# Patient Record
Sex: Female | Born: 1935 | Race: White | Hispanic: No | Smoking: Former smoker
Health system: Southern US, Community
[De-identification: ages and names within clinical notes are randomized; demographics above are authoritative.]

## PROBLEM LIST (undated history)

## (undated) DIAGNOSIS — K635 Polyp of colon: Secondary | ICD-10-CM

## (undated) DIAGNOSIS — M419 Scoliosis, unspecified: Secondary | ICD-10-CM

## (undated) DIAGNOSIS — I509 Heart failure, unspecified: Secondary | ICD-10-CM

## (undated) DIAGNOSIS — E785 Hyperlipidemia, unspecified: Secondary | ICD-10-CM

## (undated) DIAGNOSIS — K2970 Gastritis, unspecified, without bleeding: Secondary | ICD-10-CM

## (undated) DIAGNOSIS — K5790 Diverticulosis of intestine, part unspecified, without perforation or abscess without bleeding: Secondary | ICD-10-CM

## (undated) DIAGNOSIS — I10 Essential (primary) hypertension: Secondary | ICD-10-CM

## (undated) DIAGNOSIS — K449 Diaphragmatic hernia without obstruction or gangrene: Secondary | ICD-10-CM

## (undated) DIAGNOSIS — N952 Postmenopausal atrophic vaginitis: Secondary | ICD-10-CM

## (undated) DIAGNOSIS — F5101 Primary insomnia: Secondary | ICD-10-CM

## (undated) HISTORY — DX: Diaphragmatic hernia without obstruction or gangrene: K44.9

## (undated) HISTORY — DX: Essential (primary) hypertension: I10

## (undated) HISTORY — DX: Diverticulosis of intestine, part unspecified, without perforation or abscess without bleeding: K57.90

## (undated) HISTORY — PX: TOTAL HIP ARTHROPLASTY: SHX124

## (undated) HISTORY — DX: Primary insomnia: F51.01

## (undated) HISTORY — PX: JOINT REPLACEMENT: SHX530

## (undated) HISTORY — DX: Hyperlipidemia, unspecified: E78.5

## (undated) HISTORY — DX: Polyp of colon: K63.5

## (undated) HISTORY — DX: Gastritis, unspecified, without bleeding: K29.70

## (undated) HISTORY — PX: OTHER SURGICAL HISTORY: SHX169

## (undated) HISTORY — DX: Postmenopausal atrophic vaginitis: N95.2

## (undated) HISTORY — DX: Scoliosis, unspecified: M41.9

---

## 1998-07-26 HISTORY — PX: OTHER SURGICAL HISTORY: SHX169

## 1998-08-05 ENCOUNTER — Ambulatory Visit (HOSPITAL_BASED_OUTPATIENT_CLINIC_OR_DEPARTMENT_OTHER): Admission: RE | Admit: 1998-08-05 | Discharge: 1998-08-05 | Payer: Self-pay | Admitting: Orthopaedic Surgery

## 1998-09-18 ENCOUNTER — Encounter: Admission: RE | Admit: 1998-09-18 | Discharge: 1998-11-24 | Payer: Self-pay | Admitting: Family Medicine

## 1999-07-15 ENCOUNTER — Other Ambulatory Visit: Admission: RE | Admit: 1999-07-15 | Discharge: 1999-07-15 | Payer: Self-pay | Admitting: Family Medicine

## 2000-07-15 ENCOUNTER — Inpatient Hospital Stay (HOSPITAL_COMMUNITY): Admission: RE | Admit: 2000-07-15 | Discharge: 2000-07-16 | Payer: Self-pay | Admitting: Orthopaedic Surgery

## 2001-03-02 ENCOUNTER — Other Ambulatory Visit: Admission: RE | Admit: 2001-03-02 | Discharge: 2001-03-02 | Payer: Self-pay | Admitting: Family Medicine

## 2002-02-19 ENCOUNTER — Encounter (INDEPENDENT_AMBULATORY_CARE_PROVIDER_SITE_OTHER): Payer: Self-pay | Admitting: *Deleted

## 2002-02-19 ENCOUNTER — Ambulatory Visit (HOSPITAL_COMMUNITY): Admission: RE | Admit: 2002-02-19 | Discharge: 2002-02-19 | Payer: Self-pay | Admitting: Gastroenterology

## 2003-08-22 ENCOUNTER — Other Ambulatory Visit: Admission: RE | Admit: 2003-08-22 | Discharge: 2003-08-22 | Payer: Self-pay | Admitting: Family Medicine

## 2005-08-30 ENCOUNTER — Other Ambulatory Visit: Admission: RE | Admit: 2005-08-30 | Discharge: 2005-08-30 | Payer: Self-pay | Admitting: Family Medicine

## 2006-03-26 LAB — HM COLONOSCOPY

## 2006-12-01 ENCOUNTER — Encounter: Payer: Self-pay | Admitting: Cardiology

## 2007-01-09 ENCOUNTER — Ambulatory Visit: Admission: RE | Admit: 2007-01-09 | Discharge: 2007-01-09 | Payer: Self-pay | Admitting: Specialist

## 2007-01-25 HISTORY — PX: OTHER SURGICAL HISTORY: SHX169

## 2007-02-02 ENCOUNTER — Inpatient Hospital Stay (HOSPITAL_COMMUNITY): Admission: RE | Admit: 2007-02-02 | Discharge: 2007-02-08 | Payer: Self-pay | Admitting: Specialist

## 2008-05-01 ENCOUNTER — Encounter
Admission: RE | Admit: 2008-05-01 | Discharge: 2008-06-26 | Payer: Self-pay | Admitting: Physical Medicine and Rehabilitation

## 2008-08-13 ENCOUNTER — Encounter: Payer: Self-pay | Admitting: Cardiology

## 2008-12-31 ENCOUNTER — Encounter: Payer: Self-pay | Admitting: Cardiology

## 2009-01-27 ENCOUNTER — Encounter: Admission: RE | Admit: 2009-01-27 | Discharge: 2009-01-27 | Payer: Self-pay | Admitting: Neurological Surgery

## 2009-02-10 DIAGNOSIS — I10 Essential (primary) hypertension: Secondary | ICD-10-CM | POA: Insufficient documentation

## 2009-02-10 DIAGNOSIS — K219 Gastro-esophageal reflux disease without esophagitis: Secondary | ICD-10-CM | POA: Insufficient documentation

## 2009-02-11 ENCOUNTER — Ambulatory Visit: Payer: Self-pay | Admitting: Cardiology

## 2009-02-11 DIAGNOSIS — E785 Hyperlipidemia, unspecified: Secondary | ICD-10-CM

## 2009-02-20 ENCOUNTER — Telehealth: Payer: Self-pay | Admitting: Cardiology

## 2009-02-24 ENCOUNTER — Ambulatory Visit: Payer: Self-pay | Admitting: Cardiology

## 2009-03-02 LAB — CONVERTED CEMR LAB
BUN: 19 mg/dL (ref 6–23)
GFR calc non Af Amer: 101.75 mL/min (ref >60)
Potassium: 4.4 meq/L (ref 3.5–5.1)

## 2009-03-03 ENCOUNTER — Telehealth: Payer: Self-pay | Admitting: Cardiology

## 2009-03-11 ENCOUNTER — Telehealth (INDEPENDENT_AMBULATORY_CARE_PROVIDER_SITE_OTHER): Payer: Self-pay | Admitting: *Deleted

## 2009-04-02 ENCOUNTER — Ambulatory Visit (HOSPITAL_COMMUNITY): Admission: RE | Admit: 2009-04-02 | Discharge: 2009-04-03 | Payer: Self-pay | Admitting: Neurological Surgery

## 2009-05-05 ENCOUNTER — Encounter: Admission: RE | Admit: 2009-05-05 | Discharge: 2009-05-05 | Payer: Self-pay | Admitting: Neurological Surgery

## 2009-07-14 ENCOUNTER — Encounter: Admission: RE | Admit: 2009-07-14 | Discharge: 2009-07-14 | Payer: Self-pay | Admitting: Neurological Surgery

## 2009-11-20 ENCOUNTER — Ambulatory Visit (HOSPITAL_COMMUNITY): Admission: RE | Admit: 2009-11-20 | Discharge: 2009-11-20 | Payer: Self-pay | Admitting: Ophthalmology

## 2009-12-23 ENCOUNTER — Encounter: Admission: RE | Admit: 2009-12-23 | Discharge: 2010-03-23 | Payer: Self-pay | Admitting: Family Medicine

## 2009-12-25 ENCOUNTER — Ambulatory Visit (HOSPITAL_COMMUNITY): Admission: RE | Admit: 2009-12-25 | Discharge: 2009-12-25 | Payer: Self-pay | Admitting: Ophthalmology

## 2010-07-11 LAB — HEMOGLOBIN AND HEMATOCRIT, BLOOD
HCT: 35.8 % — ABNORMAL LOW (ref 36.0–46.0)
Hemoglobin: 12 g/dL (ref 12.0–15.0)

## 2010-07-11 LAB — BASIC METABOLIC PANEL
CO2: 30 mEq/L (ref 19–32)
Calcium: 9.8 mg/dL (ref 8.4–10.5)
GFR calc non Af Amer: >60 mL/min (ref >60)
Sodium: 129 mEq/L — ABNORMAL LOW (ref 135–145)

## 2010-07-12 ENCOUNTER — Encounter: Payer: Self-pay | Admitting: Family Medicine

## 2010-07-12 DIAGNOSIS — I10 Essential (primary) hypertension: Secondary | ICD-10-CM | POA: Insufficient documentation

## 2010-07-12 DIAGNOSIS — M419 Scoliosis, unspecified: Secondary | ICD-10-CM | POA: Insufficient documentation

## 2010-07-12 DIAGNOSIS — K635 Polyp of colon: Secondary | ICD-10-CM | POA: Insufficient documentation

## 2010-07-12 DIAGNOSIS — F5101 Primary insomnia: Secondary | ICD-10-CM | POA: Insufficient documentation

## 2010-07-12 DIAGNOSIS — K449 Diaphragmatic hernia without obstruction or gangrene: Secondary | ICD-10-CM | POA: Insufficient documentation

## 2010-07-12 DIAGNOSIS — K219 Gastro-esophageal reflux disease without esophagitis: Secondary | ICD-10-CM | POA: Insufficient documentation

## 2010-07-12 DIAGNOSIS — R109 Unspecified abdominal pain: Secondary | ICD-10-CM | POA: Insufficient documentation

## 2010-07-12 DIAGNOSIS — K2970 Gastritis, unspecified, without bleeding: Secondary | ICD-10-CM | POA: Insufficient documentation

## 2010-07-12 DIAGNOSIS — R5383 Other fatigue: Secondary | ICD-10-CM | POA: Insufficient documentation

## 2010-07-12 DIAGNOSIS — K5790 Diverticulosis of intestine, part unspecified, without perforation or abscess without bleeding: Secondary | ICD-10-CM

## 2010-07-12 DIAGNOSIS — N952 Postmenopausal atrophic vaginitis: Secondary | ICD-10-CM | POA: Insufficient documentation

## 2010-07-28 LAB — CBC
HCT: 38 % (ref 36.0–46.0)
MCHC: 34.1 g/dL (ref 30.0–36.0)
MCV: 92.6 fL (ref 78.0–100.0)
RDW: 12.8 % (ref 11.5–15.5)

## 2010-07-28 LAB — DIFFERENTIAL
Basophils Relative: 0 % (ref 0–1)
Lymphocytes Relative: 29 % (ref 12–46)
Lymphs Abs: 1.5 10*3/uL (ref 0.7–4.0)
Monocytes Absolute: 0.5 10*3/uL (ref 0.1–1.0)
Monocytes Relative: 10 % (ref 3–12)
Neutro Abs: 3.1 10*3/uL (ref 1.7–7.7)
Neutrophils Relative %: 60 % (ref 43–77)

## 2010-07-28 LAB — PROTIME-INR: INR: 0.93 (ref 0.00–1.49)

## 2010-07-28 LAB — BASIC METABOLIC PANEL
BUN: 19 mg/dL (ref 6–23)
CO2: 28 mEq/L (ref 19–32)
Chloride: 95 mEq/L — ABNORMAL LOW (ref 96–112)
Creatinine, Ser: 0.58 mg/dL (ref 0.4–1.2)

## 2010-07-28 LAB — APTT: aPTT: 30 seconds (ref 24–37)

## 2010-08-31 ENCOUNTER — Encounter: Payer: Self-pay | Admitting: Internal Medicine

## 2010-08-31 ENCOUNTER — Ambulatory Visit (INDEPENDENT_AMBULATORY_CARE_PROVIDER_SITE_OTHER): Payer: Medicare Other | Admitting: Internal Medicine

## 2010-08-31 DIAGNOSIS — J45909 Unspecified asthma, uncomplicated: Secondary | ICD-10-CM | POA: Insufficient documentation

## 2010-08-31 MED ORDER — PREDNISONE (PAK) 10 MG PO TABS: ORAL_TABLET | ORAL | Status: AC

## 2010-08-31 MED ORDER — MOMETASONE FURO-FORMOTEROL FUM 200-5 MCG/ACT IN AERO: INHALATION_SPRAY | RESPIRATORY_TRACT | Status: DC

## 2010-08-31 NOTE — Progress Notes (Signed)
  Subjective:    Patient ID: [name], female    DOB: July 16, 1924, 77 y.o.   MRN: [id number]  HPI 17 yowf quit smoking as teenager and no adverse effects to her knowledge with dx of allergic rhinits/asthma by [facility] then Nathalie on shots since the 1990's  ? Benefit?   08/31/2010 Initial pulmonary office eval in [place] office with persistent adult onset (in her 30s) recurrent cough/ wheeze intermittent at least  Twice  A year , esp spring and fall or with colds,  Some  better with saba  Only Use intermittently with no need for rx between flares  but this episode started 4 weeks prior ot 1st pulmonary ov eval by Gene Renfrow  rx prednisone transiently improved but then worse again wit doe x adls and noct cough/ wheeze. No purulent sputum  Pt denies any significant sore throat, dysphagia, itching, sneezing,  nasal congestion or excess/ purulent secretions,  fever, chills, sweats, unintended wt loss, pleuritic or exertional cp, hempoptysis, orthopnea pnd or leg swelling.    Also denies any obvious fluctuation of symptoms with weather or environmental changes or other aggravating or alleviating factors.     Review of Systems     Objective:   Physical Exam    elderly wf < stated age Wt 132 08/31/2010  HEENT mild turbinate edema.  Oropharynx no thrush or excess pnd or cobblestoning.  No JVD or cervical adenopathy. Mild accessory muscle hypertrophy. Trachea midline, nl thryroid. Chest was hyperinflated by percussion with diminished breath sounds and moderate increased exp time without wheeze. Hoover sign positive at mid inspiration. Regular rate and rhythm without murmur gallop or rub or increase P2 or edema.  Abd: no hsm, nl excursion. Ext warm without cyanosis or clubbing.     Cbc 08/31/10 no eos Assessment & Plan:

## 2010-08-31 NOTE — Patient Instructions (Signed)
Start dulera 200 2 puffs every 12 hours whenever having any cough and wheeze or short  Work on inhaler technique:  relax and gently blow all the way out then take a nice smooth deep breath back in, triggering the inhaler at same time you start breathing in.  Hold for up to 5 seconds if you can.  Rinse and gargle with water when done   If your mouth or throat starts to bother you,   I suggest you time the inhaler to your dental care and after using the inhaler(s) brush teeth and tongue with a baking soda containing toothpaste and when you rinse this out, gargle with it first to see if this helps your mouth and throat.     Take omeprazole Take 30- 60 min before your first and last meals of the day and follow the diet  GERD (REFLUX)  is an extremely common cause of respiratory symptoms, many times with no significant heartburn at all.    It can be treated with medication, but also with lifestyle changes including avoidance of late meals, excessive alcohol, smoking cessation, and avoid fatty foods, chocolate, peppermint, colas, red wine, and acidic juices such as orange juice.  NO MINT OR MENTHOL PRODUCTS SO NO COUGH DROPS  USE SUGARLESS CANDY INSTEAD (jolley ranchers or Stover's)  NO OIL BASED VITAMINS   Prednisone 10 mg take  4 each am x 2 days,   2 each am x 2 days,  1 each am x2days and stop

## 2010-08-31 NOTE — Assessment & Plan Note (Addendum)
DDX of  difficult airways managment all start with A and  include Adherence, Ace Inhibitors(already established she can't use them), Acid Reflux, Active Sinus Disease, Alpha 1 Antitripsin deficiency, Anxiety masquerading as Airways dz,  ABPA,  allergy(esp in young), Aspiration (esp in elderly), Adverse effects of DPI,  Active smokers, plus two Bs  = Bronchiectasis and Beta blocker use..and one C= CHF   In this case Adherence is the biggest issue and starts with  inability to use HFA effectively and also  understand that SABA treats the symptoms but doesn't get to the underlying problem (inflammation).  I used  the analogy of putting steroid cream on a rash to help explain the meaning of topical therapy and the need to get the drug to the target tissue.   The proper method of use, as well as anticipated side effects, of this metered-dose inhaler are discussed and demonstrated to the patient. This improved from < 25% to 50% at best and needs work, lots of it  ? Acid reflux >  Reviewed ppi timing/ diet  ? Allergies> really seriously doubt shots are helping after 20 years and may be able to stop them if can just teach effective topical rx.  Will need close f/u and short course of prednisone to get over this exacerbation.  Strongly doubt copd here

## 2010-09-08 NOTE — Op Note (Signed)
[name], [name]              ACCOUNT NO.:  [id number]   MEDICAL RECORD NO.:  [id number]          PATIENT TYPE:  INP   LOCATION:  1608                         FACILITY:  [facility]   PHYSICIAN:  [name], M.D.DATE OF BIRTH:  07/21/1924   DATE OF PROCEDURE:  02/02/2007  DATE OF DISCHARGE:                               OPERATIVE REPORT   PREOPERATIVE DIAGNOSIS:  Right knee end-stage osteoarthritis.   POSTOPERATIVE DIAGNOSIS:  Right knee end-stage osteoarthritis.   PROCEDURE:  Right total knee arthroplasty.   SURGEON:  [name], M.D.   ASSISTANT:  Leilani Able, PA-C.   ANESTHESIA:  Spinal with monitored anesthesia care.   ESTIMATED BLOOD LOSS:  Less 100 mL.   DRAINS:  One medium Hemovac.   COMPLICATIONS:  None.   TOURNIQUET TIME:  75 minutes at 300 mmHg.   DISPOSITION:  PACU stable.   OPERATIVE IMPLANTS:  [employer], [employer], [place].  Size 3 femur,  size 3 tibia, 12.5-mm posterior stabilized rotating platform tibial  insert.  35-mm all polyethylene patella.  All cemented.   OPERATIVE DETAILS:  The patient was counseled in the holding area.  The  correct site was identified.  IV was started.  She was taken to the  operating room and placed under monitored anesthesia care where a spinal  was administered.  Utilizing sterile technique by the OR circulating  nurse, the Foley catheter was placed.  All extremities were well-padded.  The right knee was examined.  She had 5-degree flexion contracture.  She  could flex to 120 degrees.  She was elevated, prepped with DuraPrep and  draped in a sterile fashion.  It was exsanguinated with an Esmarch, and  the tourniquet was inflated to 300 mmHg.  She had some previous  abrasions on the anterior aspect of her knee. The anterior midline  incision was made, avoiding these.  Dissection of the skin and the  subcutaneous tissue.  Medial and lateral soft tissue flaps were  developed.  Medial parapatellar  arthrotomy was performed.  Proximal  medial soft tissue release was slightly done.  End-stage arthritis  changes, bone against bone, more so in the lateral compartment.  The  cruciate ligaments were resected.  Starter hole made in the distal  femur.  The canal was irrigated.  The effluent was clear.  Intramedullary rod  was gently placed.  I chose a 5-degree valgus cut  and took a 10-mm cut off the distal femur.  The distal femur was found  be a size #3.  Rotation marks were made and cut to fit a size #3.  Proximal tibia eminence was resected.  Medial and lateral menisci  removed.  The geniculate vessels were coagulated.  The posterior  neurovascular structures were thought of and protected throughout the  entire case.  The tibia was found to be a size 3.  The central aspect  was identified.  Reaming and step reaming was performed.  Rod was  placed.  I took an 8-mm cut off the lateral side which appeared to be  about 2-mm of defect.  This was done at a  0-degree slope.  Posteromedial  and posterolateral femoral osteophytes were removed.  At this time, the  flexion-extension blocks with 12.5 were well-balanced.  The knee was  then flexed.  Tibial base plate was applied.  Rotation __________ were  set, reamer and punch were performed.  The femoral box cut was then  performed.  At this time, a size 3 tibia, size 3 femur, 12.5-mm tibial  insert with excellent motion, soft tissue balance.  Patella tracked it  was anatomic.  Patella was found be a size 35.  __________ bone was  removed and locking holes were made.  At this time, with pulsatile  lavage, the knee was copiously irrigated.  Utilizing Moder cement  technique, all components were cemented in place, size 3 tibia, size 3  femur, 35 patella.  After the cement had cured, we did a 12.5 trial  insert, and we had excellent range of motion and soft tissue balance in  varus and valgus stress.  The trials were removed.  Excess cement was   removed.  Again, the knee was irrigated.  The final 12.5-mm posterior  stabilized rotating platform tibial insert was implanted.  A medium  Hemovac drain was placed.  Sequential closure of layers were done.  The  arthrotomy was closed with Vicryl, subcu Vicryl, skin with subcuticular  Monocryl suture.  Steri-Strips were applied.  Drain hooked to suction.  Sterile dressing applied.  The tourniquet was deflated.  Another gram of  Ancef was given intravenously.  No complications or problems.  Sponge  and needle count were correct.  She had excellent circulation in the  foot and ankle at the end of the case.  She was taken out of the  operating room in stable condition.   To help with surgical decision making and technique, Mr. Leilani Able,  PA-C assisted me throughout the entire case.           ______________________________  [name], M.D.     RAC/MEDQ  D:  02/02/2007  T:  02/03/2007  Job:  [id number]

## 2010-09-08 NOTE — Discharge Summary (Signed)
NAMESHARISSA, [name]              ACCOUNT NO.:  [id number]   MEDICAL RECORD NO.:  [id number]          PATIENT TYPE:  INP   LOCATION:  1608                         FACILITY:  [facility]   PHYSICIAN:  [name], M.D.DATE OF BIRTH:  March 24, 1925   DATE OF ADMISSION:  02/02/2007  DATE OF DISCHARGE:  02/07/2007                               DISCHARGE SUMMARY   ADMISSION DIAGNOSIS:  End-stage osteoarthritis, right knee.   DISCHARGE DIAGNOSES:  1. End-stage osteoarthritis, right knee.  2. Hyponatremia.  3. Acute postoperative blood loss anemia.   PROCEDURE:  Total knee arthroplasty, right knee.   HISTORY OF PRESENT ILLNESS:  This is a 77 year old lady with a history  of end-stage osteoarthritis of her right knee that has failed  conservative management.  Due to her continued pain and difficulty, she  is now scheduled for total knee arthroplasty.  The surgery, risks,  benefits and aftercare were discussed in detail with the patient,  questions provided and answered and surgery to go ahead as scheduled.   LABORATORY DATA AND X-RAY FINDINGS:  Admission CBC showed hematocrit  slightly low at 35.9, monocytes high at 12 and otherwise normal.  Admission CMET showed sodium low at 133, otherwise within normal limits.  PT/PTT within normal limits.  Urinalysis within normal limits.  Hemoglobin and hematocrit reached a low of 7.5 and 21.4 on October 11,  and she was transfused 2 units of packed cells and on October 13, was  back up to 9.9 and 28.7.  On October 12, she was hyponatremic at 129 and  hypochloremic at 81.  She was put on fluid restriction and on October  13, was back up to 132 for a sodium with chloride of 93.   HOSPITAL COURSE:  The patient tolerated the operative procedure well.  On postop day #1, vital signs were stable and afebrile with O2  saturations 97% on 2 L.  I's & O's good, but diuresing significantly.  Hemoglobin was 9.5, hematocrit 27.8.  Glucose was 189 with  potassium  5.5.  This was repeated and found to be normal.  This was probably a  hemolyzed specimen.  Her drain was removed and her CPM and physical  therapy were started postop day #2.  Vital signs were stable.  She was  afebrile.  Calves were negative and wound was benign.  Her hemoglobin  was down to 7.9 and she was transfused 2 units of packed cells.  On  postop day #3, vital signs were stable.  She was afebrile.  Her dressing  was changed and her wound was benign.  She was put on fluid restrictions  for her hyponatremia and plans were made for nursing home placement.  On  postop day #4, vital signs were stable and she was afebrile.  Lungs were  clear.  Heart sounds normal.  Bowel sounds active.  Calves were negative  and wound was benign.  Repeat labs showed her sodium to be back up to  132.  Plans were made for discharge the following morning pending stable  vital signs.   CONDITION ON DISCHARGE:  Improved.  DISCHARGE MEDICATIONS:  1. Metoprolol 50 mg b.i.d.  2. Nexium 40 mg q.a.m.  3. Mucinex 600 mg one at night.  4. Calcium 600 mg plus vitamin D one daily.  5. Albuterol inhaler p.r.n.  6. Allergy shots every other Monday.  7. Robaxin 500 mg one p.o. q.8h. p.r.n. spasm.  8. Senokot one p.o. nightly.  9. Norco 5/325 one to two q.4-6 h. p.r.n. pain.  10.Ferrous sulfate 325 mg p.o. t.i.d.  11.Lovenox 30 mg one shot q.12 h. at 7 a.m. and 7 p.m. until February 12, 2007, at which time the Lovenox should stop.   CONDITION ON DISCHARGE:  Improved.   ACTIVITY:  She should be doing physical therapy for assisted ambulation  and range of motion of her knees.  She should do CPM machine 0-60  degrees 8 hours a day increasing by 5 degrees per day.  She can  weightbear as tolerated.   WOUND CARE:  She should keep her wound clean and dry x3 weeks.   FOLLOW UP:  Follow up in the office in 2 weeks.  Please call on the day  of discharge to set up appointment to see Dr. Thomasena Edis in 2  weeks.      [name], [name]    ______________________________  [name], M.D.    SJC/MEDQ  D:  02/06/2007  T:  02/06/2007  Job:  [id number]

## 2010-09-08 NOTE — H&P (Signed)
[name], [name]              ACCOUNT NO.:  [id number]   MEDICAL RECORD NO.:  [id number]          PATIENT TYPE:  INP   LOCATION:  NA                           FACILITY:  [facility]   PHYSICIAN:  [name], M.D.DATE OF BIRTH:  07-20-1924   DATE OF ADMISSION:  02/02/2007  DATE OF DISCHARGE:                              HISTORY & PHYSICAL   ADMISSION DIAGNOSIS:  End-stage osteoarthritis, right knee.   HISTORY OF PRESENT ILLNESS:  This is a 77 year old lady with a history  of end-stage osteoarthritis of her right knee which has failed  conservative treatment to alleviate her pain.  Due to continued pain and  difficulty, she now requests total knee arthroplasty.  She has had  medical clearance from her medical doctor and the surgery, risk,  benefits and aftercare were discussed in detail with the patient and her  daughter.  Questions invited and answered and surgery to go ahead as  scheduled.   ALLERGIES:  No known drug allergies.   MEDICATIONS:  1. Nexium 40 mg one daily.  2. Metoprolol 50 mg one b.i.d.   PAST MEDICAL HISTORY:  1. Gastroesophageal reflux disease.  2. Hypertension.   PAST SURGICAL HISTORY:  Shoulder surgery.   SOCIAL HISTORY:  The patient lives at home.  She is single.  She does  not smoke and drinks rarely.   FAMILY HISTORY:  Positive for heart disease, CVA and cancer.   REVIEW OF SYSTEMS:  CENTRAL NERVOUS SYSTEM:  Negative for headache,  blurred vision or dizziness.  PULMONARY:  No shortness breath, PND,  orthopnea.  CARDIOVASCULAR:  No chest pain or palpitations.  GI:  Positive for GERD.  Negative for ulcers or hepatitis.  GU: Negative for  urinary tract difficulty.  MUSCULOSKELETAL:  Positive as in HPI.   HISTORY OF PRESENT ILLNESS:  VITAL SIGNS:  BP 150/88, respirations 16,  pulse 64 and regular.  GENERAL:  This is a well-developed, well-nourished lady in no acute  distress.  HEENT:  Head normocephalic.  Nose patent.  Ears patent.  Pupils  equal,  round, reactive to light.  Throat without injection.  NECK:  Supple without adenopathy.  Carotids 2+ without bruit.  CHEST:  Clear to auscultation.  No rales or rhonchi.  Respirations 16.  HEART:  Regular rate and rhythm at 64 beats per minute without murmur.  ABDOMEN:  Soft, active bowel sounds.  No masses, organomegaly.  NEUROLOGIC:  The patient is alert and oriented to time, place and  person.  Cranial nerves 2-12 grossly intact.  EXTREMITIES:  The right knee with 0- to 135-degree range of motion,  positive crepitation throughout the range of motion.  NEUROLOGIC:  Dorsalis pedis and posterior tibialis pulses are 2+.  She  has a healing abrasion on the anterior knee without evidence of  infection.   LABORATORY DATA AND X-RAY FINDINGS:  X-rays show end-stage  osteoarthritis of right knee.   IMPRESSION:  End-stage osteoarthritis, right knee.   PLAN:  Total knee arthroplasty, right knee.      [name], [name]    ______________________________  [name], M.D.  SJC/MEDQ  D:  01/23/2007  T:  01/24/2007  Job:  [id number]

## 2010-09-11 NOTE — Discharge Summary (Signed)
[name], [name]              ACCOUNT NO.:  [id number]   MEDICAL RECORD NO.:  [id number]          PATIENT TYPE:  INP   LOCATION:  1608                         FACILITY:  [facility]   PHYSICIAN:  [name], M.D.DATE OF BIRTH:  1924-10-11   DATE OF ADMISSION:  02/02/2007  DATE OF DISCHARGE:  02/08/2007                               DISCHARGE SUMMARY   ADMITTING DIAGNOSIS:  End-stage osteoarthritis, right knee.   DISCHARGE DIAGNOSIS:  End-stage osteoarthritis, right knee.   OPERATION:  Total knee arthroplasty, right knee.   BRIEF HISTORY:  This is a 81-year lady with a history end-stage  osteoarthritis right knee that failed conservative treatment to  alleviate her pain and discomfort due to continued difficulties with  daily activities.  She is scheduled for total knee arthroplasty.  Surgery risks, benefits and aftercare were discussed in detail with the  patient.  Questions invited and answered.  She had medical clearance  from Dr. Rudi Heap, her family doctor, prior to surgery.  Surgery  will go ahead as scheduled.   LABORATORY VALUES:  Admission CBC within normal limits with the  exception of slightly low hematocrit at 35.9.  Hemoglobin/hematocrit  reached a low of 7.5 and 21.4.  On October 11, she was transfused and  was back up to 9.9 and 28.7 by discharge.  PT/PTT within normal limits.  She ran mildly low sodium through admission at 129 and up to 132 at  discharge.  Glucose was elevated throughout admission from the 138-189  range and was 108 at discharge.  Urinalysis was normal.   COURSE IN THE HOSPITAL:  The patient tolerated the operative procedure  well.  Postoperative day #1 , vital signs were stable.  She was  afebrile.  Hemoglobin/hematocrit were at acceptable levels.  Lungs were  clear.  Heart sounds normal.  Bowel sounds active.  Calves negative.  Dressing was dry.  Drain was removed without difficulty, and CPM and  therapy were started.   Postoperative day #2 vital signs were stable.  She was afebrile.  Dressing was changed.  Wound was benign.  Hemoglobin/hematocrit had dropped below 8 and 22.  She was transfused 2  units.  Her calves were negative, and she continued with physical  therapy.  Postoperative day #3, vital signs were stable.  She was  afebrile.  She was slightly hyponatremic.  She was put on free water  restriction for that.  Her dressing was changed.  Her wound was benign.  Calves were soft and negative.  Plans were made for discharge to skilled  nursing facility the following day or two.  Postoperative day #4, she  was feeling better.  Vital signs stable, afebrile.  Lungs were clear.  Heart sounds normal.  Bowel sounds active.  Calves were negative.  Wound  was benign, and continued awaiting nursing home placement.  On  postoperative day #5, she continued do well.  Vital signs stable.  She  was afebrile.  Lungs clear.  Heart sounds normal.  Bowel sounds active.  Calves negative.  Wound was benign.  Hyponatremia had resolved and  nursing facility was found  and the patient was subsequently discharged  to skilled nursing facility.   CONDITION ON DISCHARGE:  Improved.   DISCHARGE MEDICATIONS:  1. Norco 5/325 one to two q. 4-6 h p.r.n. pain.  2. Robaxin 500 one p.o. q.8 h p.r.n. spasm.  3. Trinsicon one b.i.d. for anemia.  4. Lovenox 30 mg one shot q.12 h for total of 10 days postop.   DISCHARGE INSTRUCTIONS:  1. She is to be weight bear as tolerated.  Do a range of motion and      CPM daily.  Ambulate with a walker.  2. Follow-up in the office 2 weeks with Dr. Thomasena Edis.  3. Discharge diet regular.      [name], [name]    ______________________________  [name], M.D.    SJC/MEDQ  D:  03/01/2007  T:  03/02/2007  Job:  [id number]

## 2010-09-11 NOTE — Op Note (Signed)
[place]. [facility]  Patient:    [name], [name]                       MRN: [id number] Proc. Date: 07/15/00 Adm. Date:  [id number] Attending:  Jacki Cones                           Operative Report  PREOPERATIVE DIAGNOSIS:  Right recurrent rotator cuff tear.  POSTOPERATIVE DIAGNOSIS:  Right recurrent rotator cuff tear.  PROCEDURE:  Right shoulder exploration and subacromial bursectomy, repeat acromioplasty, and partial rotator cuff resection.  SURGEON:  Mark C. Ophelia Charter, M.D.  ASSISTANT:  Humberto Leep. Wyonia Hough, M.D.  ANESTHESIA:  Scalene block plus GOT.  ESTIMATED BLOOD LOSS:  150 ml.  DESCRIPTION OF PROCEDURE:  After induction of general anesthesia with preoperative scalene block preoperative Ancef prophylaxis, standard Duraprep to the shoulder as well as the iliac crest with a Sure-Foot to help block the heel with flexion and internal rotation, both the right thigh and shoulder region and arm to the wrist were prepped with Duraprep.  The thigh was squared off with towels, Betadine Vi-Drape applied for planned iliotibial graft if necessary.  Shoulder had stockinette applied with Coban and a Betadine Vi-Drape.  Usual split sheet and drapes were applied.  Old incision was opened.  Vicryl sutures placed 1.5 cm off the tip of the acromion in the deltoid split to prevent further migration.  Anterior portion of the deltoid was released with the Bovie from the acromion. Immediately the humeral head was directly visualized.  There was granulation tissue present, which was debrided, and hypertrophic bursa fluid, which was resected.  There was some anterior cuff present with the low portion of the subscapularis still attached.  Supraspinatus was completely absent.  Posterior portion of the cuff was also gone.  Some bursa resected, and the tip of the cuff was identified back posterior to the glenoid rim.  This was grasped, a suture placed in it, and for  20-30 minutes finger dissection performed in all portions of the cuff to attempt to mobilize the cuff.  The cuff was stuck down and despite dissection, tissue was poor.  There was no _____.  The posterior aspect of the anterior cuff was released from some scar tissue bands between it and the overlying deltoid and pectoralis muscle.  With the arm abducted, there was no area of any remaining attachment of the rotator cuff to the greater tuberosity at the posterior aspect.  Repeat acromioplasty was performed using a three-quarter inch osteotome and a rasp, and there was some tissue stuck to the undersurface of the acromion, which was thick and hypertrophic and was wispy and with a small amount of pulling would tear. Initially this was thought to possibly be part of the rotator cuff, but as it was mobilized it was obvious that it was some granulation tissue that was present mixed with bursal tissue and not cuff.  Biceps tendon was gone.  The old area where the rotator cuff had been advanced and repaired on the articular cartilage with the area of bone that had been roughened up was visualized.  Labrum was inspected with slight distraction of the arm, and there was no obvious labral tear.  Continued mobilization was performed in an attempt to help to advance the rotator cuff, but it became obvious that over the anterior portion of the rotator cuff had some attachment and that it was  significantly limited in its excursion and that the supraspinatus and all aspects of the posterior cuff were gone, extremely retracted, and would not mobilize more than a few millimeters past the point of the rim of the glenoid. All hypertrophic tissue had been cut loose.  Complete bursectomy was performed.  Undersurface of the acromion was thinned a little further, polished down with a rasp, the distal aspect of the clavicle trimmed with a rongeur, filed down smooth, and after irrigation the deltoid was  re-repaired to the acromion.  Vicryl 2-0 was put in the subcutaneous tissue, skin staple closure, postoperative dressing and shoulder immobilizer.  The patient tolerated the procedure well. DD:  07/15/00 TD:  07/16/00 Job: [id number]

## 2010-09-11 NOTE — Op Note (Signed)
[name], [name]                          ACCOUNT NO.:  [id number]   MEDICAL RECORD NO.:  [id number]                   PATIENT TYPE:  AMB   LOCATION:  ENDO                                 FACILITY:  [facility]   PHYSICIAN:  Petra Kuba, M.D.                 DATE OF BIRTH:  07-06-1924   DATE OF PROCEDURE:  02/19/2002  DATE OF DISCHARGE:                                 OPERATIVE REPORT   PROCEDURE:  Esophagogastroduodenoscopy with biopsy.   INDICATIONS:  Patient with long-standing reflux.  Want to proceed with a one-  time endoscopy to rule out Barrett's or other abnormalities.  Consent was  signed after risks, benefits, methods, and options thoroughly discussed in  the office.   MEDICATIONS:  Additional medicines for this procedure:  Demerol 20 mg,  Versed 2 mg.   DESCRIPTION OF PROCEDURE:  The video endoscope was inserted by direct  vision.  Esophagus was normal.  There was no sign of esophagitis or  Barrett's; however, in the distal esophagus a small to medium-sized hiatal  hernia was seen with a widely patent ring.  The scope passed easily into the  stomach, advanced through a normal pylorus to a normal duodenal bulb and  around the C-loop to a normal second portion of the duodenum.  The scope was  then slowly withdrawn, and again a good look at the bulb was normal.  The  scope was withdrawn back to the stomach and retroflexed.  Angularis, cardia,  fundus, lesser and greater curve were normal except for some moderate distal  gastritis and antritis.  A few scattered biopsies were obtained.  Air was  suctioned, the scope was slowly withdrawn.  Again a good look at the  esophagus on slow withdrawal was normal.  Not mentioned above, straight  visualization of the stomach was done, which ruled out any other additional  findings.  The scope was removed again, and a good look at the esophagus was  normal.  The scope was removed.  The patient tolerated the procedure well.  There was  no obvious immediate complication.   ENDOSCOPIC DIAGNOSES:  1. Small to medium-sized hiatal hernia with a widely patent ring.  2. Moderate antritis, distal gastritis, status post biopsy.  3. Otherwise normal EGD.   PLAN:  Continue her Nexium, as that seems to be helping.  Await pathology.  Return care to Dr. Christell Constant for the customary health care maintenance to  include yearly rectals and guaiacs.  I will be on standby to help p.r.n.                                               Petra Kuba, M.D.    MEM/MEDQ  D:  02/19/2002  T:  02/19/2002  Job:  [id number]  cc:   Ernestina Penna, M.D.  [address] [place]  [place] [address]  Fax: [phone]

## 2010-09-11 NOTE — Op Note (Signed)
[name], [name]                          ACCOUNT NO.:  [id number]   MEDICAL RECORD NO.:  [id number]                   PATIENT TYPE:  AMB   LOCATION:  ENDO                                 FACILITY:  [facility]   PHYSICIAN:  Petra Kuba, M.D.                 DATE OF BIRTH:  1924-07-24   DATE OF PROCEDURE:  02/19/2002  DATE OF DISCHARGE:                                 OPERATIVE REPORT   PROCEDURE PERFORMED:  Colonoscopy with polypectomy.   INDICATIONS FOR PROCEDURE:  Screening.  Consent was signed after risks,  benefits, methods, and options were thoroughly discussed in the office.   MEDICINES USED:  Demerol 50, Versed 5.   DESCRIPTION OF PROCEDURE:  Rectal inspection was pertinent for external  hemorrhoids, small.  Digital exam was negative.  Video pediatric adjustable  colonoscope was inserted and advanced around the colon to the cecum.  This  did require rolling her on her back with some abdominal pressure on  insertion.  No significant abnormalities were seen.  The cecum was  identified by the appendiceal orifice and the ileocecal valve.  Unfortunately the prep on the right side was only fair.  There was still  adherent stool on to the wall of both the cecum, ascending and hepatic  flexure which could not be washed off but no obvious lesions were seen, as  we slowly withdrew.  On slow withdraw through the colon, there was adequate  preparation of the rest of the colon.  The transverse and the majority of  the descending were normal.  There was some scattered left sided  diverticula.  In the rectum two tiny polyps were seen and were hot biopsied.  The scope was retroflexed revealing some internal hemorrhoids.  Scope was  straightened and readvanced a short ways up the left side of the colon.  Air  was suctioned and scope removed.  The patient tolerated the procedure well.  There was no obvious immediate complication.   ENDOSCOPIC DIAGNOSES:  1. Internal and external  hemorrhoids.  2. Two tiny rectal polyps, hot biopsied.  3. Left sided occasional diverticula, mostly in the sigmoid.  4. Otherwise within normal limits to the cecum with some adherent stool in     the cecum, ascending and hepatic flexure which was unable to be washed     off.    PLAN:  Will await pathology to determine further colonic screening.  May  want to proceed a little sooner than normal because of the prep on the right  side.  Yearly rectals and guaiacs per Dr. Christell Constant.  Continue workup with a one  time EGD.  Petra Kuba, M.D.    MEM/MEDQ  D:  02/19/2002  T:  02/19/2002  Job:  [id number]   cc:   Ernestina Penna, M.D.  [address] [place]  [place] [address]  Fax: [phone]

## 2010-09-29 ENCOUNTER — Telehealth: Payer: Self-pay | Admitting: *Deleted

## 2010-09-29 NOTE — Telephone Encounter (Signed)
LMTCBx1 to set appt in [place]. Carron Curie, CMA

## 2010-09-30 NOTE — Telephone Encounter (Signed)
LMTCBx1.Kaizer Dissinger, CMA  

## 2010-09-30 NOTE — Telephone Encounter (Signed)
Pt returned call. Says she is better now and doesn't want to make an appt to see dr wert at this time but will call when she feels she needs to be seen. [name]

## 2011-02-03 LAB — PROTIME-INR
INR: 1
Prothrombin Time: 13.2

## 2011-02-04 LAB — BASIC METABOLIC PANEL
BUN: 12
BUN: 12
BUN: 7
CO2: 29
CO2: 31
Calcium: 8.7
Calcium: 8.7
Calcium: 8.7
Calcium: 8.8
Creatinine, Ser: 0.44
Creatinine, Ser: 0.49
Creatinine, Ser: 0.53
Creatinine, Ser: 0.79
Creatinine, Ser: 0.82
GFR calc Af Amer: >60
GFR calc Af Amer: >60
GFR calc Af Amer: >60
GFR calc non Af Amer: >60
GFR calc non Af Amer: >60
GFR calc non Af Amer: >60
GFR calc non Af Amer: >60
Glucose, Bld: 108 — ABNORMAL HIGH
Glucose, Bld: 169 — ABNORMAL HIGH
Glucose, Bld: 189 — ABNORMAL HIGH
Potassium: 5.5 — ABNORMAL HIGH

## 2011-02-04 LAB — URINALYSIS, ROUTINE W REFLEX MICROSCOPIC
Bilirubin Urine: NEGATIVE
Hgb urine dipstick: NEGATIVE
Ketones, ur: NEGATIVE
Nitrite: NEGATIVE
Nitrite: NEGATIVE
Specific Gravity, Urine: 1.011
Urobilinogen, UA: 0.2
Urobilinogen, UA: 0.2

## 2011-02-04 LAB — PROTIME-INR
INR: 1
INR: 1
INR: 1.1
INR: 1
Prothrombin Time: 13.5
Prothrombin Time: 13.6
Prothrombin Time: 14
Prothrombin Time: 13

## 2011-02-04 LAB — DIFFERENTIAL
Basophils Absolute: 0
Eosinophils Absolute: 0.1
Eosinophils Absolute: 0.1
Eosinophils Relative: 1
Lymphocytes Relative: 25
Lymphs Abs: 1.7
Monocytes Absolute: 0.8 — ABNORMAL HIGH
Monocytes Relative: 12 — ABNORMAL HIGH
Neutrophils Relative %: 61

## 2011-02-04 LAB — CBC
HCT: 27.8 — ABNORMAL LOW
Hemoglobin: 9.5 — ABNORMAL LOW
MCHC: 34.3
MCHC: 34.6
MCHC: 35
MCHC: 34.2
MCV: 90.7
MCV: 90.5
Platelets: 167
Platelets: 218
RBC: 3.19 — ABNORMAL LOW
RBC: 3.96
RDW: 13.2
RDW: 13.9
RDW: 13.2
WBC: 8.4
WBC: 7.4

## 2011-02-04 LAB — COMPREHENSIVE METABOLIC PANEL
ALT: 17
ALT: 17
AST: 26
AST: 26
Albumin: 4.1
CO2: 27
Calcium: 9.5
Chloride: 97
GFR calc Af Amer: >60
GFR calc Af Amer: >60
GFR calc non Af Amer: >60
Glucose, Bld: 103 — ABNORMAL HIGH
Potassium: 4.7
Sodium: 133 — ABNORMAL LOW
Sodium: 134 — ABNORMAL LOW
Total Bilirubin: 0.8
Total Protein: 6.7

## 2011-02-04 LAB — TYPE AND SCREEN
ABO/RH(D): O POS
Antibody Screen: NEGATIVE

## 2011-02-04 LAB — PREPARE RBC (CROSSMATCH)

## 2012-07-06 ENCOUNTER — Other Ambulatory Visit: Payer: Self-pay | Admitting: *Deleted

## 2012-07-06 ENCOUNTER — Encounter: Payer: Self-pay | Admitting: Cardiovascular Disease

## 2012-07-06 ENCOUNTER — Encounter: Payer: Self-pay | Admitting: *Deleted

## 2012-07-06 ENCOUNTER — Ambulatory Visit (INDEPENDENT_AMBULATORY_CARE_PROVIDER_SITE_OTHER): Payer: Medicare Other | Admitting: Cardiovascular Disease

## 2012-07-06 VITALS — BP 164/77 | HR 58 | Ht 65.5 in | Wt 136.1 lb

## 2012-07-06 DIAGNOSIS — R0600 Dyspnea, unspecified: Secondary | ICD-10-CM

## 2012-07-06 DIAGNOSIS — R0609 Other forms of dyspnea: Secondary | ICD-10-CM

## 2012-07-06 DIAGNOSIS — R0989 Other specified symptoms and signs involving the circulatory and respiratory systems: Secondary | ICD-10-CM

## 2012-07-06 DIAGNOSIS — I10 Essential (primary) hypertension: Secondary | ICD-10-CM

## 2012-07-06 MED ORDER — AMLODIPINE BESYLATE 10 MG PO TABS: 10.0000 mg | ORAL_TABLET | Freq: Every day | ORAL | Status: DC

## 2012-07-06 NOTE — Progress Notes (Signed)
HPI  This is a pleasant 79 year old female who was referred by Dr. Christell Constant for evaluation of dyspnea and hypertension. She has no previous cardiac history. She reports prolonged history of hypertension which has been well-controlled until recently over the last 6-12 months which has been more difficult to control. She is now on 3 different blood pressure medications. She also reports significant decline in functional capacity over the last year. She was seen in the past by Dr.Hochrein in 2010 preoperative cardiovascular evaluation before knee replacement. She was in very good functional capacity at that time with no symptoms. No stress testing was indicated. She did well after the surgery. Over the last year, she has noticed gradual decline in her ability to perform his activities she used to be able to do. She reports exertional fatigue and dyspnea without chest pain. Up until last year, she was able to [facility] her own lawn for about an hour. There is no orthopnea or PND. She does complain of dizziness but there has been no syncope or presyncope.  Allergies  Allergen Reactions  . Ace Inhibitors Cough  . Zocor (Simvastatin) Other (See Comments)    Leg pain     Current Outpatient Prescriptions on File Prior to Visit  Medication Sig Dispense Refill  . albuterol (PROAIR HFA) 108 (90 BASE) MCG/ACT inhaler Inhale 2 puffs into the lungs every 6 (six) hours as needed.        . fluticasone (FLONASE) 50 MCG/ACT nasal spray 2 sprays by Nasal route daily.        . metoprolol (LOPRESSOR) 50 MG tablet Take 50 mg by mouth 2 (two) times daily.        . Mometasone Furo-Formoterol Fum (DULERA) 200-5 MCG/ACT AERO Take 2 puffs first thing in am and then another 2 puffs about 12 hours later.         Marland Kitchen omeprazole (PRILOSEC) 40 MG capsule Take 20 mg by mouth 2 (two) times daily.        No current facility-administered medications on file prior to visit.     Past Medical History  Diagnosis Date  . Atrophic  vaginitis   . Malaise and fatigue   . Thoracic scoliosis   . Insomnia, idiopathic   . Essential hypertension, benign   . Gastritis   . Hiatal hernia   . Colon polyps     Dr Ewing Schlein  . Diverticulosis     Dr Ewing Schlein  . Other and unspecified hyperlipidemia   . Osteoarthrosis and allied disorders   . Abdominal pain      Past Surgical History  Procedure Laterality Date  . Joint replacement    . Rt knee   10 1 08    Dr Thomasena Edis  . Rt rotator  cuff repair  4 1 00     Family History  Problem Relation Age of Onset  . Heart disease Father 33  . Cancer Sister      History   Social History  . Marital Status: Divorced    Spouse Name: N/A    Number of Children: N/A  . Years of Education: N/A   Occupational History  . retired    Social History Main Topics  . Smoking status: Former Games developer  . Smokeless tobacco: Not on file     Comment: pt states only smoke socially in teens  . Alcohol Use: No  . Drug Use: No  . Sexually Active: Not on file   Other Topics Concern  . Not  on file   Social History Narrative  . No narrative on file     ROS Constitutional: Negative for fever, chills, diaphoresis, activity change, appetite change and fatigue.  HENT: Negative for hearing loss, nosebleeds, congestion, sore throat, facial swelling, drooling, trouble swallowing, neck pain, voice change, sinus pressure and tinnitus.  Eyes: Negative for photophobia, pain, discharge and visual disturbance.  Respiratory: Negative for apnea, cough, chest tightness and wheezing.  Cardiovascular: Negative for chest pain, palpitations and leg swelling.  Gastrointestinal: Negative for nausea, vomiting, abdominal pain, diarrhea, constipation, blood in stool and abdominal distention.  Genitourinary: Negative for dysuria, urgency, frequency, hematuria and decreased urine volume.   Skin: Negative for color change, pallor, rash and wound.  Neurological: Negative for dizziness, tremors, seizures, syncope, speech  difficulty, weakness, light-headedness, numbness and headaches.  Psychiatric/Behavioral: Negative for suicidal ideas, hallucinations, behavioral problems and agitation. The patient is not nervous/anxious.     PHYSICAL EXAM   BP 164/77  Pulse 58  Ht 5' 5.5" (1.664 m)  Wt 136 lb 1.9 oz (61.744 kg)  BMI 22.3 kg/m2  SpO2 96% Constitutional: She is oriented to person, place, and time. She appears well-developed and well-nourished. No distress.  HENT: No nasal discharge.  Head: Normocephalic and atraumatic.  Eyes: Pupils are equal and round. Right eye exhibits no discharge. Left eye exhibits no discharge.  Neck: Normal range of motion. Neck supple. No JVD present. No thyromegaly present.  Cardiovascular: Normal rate, regular rhythm, normal heart sounds. Exam reveals no gallop and no friction rub. No murmur heard.  Pulmonary/Chest: Effort normal and breath sounds normal. No stridor. No respiratory distress. She has no wheezes. She has no rales. She exhibits no tenderness.  Abdominal: Soft. Bowel sounds are normal. She exhibits no distension. There is no tenderness. There is no rebound and no guarding.  Musculoskeletal: Normal range of motion. She exhibits no edema and no tenderness.  Neurological: She is alert and oriented to person, place, and time. Coordination normal.  Skin: Skin is warm and dry. No rash noted. She is not diaphoretic. No erythema. No pallor.  Psychiatric: She has a normal mood and affect. Her behavior is normal. Judgment and thought content normal.  Femoral pulses are normal. There is no abdominal bruit.   EKG: Sinus  Bradycardia  -RSR(V1) -nondiagnostic.   PROBABLY NORMAL   ASSESSMENT AND PLAN

## 2012-07-06 NOTE — Patient Instructions (Addendum)
Increase Amlodipine to 10 mg once daily.   Your physician has requested that you have a renal artery duplex. During this test, an ultrasound is used to evaluate blood flow to the kidneys. Allow one hour for this exam. Do not eat after midnight the day before and avoid carbonated beverages. Take your medications as you usually do.  Your physician has requested that you have a lexiscan myoview. For further information please visit [email or web address] Please follow instruction sheet, as given.  Follow up after tests

## 2012-07-06 NOTE — Assessment & Plan Note (Signed)
The patient has progressive exertional dyspnea without chest pain which could be angina equivalent given her age and risk factors. The other possibility is physical deconditioning. Her physical exam is unremarkable and baseline ECG does not show significant ischemic changes. I recommend further evaluation with a pharmacologic nuclear stress test. She is not able to exercise on a treadmill due to arthritis and previous knee replacement.

## 2012-07-06 NOTE — Assessment & Plan Note (Signed)
The patient has uncontrolled hypertension in spite of being on 3 different blood pressure medications. In this age group, I think we have to entertain the possibility of renal artery stenosis. I will obtain renal artery duplex ultrasound. For now, I am increasing amlodipine to 10 mg once daily. We can consider changing metoprolol to carvedilol. However, I am concerned about her dizziness which might worsen with carvedilol. A small dose thiazide diuretic can be considered as well but will have to be cautious given that she is mildly bradycardic on metoprolol.

## 2012-07-14 ENCOUNTER — Telehealth: Payer: Self-pay | Admitting: Cardiovascular Disease

## 2012-07-14 NOTE — Telephone Encounter (Signed)
Lexiscan cardiolite scheduled for 07-20-12 [facility] checking percert

## 2012-07-14 NOTE — Telephone Encounter (Signed)
Auth # [id number] exp 08/28/12

## 2012-07-17 ENCOUNTER — Other Ambulatory Visit: Payer: Self-pay

## 2012-07-17 ENCOUNTER — Ambulatory Visit (INDEPENDENT_AMBULATORY_CARE_PROVIDER_SITE_OTHER): Payer: Medicare Other | Admitting: Family Medicine

## 2012-07-17 DIAGNOSIS — Z516 Encounter for desensitization to allergens: Secondary | ICD-10-CM

## 2012-07-17 MED ORDER — METOPROLOL TARTRATE 50 MG PO TABS: 50.0000 mg | ORAL_TABLET | Freq: Two times a day (BID) | ORAL | Status: DC

## 2012-07-18 ENCOUNTER — Other Ambulatory Visit: Payer: Self-pay | Admitting: *Deleted

## 2012-07-18 MED ORDER — OMEPRAZOLE 20 MG PO CPDR: 20.0000 mg | DELAYED_RELEASE_CAPSULE | Freq: Two times a day (BID) | ORAL | Status: DC

## 2012-07-20 DIAGNOSIS — R0602 Shortness of breath: Secondary | ICD-10-CM

## 2012-07-20 DIAGNOSIS — I10 Essential (primary) hypertension: Secondary | ICD-10-CM

## 2012-07-24 ENCOUNTER — Ambulatory Visit (INDEPENDENT_AMBULATORY_CARE_PROVIDER_SITE_OTHER): Payer: Medicare Other | Admitting: Family Medicine

## 2012-07-24 DIAGNOSIS — Z516 Encounter for desensitization to allergens: Secondary | ICD-10-CM

## 2012-07-26 ENCOUNTER — Telehealth: Payer: Self-pay | Admitting: *Deleted

## 2012-07-26 NOTE — Telephone Encounter (Signed)
Notes Recorded by Lesle Chris, LPN on 05/01/1094 at 10:16 AM Patient notified and verbalized understanding.

## 2012-07-26 NOTE — Telephone Encounter (Signed)
Message copied by Lesle Chris on Wed Jul 26, 2012 10:16 AM ------      Message from: Lorine Bears A      Created: Tue Jul 25, 2012  5:02 PM       Inform patient that  stress test was normal. ------

## 2012-07-31 ENCOUNTER — Ambulatory Visit: Payer: Medicare Other

## 2012-08-01 ENCOUNTER — Ambulatory Visit: Payer: Medicare Other

## 2012-08-01 ENCOUNTER — Ambulatory Visit (INDEPENDENT_AMBULATORY_CARE_PROVIDER_SITE_OTHER): Payer: Medicare Other | Admitting: *Deleted

## 2012-08-01 DIAGNOSIS — Z516 Encounter for desensitization to allergens: Secondary | ICD-10-CM

## 2012-08-02 ENCOUNTER — Encounter (INDEPENDENT_AMBULATORY_CARE_PROVIDER_SITE_OTHER): Payer: Medicare Other

## 2012-08-02 DIAGNOSIS — I10 Essential (primary) hypertension: Secondary | ICD-10-CM

## 2012-08-08 ENCOUNTER — Ambulatory Visit (INDEPENDENT_AMBULATORY_CARE_PROVIDER_SITE_OTHER): Payer: Medicare Other | Admitting: Family Medicine

## 2012-08-08 DIAGNOSIS — Z516 Encounter for desensitization to allergens: Secondary | ICD-10-CM

## 2012-08-09 ENCOUNTER — Encounter: Payer: Self-pay | Admitting: *Deleted

## 2012-08-14 ENCOUNTER — Other Ambulatory Visit: Payer: Self-pay | Admitting: *Deleted

## 2012-08-14 MED ORDER — METOPROLOL TARTRATE 50 MG PO TABS: 50.0000 mg | ORAL_TABLET | Freq: Two times a day (BID) | ORAL | Status: DC

## 2012-08-14 MED ORDER — OMEPRAZOLE 20 MG PO CPDR: 20.0000 mg | DELAYED_RELEASE_CAPSULE | Freq: Two times a day (BID) | ORAL | Status: DC

## 2012-08-15 ENCOUNTER — Ambulatory Visit (INDEPENDENT_AMBULATORY_CARE_PROVIDER_SITE_OTHER): Payer: Medicare Other | Admitting: *Deleted

## 2012-08-15 DIAGNOSIS — Z516 Encounter for desensitization to allergens: Secondary | ICD-10-CM

## 2012-08-15 NOTE — Patient Instructions (Signed)
Allergy Shots Frequently Asked Questions Allergy shots are a treatment used to help lessen allergy symptoms such as:  Sneezing.  Itchy, watery eyes.  Runny, stuffy nose.  Asthma. WHAT MAY BE IN MY ALLERGY SHOT?  Grass, tree, and weed pollens.  Insects.  Animal dander (old skin scales which your animal is always shedding).  Dust mites.  Molds. HOW DO ALLERGY SHOTS HELP MY ALLERGY SYMPTOMS?  Allergy shots "turn down" your body's reaction to the things you are allergic to. HOW OFTEN DO I GET MY ALLERGY SHOT? Every week until you safely "build up" to your "maintenance dose." WHAT IS THE MAINTENANCE DOSE? It is the dose that gives you the most relief from your allergy symptoms. HOW LONG DOES IT TAKE TO GET TO A MAINTENANCE DOSE? If you keep all your appointments and you have no reactions, it may take 5 to 7 months. WHAT IF I MISS AN ALLERGY SHOT? Missing appointments makes it take longer for you to get to your maintenance dose. It is very important to keep all your appointments. If you miss one, make another appointment and tell the nurse at your next appointment. WHAT ARE SOME OF THE COMMON REACTIONS TO ALLERGY SHOTS? Most common reactions include redness and puffiness (swelling) where the shot was given. This reaction is mild and goes away on its own. Less common reactions are:  Itchy eyes, nose, or throat.  Sneezing, runny nose.  Red bumps (hives).  Trouble breathing.  Coughing.  Wheezing.  Scratchy throat.  Tightness in the chest. Caution: If you notice any reaction within 24 hours, report this to the allergy nurse before getting your next shot. WHY MUST I STAY AFTER I GET MY ALLERGY SHOT? For your safety, you must stay in the clinic for up to 30 minutes after getting the allergy shot. Before you leave, the nurse will check for any reaction at the place where the shot was given. WHEN WILL MY ALLERGY SYMPTOMS GET BETTER? Allergy shots begin to work shortly after  you begin treatment, but your allergy symptoms may not get better for 4 to 6 months. WHAT DO I DO IF I FEEL SICK ON THE DAY OF MY ALLERGY SHOT? Tell the nurse before you get your shot. WHEN MIGHT I STOP GETTING MY ALLERGY SHOTS?  Usually after you have been getting allergy shots for about 3 to 5 years.  If the shots do not work for you.  If you start taking a type of medicine called a beta blocker. (Beta blockers are commonly used for lowering blood pressure.)  If you miss many of the appointments for your shots.  If you do not follow the instructions given to you by your allergy clinic staff. Tell your doctor if you start any new medicines while you are getting allergy shots. GET HELP RIGHT AWAY IF:  You have trouble breathing.  You have red bumps on your skin within 24 hours after your shot. Document Released: 01/20/2008 Document Revised: 07/05/2011 Document Reviewed: 01/20/2008 Exit[employer] Patient Information 2013 [employer].  

## 2012-08-22 ENCOUNTER — Ambulatory Visit (INDEPENDENT_AMBULATORY_CARE_PROVIDER_SITE_OTHER): Payer: Medicare Other | Admitting: *Deleted

## 2012-08-22 DIAGNOSIS — Z516 Encounter for desensitization to allergens: Secondary | ICD-10-CM

## 2012-08-29 ENCOUNTER — Ambulatory Visit (INDEPENDENT_AMBULATORY_CARE_PROVIDER_SITE_OTHER): Payer: Medicare Other

## 2012-08-29 DIAGNOSIS — Z516 Encounter for desensitization to allergens: Secondary | ICD-10-CM

## 2012-08-31 ENCOUNTER — Other Ambulatory Visit: Payer: Self-pay

## 2012-08-31 MED ORDER — DONEPEZIL HCL 10 MG PO TABS: 10.0000 mg | ORAL_TABLET | Freq: Every day | ORAL | Status: DC

## 2012-09-05 ENCOUNTER — Ambulatory Visit (INDEPENDENT_AMBULATORY_CARE_PROVIDER_SITE_OTHER): Payer: Medicare Other | Admitting: Family Medicine

## 2012-09-05 DIAGNOSIS — Z516 Encounter for desensitization to allergens: Secondary | ICD-10-CM

## 2012-09-11 ENCOUNTER — Other Ambulatory Visit: Payer: Self-pay

## 2012-09-11 MED ORDER — OMEPRAZOLE 20 MG PO CPDR: 20.0000 mg | DELAYED_RELEASE_CAPSULE | Freq: Two times a day (BID) | ORAL | Status: DC

## 2012-09-11 MED ORDER — METOPROLOL TARTRATE 50 MG PO TABS: 50.0000 mg | ORAL_TABLET | Freq: Two times a day (BID) | ORAL | Status: DC

## 2012-09-12 ENCOUNTER — Ambulatory Visit (INDEPENDENT_AMBULATORY_CARE_PROVIDER_SITE_OTHER): Payer: Medicare Other | Admitting: *Deleted

## 2012-09-12 DIAGNOSIS — Z516 Encounter for desensitization to allergens: Secondary | ICD-10-CM

## 2012-09-12 NOTE — Progress Notes (Signed)
ALLERGY INJECTION GIVEN AND TOLERATED WELL

## 2012-09-19 ENCOUNTER — Encounter: Payer: Self-pay | Admitting: *Deleted

## 2012-09-19 ENCOUNTER — Ambulatory Visit (INDEPENDENT_AMBULATORY_CARE_PROVIDER_SITE_OTHER): Payer: Medicare Other

## 2012-09-19 DIAGNOSIS — Z516 Encounter for desensitization to allergens: Secondary | ICD-10-CM

## 2012-09-26 ENCOUNTER — Ambulatory Visit (INDEPENDENT_AMBULATORY_CARE_PROVIDER_SITE_OTHER): Payer: Medicare Other | Admitting: Family Medicine

## 2012-09-26 DIAGNOSIS — Z516 Encounter for desensitization to allergens: Secondary | ICD-10-CM

## 2012-10-03 ENCOUNTER — Ambulatory Visit (INDEPENDENT_AMBULATORY_CARE_PROVIDER_SITE_OTHER): Payer: Medicare Other | Admitting: *Deleted

## 2012-10-03 DIAGNOSIS — Z516 Encounter for desensitization to allergens: Secondary | ICD-10-CM

## 2012-10-09 ENCOUNTER — Ambulatory Visit: Payer: Self-pay | Admitting: Family Medicine

## 2012-10-10 ENCOUNTER — Ambulatory Visit (INDEPENDENT_AMBULATORY_CARE_PROVIDER_SITE_OTHER): Payer: Medicare Other

## 2012-10-10 DIAGNOSIS — Z516 Encounter for desensitization to allergens: Secondary | ICD-10-CM

## 2012-10-12 ENCOUNTER — Other Ambulatory Visit: Payer: Self-pay

## 2012-10-12 MED ORDER — OMEPRAZOLE 20 MG PO CPDR: 20.0000 mg | DELAYED_RELEASE_CAPSULE | Freq: Two times a day (BID) | ORAL | Status: DC

## 2012-10-12 MED ORDER — METOPROLOL TARTRATE 50 MG PO TABS: 50.0000 mg | ORAL_TABLET | Freq: Two times a day (BID) | ORAL | Status: DC

## 2012-10-16 ENCOUNTER — Ambulatory Visit (INDEPENDENT_AMBULATORY_CARE_PROVIDER_SITE_OTHER): Payer: Medicare Other | Admitting: Family Medicine

## 2012-10-16 VITALS — BP 158/81 | HR 74 | Temp 97.0°F | Ht 62.0 in | Wt 136.8 lb

## 2012-10-16 DIAGNOSIS — R413 Other amnesia: Secondary | ICD-10-CM

## 2012-10-16 DIAGNOSIS — E785 Hyperlipidemia, unspecified: Secondary | ICD-10-CM

## 2012-10-16 DIAGNOSIS — I10 Essential (primary) hypertension: Secondary | ICD-10-CM

## 2012-10-16 DIAGNOSIS — E559 Vitamin D deficiency, unspecified: Secondary | ICD-10-CM

## 2012-10-16 LAB — LIPID PANEL
LDL Cholesterol: 99 mg/dL (ref 0–99)
Triglycerides: 238 mg/dL — ABNORMAL HIGH (ref <150)
VLDL: 48 mg/dL — ABNORMAL HIGH (ref 0–40)

## 2012-10-16 LAB — HEPATIC FUNCTION PANEL
ALT: 16 U/L (ref 0–35)
AST: 25 U/L (ref 0–37)
Bilirubin, Direct: 0.1 mg/dL (ref 0.0–0.3)
Indirect Bilirubin: 0.3 mg/dL (ref 0.0–0.9)
Total Protein: 7.1 g/dL (ref 6.0–8.3)

## 2012-10-16 LAB — POCT CBC
Lymph, poc: 1.9 (ref 0.6–3.4)
MCH, POC: 32.1 pg — AB (ref 27–31.2)
MCHC: 35.2 g/dL (ref 31.8–35.4)
MPV: 6.9 fL (ref 0–99.8)
POC Granulocyte: 3.5 (ref 2–6.9)
POC LYMPH PERCENT: 31.7 %L (ref 10–50)
Platelet Count, POC: 265 10*3/uL (ref 142–424)
RBC: 4.3 M/uL (ref 4.04–5.48)

## 2012-10-16 MED ORDER — MEMANTINE HCL 10 MG PO TABS: 10.0000 mg | ORAL_TABLET | Freq: Two times a day (BID) | ORAL | Status: DC

## 2012-10-16 NOTE — Patient Instructions (Signed)
Drink plenty of fluids. Decrease risk of falling. Take meds as directed

## 2012-10-16 NOTE — Progress Notes (Signed)
  Subjective:    Patient ID: [name], female    DOB: 1925/04/23, 79 y.o.   MRN: [id number]  HPI Patient in today for followup of chronic medical problems. She comes in with her great niece today. Her health maintenance is up to date except for the zostavax. She continues to have problems with her memory. She stays at home by her self fixes her own foods and drives herself to places that she wanted to go to locally.   Review of Systems  Constitutional: Positive for fatigue.  HENT: Negative.   Eyes: Positive for itching (due to allergies).  Respiratory: Negative.   Cardiovascular: Negative.   Gastrointestinal: Positive for diarrhea (intermititent). Negative for abdominal pain. Constipation: intermitent.  Genitourinary: Positive for frequency (some intcontinence). Negative for dysuria.  Musculoskeletal: Negative.   Allergic/Immunologic: Positive for environmental allergies (seasonal).  Psychiatric/Behavioral: Positive for sleep disturbance (due to urinary frequency).       Objective:   Physical Exam BP 158/81  Pulse 74  Temp(Src) 97 F (36.1 C) (Oral)  Ht 5\' 2"  (1.575 m)  Wt 136 lb 12.8 oz (62.052 kg)  BMI 25.01 kg/m2  The patient appeared well nourished and normally developed, alert and oriented to time and place. Speech, behavior and judgement appear normal. Vital signs as documented.  Head exam is unremarkable. No scleral icterus or pallor noted. TMs mouth and throat were normal.  Neck is without jugular venous distension, thyromegally, or carotid bruits. Carotid upstrokes are brisk bilaterally. No cervical adenopathy. Lungs are clear anteriorly and posteriorly to auscultation. Normal respiratory effort. Cardiac exam reveals regular rate and rhythm at 72 per minute.. First and second heart sounds normal.  No murmurs, rubs or gallops.  Abdominal exam reveals normal bowl sounds, no masses, no organomegaly and no aortic enlargement. No inguinal adenopathy. There was slight  right upper quadrant and epigastric tenderness. Extremities are nonedematous and both femoral  pulses are normal. Skin without pallor or jaundice.  Warm and dry, without rash. Neurologic exam reveals normal deep tendon reflexes and normal sensation.          Assessment & Plan:  1. Hyperlipidemia - Hepatic function panel; Standing - Lipid panel; Standing - Hepatic function panel - Lipid panel  2. Vitamin D deficiency - Vitamin D 25 hydroxy; Standing - Vitamin D 25 hydroxy  3. Hypertension - POCT CBC; Standing - BASIC METABOLIC PANEL WITH GFR; Standing - POCT CBC - BASIC METABOLIC PANEL WITH GFR  4. Memory disturbance -Add Namenda 10 mg twice daily to Aricept 10 that she is already taking   Patient Instructions  Drink plenty of fluids. Decrease risk of falling. Take meds as directed

## 2012-10-17 ENCOUNTER — Ambulatory Visit (INDEPENDENT_AMBULATORY_CARE_PROVIDER_SITE_OTHER): Payer: Medicare Other | Admitting: *Deleted

## 2012-10-17 ENCOUNTER — Encounter: Payer: Self-pay | Admitting: Family Medicine

## 2012-10-17 DIAGNOSIS — Z516 Encounter for desensitization to allergens: Secondary | ICD-10-CM

## 2012-10-17 LAB — BASIC METABOLIC PANEL WITH GFR
BUN: 16 mg/dL (ref 6–23)
Calcium: 10.6 mg/dL — ABNORMAL HIGH (ref 8.4–10.5)
Creat: 0.85 mg/dL (ref 0.50–1.10)
GFR, Est African American: 72 mL/min
GFR, Est Non African American: 63 mL/min
Glucose, Bld: 92 mg/dL (ref 70–99)
Potassium: 4.5 mEq/L (ref 3.5–5.3)

## 2012-10-24 ENCOUNTER — Ambulatory Visit (INDEPENDENT_AMBULATORY_CARE_PROVIDER_SITE_OTHER): Payer: Medicare Other | Admitting: *Deleted

## 2012-10-24 DIAGNOSIS — Z298 Encounter for other specified prophylactic measures: Secondary | ICD-10-CM

## 2012-10-24 DIAGNOSIS — Z2989 Encounter for other specified prophylactic measures: Secondary | ICD-10-CM | POA: Insufficient documentation

## 2012-10-24 DIAGNOSIS — Z516 Encounter for desensitization to allergens: Secondary | ICD-10-CM

## 2012-10-31 ENCOUNTER — Ambulatory Visit (INDEPENDENT_AMBULATORY_CARE_PROVIDER_SITE_OTHER): Payer: Medicare Other | Admitting: Family Medicine

## 2012-10-31 DIAGNOSIS — Z516 Encounter for desensitization to allergens: Secondary | ICD-10-CM

## 2012-11-07 ENCOUNTER — Ambulatory Visit (INDEPENDENT_AMBULATORY_CARE_PROVIDER_SITE_OTHER): Payer: Medicare Other | Admitting: *Deleted

## 2012-11-07 DIAGNOSIS — Z516 Encounter for desensitization to allergens: Secondary | ICD-10-CM

## 2012-11-08 ENCOUNTER — Telehealth: Payer: Self-pay | Admitting: *Deleted

## 2012-11-08 NOTE — Telephone Encounter (Signed)
Pt notified of results

## 2012-11-08 NOTE — Telephone Encounter (Signed)
Message copied by Bearl Mulberry on Wed Nov 08, 2012  7:34 PM ------      Message from: Ernestina Penna      Created: Tue Oct 17, 2012  8:06 AM       All liver function tests within normal limit      Electrolytes blood sugar and kidney function tests within normal limits. Calcium was elevated at slightly at 10.6      Vitamin D was good at 58      [facility] traditional lipid panel the triglycerides were very elevated at 238. However she was not fasting and this could be related to a nonfasting state., The total cholesterol was elevated at 209, the LDL cholesterol was good at 99. The HDL was good at 62            Call her knees or her great niece with the results of this was blood work and continue to emphasize aggressive therapeutic lifestyle changes which include diet and exercise ------

## 2012-11-14 ENCOUNTER — Ambulatory Visit (INDEPENDENT_AMBULATORY_CARE_PROVIDER_SITE_OTHER): Payer: Medicare Other | Admitting: *Deleted

## 2012-11-14 DIAGNOSIS — Z298 Encounter for other specified prophylactic measures: Secondary | ICD-10-CM

## 2012-11-14 DIAGNOSIS — Z516 Encounter for desensitization to allergens: Secondary | ICD-10-CM

## 2012-11-21 ENCOUNTER — Ambulatory Visit (INDEPENDENT_AMBULATORY_CARE_PROVIDER_SITE_OTHER): Payer: Medicare Other | Admitting: *Deleted

## 2012-11-21 DIAGNOSIS — Z298 Encounter for other specified prophylactic measures: Secondary | ICD-10-CM

## 2012-11-21 DIAGNOSIS — Z516 Encounter for desensitization to allergens: Secondary | ICD-10-CM

## 2012-11-22 ENCOUNTER — Other Ambulatory Visit: Payer: Self-pay | Admitting: Family Medicine

## 2012-11-28 ENCOUNTER — Ambulatory Visit (INDEPENDENT_AMBULATORY_CARE_PROVIDER_SITE_OTHER): Payer: Medicare Other | Admitting: Family Medicine

## 2012-11-28 DIAGNOSIS — Z516 Encounter for desensitization to allergens: Secondary | ICD-10-CM

## 2012-12-02 ENCOUNTER — Other Ambulatory Visit: Payer: Self-pay | Admitting: Family Medicine

## 2012-12-05 ENCOUNTER — Ambulatory Visit (INDEPENDENT_AMBULATORY_CARE_PROVIDER_SITE_OTHER): Payer: Medicare Other | Admitting: Family Medicine

## 2012-12-05 DIAGNOSIS — Z516 Encounter for desensitization to allergens: Secondary | ICD-10-CM

## 2012-12-11 ENCOUNTER — Other Ambulatory Visit: Payer: Self-pay | Admitting: Family Medicine

## 2012-12-12 ENCOUNTER — Ambulatory Visit (INDEPENDENT_AMBULATORY_CARE_PROVIDER_SITE_OTHER): Payer: Medicare Other | Admitting: *Deleted

## 2012-12-12 DIAGNOSIS — Z298 Encounter for other specified prophylactic measures: Secondary | ICD-10-CM

## 2012-12-12 DIAGNOSIS — Z516 Encounter for desensitization to allergens: Secondary | ICD-10-CM

## 2012-12-19 ENCOUNTER — Ambulatory Visit (INDEPENDENT_AMBULATORY_CARE_PROVIDER_SITE_OTHER): Payer: Medicare Other | Admitting: *Deleted

## 2012-12-19 DIAGNOSIS — Z298 Encounter for other specified prophylactic measures: Secondary | ICD-10-CM

## 2012-12-19 DIAGNOSIS — Z516 Encounter for desensitization to allergens: Secondary | ICD-10-CM

## 2012-12-26 ENCOUNTER — Ambulatory Visit (INDEPENDENT_AMBULATORY_CARE_PROVIDER_SITE_OTHER): Payer: Medicare Other | Admitting: *Deleted

## 2012-12-26 DIAGNOSIS — Z298 Encounter for other specified prophylactic measures: Secondary | ICD-10-CM

## 2012-12-26 DIAGNOSIS — Z516 Encounter for desensitization to allergens: Secondary | ICD-10-CM

## 2012-12-26 NOTE — Patient Instructions (Addendum)
Allergy Shots Frequently Asked Questions Allergy shots are a treatment used to help lessen allergy symptoms such as:  Sneezing.  Itchy, watery eyes.  Runny, stuffy nose.  Asthma. WHAT MAY BE IN MY ALLERGY SHOT?  Grass, tree, and weed pollens.  Insects.  Animal dander (old skin scales which your animal is always shedding).  Dust mites.  Molds. HOW DO ALLERGY SHOTS HELP MY ALLERGY SYMPTOMS?  Allergy shots "turn down" your body's reaction to the things you are allergic to. HOW OFTEN DO I GET MY ALLERGY SHOT? Every week until you safely "build up" to your "maintenance dose." WHAT IS THE MAINTENANCE DOSE? It is the dose that gives you the most relief from your allergy symptoms. HOW LONG DOES IT TAKE TO GET TO A MAINTENANCE DOSE? If you keep all your appointments and you have no reactions, it may take 5 to 7 months. WHAT IF I MISS AN ALLERGY SHOT? Missing appointments makes it take longer for you to get to your maintenance dose. It is very important to keep all your appointments. If you miss one, make another appointment and tell the nurse at your next appointment. WHAT ARE SOME OF THE COMMON REACTIONS TO ALLERGY SHOTS? Most common reactions include redness and puffiness (swelling) where the shot was given. This reaction is mild and goes away on its own. Less common reactions are:  Itchy eyes, nose, or throat.  Sneezing, runny nose.  Red bumps (hives).  Trouble breathing.  Coughing.  Wheezing.  Scratchy throat.  Tightness in the chest. Caution: If you notice any reaction within 24 hours, report this to the allergy nurse before getting your next shot. WHY MUST I STAY AFTER I GET MY ALLERGY SHOT? For your safety, you must stay in the clinic for up to 30 minutes after getting the allergy shot. Before you leave, the nurse will check for any reaction at the place where the shot was given. WHEN WILL MY ALLERGY SYMPTOMS GET BETTER? Allergy shots begin to work shortly after  you begin treatment, but your allergy symptoms may not get better for 4 to 6 months. WHAT DO I DO IF I FEEL SICK ON THE DAY OF MY ALLERGY SHOT? Tell the nurse before you get your shot. WHEN MIGHT I STOP GETTING MY ALLERGY SHOTS?  Usually after you have been getting allergy shots for about 3 to 5 years.  If the shots do not work for you.  If you start taking a type of medicine called a beta blocker. (Beta blockers are commonly used for lowering blood pressure.)  If you miss many of the appointments for your shots.  If you do not follow the instructions given to you by your allergy clinic staff. Tell your doctor if you start any new medicines while you are getting allergy shots. GET HELP RIGHT AWAY IF:  You have trouble breathing.  You have red bumps on your skin within 24 hours after your shot. Document Released: 01/20/2008 Document Revised: 07/05/2011 Document Reviewed: 01/20/2008 [employer] Patient Information 2014 [employer].  

## 2013-01-02 ENCOUNTER — Ambulatory Visit (INDEPENDENT_AMBULATORY_CARE_PROVIDER_SITE_OTHER): Payer: Medicare Other | Admitting: *Deleted

## 2013-01-02 DIAGNOSIS — Z516 Encounter for desensitization to allergens: Secondary | ICD-10-CM

## 2013-01-09 ENCOUNTER — Ambulatory Visit (INDEPENDENT_AMBULATORY_CARE_PROVIDER_SITE_OTHER): Payer: Medicare Other | Admitting: *Deleted

## 2013-01-09 DIAGNOSIS — Z516 Encounter for desensitization to allergens: Secondary | ICD-10-CM

## 2013-01-09 NOTE — Patient Instructions (Signed)
Allergy Shots Frequently Asked Questions Allergy shots are a treatment used to help lessen allergy symptoms such as:  Sneezing.  Itchy, watery eyes.  Runny, stuffy nose.  Asthma. WHAT MAY BE IN MY ALLERGY SHOT?  Grass, tree, and weed pollens.  Insects.  Animal dander (old skin scales which your animal is always shedding).  Dust mites.  Molds. HOW DO ALLERGY SHOTS HELP MY ALLERGY SYMPTOMS?  Allergy shots "turn down" your body's reaction to the things you are allergic to. HOW OFTEN DO I GET MY ALLERGY SHOT? Every week until you safely "build up" to your "maintenance dose." WHAT IS THE MAINTENANCE DOSE? It is the dose that gives you the most relief from your allergy symptoms. HOW LONG DOES IT TAKE TO GET TO A MAINTENANCE DOSE? If you keep all your appointments and you have no reactions, it may take 5 to 7 months. WHAT IF I MISS AN ALLERGY SHOT? Missing appointments makes it take longer for you to get to your maintenance dose. It is very important to keep all your appointments. If you miss one, make another appointment and tell the nurse at your next appointment. WHAT ARE SOME OF THE COMMON REACTIONS TO ALLERGY SHOTS? Most common reactions include redness and puffiness (swelling) where the shot was given. This reaction is mild and goes away on its own. Less common reactions are:  Itchy eyes, nose, or throat.  Sneezing, runny nose.  Red bumps (hives).  Trouble breathing.  Coughing.  Wheezing.  Scratchy throat.  Tightness in the chest. Caution: If you notice any reaction within 24 hours, report this to the allergy nurse before getting your next shot. WHY MUST I STAY AFTER I GET MY ALLERGY SHOT? For your safety, you must stay in the clinic for up to 30 minutes after getting the allergy shot. Before you leave, the nurse will check for any reaction at the place where the shot was given. WHEN WILL MY ALLERGY SYMPTOMS GET BETTER? Allergy shots begin to work shortly after  you begin treatment, but your allergy symptoms may not get better for 4 to 6 months. WHAT DO I DO IF I FEEL SICK ON THE DAY OF MY ALLERGY SHOT? Tell the nurse before you get your shot. WHEN MIGHT I STOP GETTING MY ALLERGY SHOTS?  Usually after you have been getting allergy shots for about 3 to 5 years.  If the shots do not work for you.  If you start taking a type of medicine called a beta blocker. (Beta blockers are commonly used for lowering blood pressure.)  If you miss many of the appointments for your shots.  If you do not follow the instructions given to you by your allergy clinic staff. Tell your doctor if you start any new medicines while you are getting allergy shots. GET HELP RIGHT AWAY IF:  You have trouble breathing.  You have red bumps on your skin within 24 hours after your shot. Document Released: 01/20/2008 Document Revised: 07/05/2011 Document Reviewed: 01/20/2008 [employer] Patient Information 2014 [employer].  

## 2013-01-16 ENCOUNTER — Ambulatory Visit (INDEPENDENT_AMBULATORY_CARE_PROVIDER_SITE_OTHER): Payer: Medicare Other | Admitting: *Deleted

## 2013-01-16 DIAGNOSIS — Z298 Encounter for other specified prophylactic measures: Secondary | ICD-10-CM

## 2013-01-16 DIAGNOSIS — Z516 Encounter for desensitization to allergens: Secondary | ICD-10-CM

## 2013-01-23 ENCOUNTER — Ambulatory Visit: Payer: Medicare Other

## 2013-01-30 ENCOUNTER — Ambulatory Visit (INDEPENDENT_AMBULATORY_CARE_PROVIDER_SITE_OTHER): Payer: Medicare Other

## 2013-01-30 DIAGNOSIS — Z516 Encounter for desensitization to allergens: Secondary | ICD-10-CM

## 2013-02-06 ENCOUNTER — Ambulatory Visit: Payer: Medicare Other

## 2013-02-08 ENCOUNTER — Telehealth: Payer: Self-pay | Admitting: Family Medicine

## 2013-02-08 NOTE — Telephone Encounter (Signed)
Spoke with [name] who states that patient had a fall at the [place] station a few days ago and fell face first on to floor. She has a broken nose and is very bruised up and very sore. Did go to hospital after fall and they did a CT of her head which at that time was negative. Wants appt to be seen for follow up. Appt given

## 2013-02-13 ENCOUNTER — Ambulatory Visit: Payer: Medicare Other

## 2013-02-14 ENCOUNTER — Other Ambulatory Visit: Payer: Self-pay | Admitting: Family Medicine

## 2013-02-15 ENCOUNTER — Ambulatory Visit (INDEPENDENT_AMBULATORY_CARE_PROVIDER_SITE_OTHER): Payer: Medicare Other | Admitting: Family Medicine

## 2013-02-15 ENCOUNTER — Encounter: Payer: Self-pay | Admitting: Family Medicine

## 2013-02-15 VITALS — BP 161/76 | HR 63 | Temp 98.3°F | Ht 62.0 in

## 2013-02-15 DIAGNOSIS — M542 Cervicalgia: Secondary | ICD-10-CM

## 2013-02-15 DIAGNOSIS — R42 Dizziness and giddiness: Secondary | ICD-10-CM

## 2013-02-15 DIAGNOSIS — S0990XD Unspecified injury of head, subsequent encounter: Secondary | ICD-10-CM

## 2013-02-15 DIAGNOSIS — Z516 Encounter for desensitization to allergens: Secondary | ICD-10-CM

## 2013-02-15 DIAGNOSIS — Z5189 Encounter for other specified aftercare: Secondary | ICD-10-CM

## 2013-02-15 DIAGNOSIS — R911 Solitary pulmonary nodule: Secondary | ICD-10-CM

## 2013-02-15 DIAGNOSIS — R51 Headache: Secondary | ICD-10-CM

## 2013-02-15 NOTE — Progress Notes (Signed)
Subjective:    Patient ID: [name], female    DOB: October 13, 1924, 79 y.o.   MRN: [id number]  HPI Patient comes in today with her great niece following a fall at a local business on October 7. She apparently stumbled on an irregular area in the floor and fell forward hitting her head. She developed according to the great niece and the patient, a large hematoma on the forehead above the eye was and a bloody nose.    Review of Systems  Constitutional: Positive for activity change. Negative for fever, chills, appetite change and fatigue.  HENT: Positive for congestion (allergy related) and postnasal drip. Negative for ear discharge, hearing loss and trouble swallowing.   Eyes: Negative for pain, discharge and visual disturbance.  Respiratory: Positive for shortness of breath (some).   Cardiovascular: Negative.   Gastrointestinal: Negative.   Endocrine: Negative.   Genitourinary: Negative.   Musculoskeletal: Positive for neck pain (posterior neck) and neck stiffness.  Skin:       Bruising around her eyes and hematoma resolving  Allergic/Immunologic: Positive for environmental allergies.  Neurological: Positive for dizziness ( early morning with a rising), light-headedness and headaches (occipital in nature). Negative for seizures, speech difficulty and weakness (number than usual).  Hematological: Negative.   Psychiatric/Behavioral: Negative.        Objective:   Physical Exam  Nursing note and vitals reviewed. Constitutional: She is oriented to person, place, and time. She appears well-developed and well-nourished. No distress.  HENT:  Head: Normocephalic.  Right Ear: External ear normal.  Left Ear: External ear normal.  Nose: Nose normal.  Mouth/Throat: Oropharynx is clear and moist.  Resolving hematoma around right orbit Nasal congestion bilaterally with no bleeding  Eyes: Conjunctivae and EOM are normal. Pupils are equal, round, and reactive to light. Right eye exhibits no  discharge. Left eye exhibits no discharge. No scleral icterus.  Neck: Normal range of motion. Neck supple. No JVD present. No thyromegaly present.  Cardiovascular: Normal rate, regular rhythm and normal heart sounds.  Exam reveals no gallop and no friction rub.   No murmur heard. Pulmonary/Chest: Effort normal and breath sounds normal. No respiratory distress. She has no wheezes. She has no rales. She exhibits no tenderness.  Abdominal: Soft. Bowel sounds are normal. She exhibits no mass. There is no tenderness. There is no rebound and no guarding.  Musculoskeletal: Normal range of motion. She exhibits no edema and no tenderness.  Good strength both upper and lower extremities There was posterior cervical pain with movement of head to the left right forward or backward. There was slight tenderness in the occiput region of the neck. There were no masses felt in the neck   Lymphadenopathy:    She has no cervical adenopathy.  Neurological: She is alert and oriented to person, place, and time. She has normal reflexes. She displays normal reflexes. No cranial nerve deficit. She exhibits normal muscle tone. Coordination normal.  Skin: Skin is warm and dry. No rash noted.  Psychiatric: She has a normal mood and affect. Her behavior is normal. Judgment and thought content normal.  The patient was pleasant and had a positive attitude          Assessment & Plan:   1. Head injury, subsequent encounter   2. Need for desensitization to allergens   3. Solitary pulmonary nodule   4. Occipital headache   5. Neck pain, bilateral   6. Dizziness    Orders Placed This Encounter  Procedures  .  CT Chest Wo Contrast    [facility].    Standing Status: Future     Number of Occurrences:      Standing Expiration Date: 04/17/2014    Order Specific Question:  Reason for Exam (SYMPTOM  OR DIAGNOSIS REQUIRED)    Answer:  pulmonary nodule    Order Specific Question:  Preferred imaging location?     Answer:  External   No orders of the defined types were placed in this encounter.   Patient Instructions  Continue current medications. Continue good therapeutic lifestyle changes.  Fall precautions discussed with patient. Follow up as planned and earlier as needed.  We will arrange followup chest CT at [facility] because of the question pulmonary nodule Use warm wet compresses to the neck 20 minutes 3 or 4 times a day Limit driving until the next visit, only short distances and only during the day during non-busy hours   Nyra Capes MD

## 2013-02-15 NOTE — Patient Instructions (Addendum)
Continue current medications. Continue good therapeutic lifestyle changes.  Fall precautions discussed with patient. Follow up as planned and earlier as needed.  We will arrange followup chest CT at [facility] because of the question pulmonary nodule Use warm wet compresses to the neck 20 minutes 3 or 4 times a day Limit driving until the next visit, only short distances and only during the day during non-busy hours

## 2013-02-19 ENCOUNTER — Ambulatory Visit: Payer: Medicare Other

## 2013-02-19 ENCOUNTER — Other Ambulatory Visit (INDEPENDENT_AMBULATORY_CARE_PROVIDER_SITE_OTHER): Payer: Medicare Other | Admitting: *Deleted

## 2013-02-19 DIAGNOSIS — Z23 Encounter for immunization: Secondary | ICD-10-CM

## 2013-02-20 ENCOUNTER — Ambulatory Visit: Payer: Medicare Other

## 2013-02-22 ENCOUNTER — Ambulatory Visit (INDEPENDENT_AMBULATORY_CARE_PROVIDER_SITE_OTHER): Payer: Medicare Other

## 2013-02-22 ENCOUNTER — Other Ambulatory Visit: Payer: Self-pay | Admitting: Cardiovascular Disease

## 2013-02-22 ENCOUNTER — Other Ambulatory Visit: Payer: Self-pay | Admitting: *Deleted

## 2013-02-22 DIAGNOSIS — Z298 Encounter for other specified prophylactic measures: Secondary | ICD-10-CM

## 2013-02-22 DIAGNOSIS — I10 Essential (primary) hypertension: Secondary | ICD-10-CM

## 2013-02-22 DIAGNOSIS — Z516 Encounter for desensitization to allergens: Secondary | ICD-10-CM

## 2013-02-22 MED ORDER — AMLODIPINE BESYLATE 10 MG PO TABS: 10.0000 mg | ORAL_TABLET | Freq: Every day | ORAL | Status: DC

## 2013-02-22 NOTE — Telephone Encounter (Signed)
Requested Prescriptions   Signed Prescriptions Disp Refills  . amLODipine (NORVASC) 10 MG tablet 30 tablet 3    Sig: Take 1 tablet (10 mg total) by mouth daily.    Authorizing Provider: ARIDA, MUHAMMAD A    Ordering User: Tryce Surratt C    

## 2013-02-27 ENCOUNTER — Ambulatory Visit (INDEPENDENT_AMBULATORY_CARE_PROVIDER_SITE_OTHER): Payer: Medicare Other

## 2013-02-27 DIAGNOSIS — Z516 Encounter for desensitization to allergens: Secondary | ICD-10-CM

## 2013-03-06 ENCOUNTER — Ambulatory Visit (INDEPENDENT_AMBULATORY_CARE_PROVIDER_SITE_OTHER): Payer: Medicare Other | Admitting: *Deleted

## 2013-03-06 DIAGNOSIS — Z516 Encounter for desensitization to allergens: Secondary | ICD-10-CM

## 2013-03-06 NOTE — Progress Notes (Signed)
Patient ID: [name], female   DOB: 1924-10-30, 80 y.o.   MRN: [id number] Pt tolerated inj well

## 2013-03-12 ENCOUNTER — Ambulatory Visit (INDEPENDENT_AMBULATORY_CARE_PROVIDER_SITE_OTHER): Payer: Medicare Other | Admitting: Family Medicine

## 2013-03-12 ENCOUNTER — Encounter: Payer: Self-pay | Admitting: Family Medicine

## 2013-03-12 VITALS — BP 142/64 | HR 61 | Temp 97.5°F | Ht 62.0 in | Wt 144.4 lb

## 2013-03-12 DIAGNOSIS — E559 Vitamin D deficiency, unspecified: Secondary | ICD-10-CM

## 2013-03-12 DIAGNOSIS — E785 Hyperlipidemia, unspecified: Secondary | ICD-10-CM

## 2013-03-12 DIAGNOSIS — I10 Essential (primary) hypertension: Secondary | ICD-10-CM

## 2013-03-12 DIAGNOSIS — K219 Gastro-esophageal reflux disease without esophagitis: Secondary | ICD-10-CM

## 2013-03-12 DIAGNOSIS — Z516 Encounter for desensitization to allergens: Secondary | ICD-10-CM

## 2013-03-12 DIAGNOSIS — R9389 Abnormal findings on diagnostic imaging of other specified body structures: Secondary | ICD-10-CM

## 2013-03-12 DIAGNOSIS — R413 Other amnesia: Secondary | ICD-10-CM

## 2013-03-12 LAB — POCT CBC
Hemoglobin: 12.2 g/dL (ref 12.2–16.2)
Lymph, poc: 2.2 (ref 0.6–3.4)
MCH, POC: 30.9 pg (ref 27–31.2)
MCHC: 34.7 g/dL (ref 31.8–35.4)
MCV: 88.8 fL (ref 80–97)
MPV: 7 fL (ref 0–99.8)
POC LYMPH PERCENT: 30.3 %L (ref 10–50)

## 2013-03-12 NOTE — Patient Instructions (Addendum)
Continue current medications. Continue good therapeutic lifestyle changes which include good diet and exercise. Fall precautions discussed with patient. Schedule your flu vaccine if you haven't had it yet If you are over 77 years old - you may need Prevnar 13 or the adult Pneumonia vaccine. Your CT scan will need to be repeated in February of 2015 Call back with the names of the prescription nose sprays that need to be refilled, use these regularly Try to take less Advil and take Tylenol instead as this will be safer for your body Use a cool mist humidifier in her bedroom at night The next pelvic exam will not need to be done until June of 15 It is important for you to get a life line----please get your family to call and order this Allergy Shots Frequently Asked Questions Allergy shots are a treatment used to help lessen allergy symptoms such as:  Sneezing.  Itchy, watery eyes.  Runny, stuffy nose.  Asthma. WHAT MAY BE IN MY ALLERGY SHOT?  Grass, tree, and weed pollens.  Insects.  Animal dander (old skin scales which your animal is always shedding).  Dust mites.  Molds. HOW DO ALLERGY SHOTS HELP MY ALLERGY SYMPTOMS?  Allergy shots "turn down" your body's reaction to the things you are allergic to. HOW OFTEN DO I GET MY ALLERGY SHOT? Every week until you safely "build up" to your "maintenance dose." WHAT IS THE MAINTENANCE DOSE? It is the dose that gives you the most relief from your allergy symptoms. HOW LONG DOES IT TAKE TO GET TO A MAINTENANCE DOSE? If you keep all your appointments and you have no reactions, it may take 5 to 7 months. WHAT IF I MISS AN ALLERGY SHOT? Missing appointments makes it take longer for you to get to your maintenance dose. It is very important to keep all your appointments. If you miss one, make another appointment and tell the nurse at your next appointment. WHAT ARE SOME OF THE COMMON REACTIONS TO ALLERGY SHOTS? Most common reactions include  redness and puffiness (swelling) where the shot was given. This reaction is mild and goes away on its own. Less common reactions are:  Itchy eyes, nose, or throat.  Sneezing, runny nose.  Red bumps (hives).  Trouble breathing.  Coughing.  Wheezing.  Scratchy throat.  Tightness in the chest. Caution: If you notice any reaction within 24 hours, report this to the allergy nurse before getting your next shot. WHY MUST I STAY AFTER I GET MY ALLERGY SHOT? For your safety, you must stay in the clinic for up to 30 minutes after getting the allergy shot. Before you leave, the nurse will check for any reaction at the place where the shot was given. WHEN WILL MY ALLERGY SYMPTOMS GET BETTER? Allergy shots begin to work shortly after you begin treatment, but your allergy symptoms may not get better for 4 to 6 months. WHAT DO I DO IF I FEEL SICK ON THE DAY OF MY ALLERGY SHOT? Tell the nurse before you get your shot. WHEN MIGHT I STOP GETTING MY ALLERGY SHOTS?  Usually after you have been getting allergy shots for about 3 to 5 years.  If the shots do not work for you.  If you start taking a type of medicine called a beta blocker. (Beta blockers are commonly used for lowering blood pressure.)  If you miss many of the appointments for your shots.  If you do not follow the instructions given to you by your allergy clinic  staff. Tell your doctor if you start any new medicines while you are getting allergy shots. GET HELP RIGHT AWAY IF:  You have trouble breathing.  You have red bumps on your skin within 24 hours after your shot. Document Released: 01/20/2008 Document Revised: 07/05/2011 Document Reviewed: 01/20/2008 [facility] Patient Information 2014 [place], [place].

## 2013-03-12 NOTE — Progress Notes (Signed)
Subjective:    Patient ID: [name], female    DOB: January 10, 1925, 79 y.o.   MRN: [id number]  HPI Pt here for follow up and management of chronic medical problems. The patient comes in today with her niece. She has a problem with head congestion and allergies and has to go to bathroom frequently at night time. She also has occasional diarrhea. Patient had a recent chest CT and this will need to be re\re he did in February, a copy of the report was given to her niece.    Review of Systems  Constitutional: Positive for fatigue. Negative for activity change and appetite change.  HENT: Positive for postnasal drip, rhinorrhea and sneezing.   Eyes: Negative for pain, redness and itching.  Respiratory: Positive for shortness of breath (with exertion) and wheezing (intermitent). Negative for cough.   Cardiovascular: Negative for chest pain, palpitations and leg swelling.  Gastrointestinal: Positive for diarrhea (occasional). Negative for abdominal pain and constipation.  Endocrine: Negative for cold intolerance and heat intolerance.  Genitourinary: Positive for frequency (worse at night). Negative for dysuria, vaginal bleeding and vaginal discharge.  Musculoskeletal: Positive for arthralgias (R shoulder radiating down arm) and neck pain.  Skin: Negative for color change, rash and wound.  Allergic/Immunologic: Positive for environmental allergies.  Neurological: Negative for dizziness, tremors, weakness, light-headedness and headaches.  Psychiatric/Behavioral: Positive for sleep disturbance (wakes frequently to urinate) and decreased concentration (slight memory deficit). Negative for confusion. The patient is not nervous/anxious.        Objective:   Physical Exam  Nursing note and vitals reviewed. Constitutional: She is oriented to person, place, and time. She appears well-developed and well-nourished. No distress.  For her age  HENT:  Head: Normocephalic and atraumatic.  Right Ear:  External ear normal.  Left Ear: External ear normal.  Mouth/Throat: Oropharynx is clear and moist.  Nasal congestion bilaterally  Eyes: Conjunctivae and EOM are normal. Pupils are equal, round, and reactive to light. Right eye exhibits no discharge. Left eye exhibits no discharge. No scleral icterus.  Neck: Normal range of motion. Neck supple. No thyromegaly present.  Cardiovascular: Normal rate, regular rhythm, normal heart sounds and intact distal pulses.  Exam reveals no gallop and no friction rub.   No murmur heard.  At 72 per minute  Pulmonary/Chest: Effort normal and breath sounds normal. No respiratory distress. She has no wheezes. She has no rales. She exhibits no tenderness.  Abdominal: Soft. Bowel sounds are normal. She exhibits no mass. There is no tenderness. There is no rebound and no guarding.  Musculoskeletal: Normal range of motion. She exhibits no edema and no tenderness.  Lymphadenopathy:    She has no cervical adenopathy.  Neurological: She is alert and oriented to person, place, and time. She has normal reflexes. No cranial nerve deficit.  Skin: Skin is warm and dry.  Psychiatric: She has a normal mood and affect. Her behavior is normal. Judgment and thought content normal.    BP 142/64  Pulse 61  Temp(Src) 97.5 F (36.4 C) (Oral)  Ht 5\' 2"  (1.575 m)  Wt 144 lb 6.4 oz (65.499 kg)  BMI 26.40 kg/m2       Assessment & Plan:   1. HYPERTENSION   2. GERD   3. DYSLIPIDEMIA   4. Memory impairment   5. Abnormal chest CT    No orders of the defined types were placed in this encounter.   No orders of the defined types were placed in this encounter.  Patient Instructions  Continue current medications. Continue good therapeutic lifestyle changes which include good diet and exercise. Fall precautions discussed with patient. Schedule your flu vaccine if you haven't had it yet If you are over 69 years old - you may need Prevnar 13 or the adult Pneumonia  vaccine. Your CT scan will need to be repeated in February of 2015 Call back with the names of the prescription nose sprays that need to be refilled, use these regularly Try to take less Advil and take Tylenol instead as this will be safer for your body Use a cool mist humidifier in her bedroom at night The next pelvic exam will not need to be done until June of 15 It is important for you to get a life line----please get your family to call and order this    labs will be done today while the patient is here  Nyra Capes MD

## 2013-03-13 ENCOUNTER — Ambulatory Visit: Payer: Medicare Other

## 2013-03-13 LAB — LIPID PANEL W/O CHOL/HDL RATIO
Cholesterol, Total: 230 mg/dL — ABNORMAL HIGH (ref 100–199)
LDL Calculated: 115 mg/dL — ABNORMAL HIGH (ref 0–99)
VLDL Cholesterol Cal: 57 mg/dL — ABNORMAL HIGH (ref 5–40)

## 2013-03-13 LAB — HEPATIC FUNCTION PANEL
ALT: 10 IU/L (ref 0–32)
AST: 20 IU/L (ref 0–40)
Bilirubin, Direct: 0.07 mg/dL (ref 0.00–0.40)

## 2013-03-13 LAB — BASIC METABOLIC PANEL
BUN: 18 mg/dL (ref 8–27)
CO2: 25 mmol/L (ref 18–29)
Calcium: 10.5 mg/dL — ABNORMAL HIGH (ref 8.6–10.2)
Chloride: 96 mmol/L — ABNORMAL LOW (ref 97–108)
GFR calc Af Amer: 84 mL/min/{1.73_m2} (ref >59)
Glucose: 100 mg/dL — ABNORMAL HIGH (ref 65–99)
Potassium: 5.3 mmol/L — ABNORMAL HIGH (ref 3.5–5.2)
Sodium: 138 mmol/L (ref 134–144)

## 2013-03-13 LAB — AMBIG ABBREV BMP8 DEFAULT

## 2013-03-13 LAB — AMBIG ABBREV LP DEFAULT

## 2013-03-14 ENCOUNTER — Other Ambulatory Visit: Payer: Self-pay | Admitting: Family Medicine

## 2013-03-14 LAB — SPECIMEN STATUS REPORT

## 2013-03-14 LAB — VITAMIN D 25 HYDROXY (VIT D DEFICIENCY, FRACTURES): Vit D, 25-Hydroxy: 41.2 ng/mL (ref 30.0–100.0)

## 2013-03-20 ENCOUNTER — Ambulatory Visit (INDEPENDENT_AMBULATORY_CARE_PROVIDER_SITE_OTHER): Payer: Medicare Other | Admitting: *Deleted

## 2013-03-20 DIAGNOSIS — Z516 Encounter for desensitization to allergens: Secondary | ICD-10-CM

## 2013-03-21 ENCOUNTER — Ambulatory Visit: Payer: Medicare Other | Admitting: Family Medicine

## 2013-03-27 ENCOUNTER — Ambulatory Visit: Payer: Medicare Other

## 2013-04-02 ENCOUNTER — Ambulatory Visit (INDEPENDENT_AMBULATORY_CARE_PROVIDER_SITE_OTHER): Payer: Medicare Other | Admitting: *Deleted

## 2013-04-02 DIAGNOSIS — Z516 Encounter for desensitization to allergens: Secondary | ICD-10-CM

## 2013-04-02 NOTE — Patient Instructions (Signed)
Allergy Shots Frequently Asked Questions Allergy shots are a treatment used to help lessen allergy symptoms such as:  Sneezing.  Itchy, watery eyes.  Runny, stuffy nose.  Asthma. WHAT MAY BE IN MY ALLERGY SHOT?  Grass, tree, and weed pollens.  Insects.  Animal dander (old skin scales which your animal is always shedding).  Dust mites.  Molds. HOW DO ALLERGY SHOTS HELP MY ALLERGY SYMPTOMS?  Allergy shots "turn down" your body's reaction to the things you are allergic to. HOW OFTEN DO I GET MY ALLERGY SHOT? Every week until you safely "build up" to your "maintenance dose." WHAT IS THE MAINTENANCE DOSE? It is the dose that gives you the most relief from your allergy symptoms. HOW LONG DOES IT TAKE TO GET TO A MAINTENANCE DOSE? If you keep all your appointments and you have no reactions, it may take 5 to 7 months. WHAT IF I MISS AN ALLERGY SHOT? Missing appointments makes it take longer for you to get to your maintenance dose. It is very important to keep all your appointments. If you miss one, make another appointment and tell the nurse at your next appointment. WHAT ARE SOME OF THE COMMON REACTIONS TO ALLERGY SHOTS? Most common reactions include redness and puffiness (swelling) where the shot was given. This reaction is mild and goes away on its own. Less common reactions are:  Itchy eyes, nose, or throat.  Sneezing, runny nose.  Red bumps (hives).  Trouble breathing.  Coughing.  Wheezing.  Scratchy throat.  Tightness in the chest. Caution: If you notice any reaction within 24 hours, report this to the allergy nurse before getting your next shot. WHY MUST I STAY AFTER I GET MY ALLERGY SHOT? For your safety, you must stay in the clinic for up to 30 minutes after getting the allergy shot. Before you leave, the nurse will check for any reaction at the place where the shot was given. WHEN WILL MY ALLERGY SYMPTOMS GET BETTER? Allergy shots begin to work shortly after  you begin treatment, but your allergy symptoms may not get better for 4 to 6 months. WHAT DO I DO IF I FEEL SICK ON THE DAY OF MY ALLERGY SHOT? Tell the nurse before you get your shot. WHEN MIGHT I STOP GETTING MY ALLERGY SHOTS?  Usually after you have been getting allergy shots for about 3 to 5 years.  If the shots do not work for you.  If you start taking a type of medicine called a beta blocker. (Beta blockers are commonly used for lowering blood pressure.)  If you miss many of the appointments for your shots.  If you do not follow the instructions given to you by your allergy clinic staff. Tell your doctor if you start any new medicines while you are getting allergy shots. GET HELP RIGHT AWAY IF:  You have trouble breathing.  You have red bumps on your skin within 24 hours after your shot. Document Released: 01/20/2008 Document Revised: 07/05/2011 Document Reviewed: 01/20/2008 [employer] Patient Information 2014 [employer].  

## 2013-04-02 NOTE — Progress Notes (Signed)
Allergy shot given and tolerated well.  

## 2013-04-03 ENCOUNTER — Ambulatory Visit: Payer: Medicare Other

## 2013-04-05 ENCOUNTER — Ambulatory Visit: Payer: Medicare Other

## 2013-04-10 ENCOUNTER — Ambulatory Visit (INDEPENDENT_AMBULATORY_CARE_PROVIDER_SITE_OTHER): Payer: Medicare Other | Admitting: *Deleted

## 2013-04-10 DIAGNOSIS — Z516 Encounter for desensitization to allergens: Secondary | ICD-10-CM

## 2013-04-12 ENCOUNTER — Ambulatory Visit (INDEPENDENT_AMBULATORY_CARE_PROVIDER_SITE_OTHER): Payer: Medicare Other | Admitting: *Deleted

## 2013-04-12 DIAGNOSIS — Z516 Encounter for desensitization to allergens: Secondary | ICD-10-CM

## 2013-04-12 NOTE — Progress Notes (Signed)
Patient tolerated well.

## 2013-04-17 ENCOUNTER — Ambulatory Visit: Payer: Medicare Other

## 2013-04-20 ENCOUNTER — Other Ambulatory Visit: Payer: Self-pay | Admitting: Family Medicine

## 2013-04-24 ENCOUNTER — Ambulatory Visit (INDEPENDENT_AMBULATORY_CARE_PROVIDER_SITE_OTHER): Payer: Medicare Other | Admitting: *Deleted

## 2013-04-24 DIAGNOSIS — Z516 Encounter for desensitization to allergens: Secondary | ICD-10-CM

## 2013-04-24 NOTE — Progress Notes (Signed)
Allergy injection given and tolerated well 

## 2013-04-24 NOTE — Patient Instructions (Signed)
Allergy Shots Frequently Asked Questions Allergy shots are a treatment used to help lessen allergy symptoms such as:  Sneezing.  Itchy, watery eyes.  Runny, stuffy nose.  Asthma. WHAT MAY BE IN MY ALLERGY SHOT?  Grass, tree, and weed pollens.  Insects.  Animal dander (old skin scales which your animal is always shedding).  Dust mites.  Molds. HOW DO ALLERGY SHOTS HELP MY ALLERGY SYMPTOMS?  Allergy shots "turn down" your body's reaction to the things you are allergic to. HOW OFTEN DO I GET MY ALLERGY SHOT? Every week until you safely "build up" to your "maintenance dose." WHAT IS THE MAINTENANCE DOSE? It is the dose that gives you the most relief from your allergy symptoms. HOW LONG DOES IT TAKE TO GET TO A MAINTENANCE DOSE? If you keep all your appointments and you have no reactions, it may take 5 to 7 months. WHAT IF I MISS AN ALLERGY SHOT? Missing appointments makes it take longer for you to get to your maintenance dose. It is very important to keep all your appointments. If you miss one, make another appointment and tell the nurse at your next appointment. WHAT ARE SOME OF THE COMMON REACTIONS TO ALLERGY SHOTS? Most common reactions include redness and puffiness (swelling) where the shot was given. This reaction is mild and goes away on its own. Less common reactions are:  Itchy eyes, nose, or throat.  Sneezing, runny nose.  Red bumps (hives).  Trouble breathing.  Coughing.  Wheezing.  Scratchy throat.  Tightness in the chest. Caution: If you notice any reaction within 24 hours, report this to the allergy nurse before getting your next shot. WHY MUST I STAY AFTER I GET MY ALLERGY SHOT? For your safety, you must stay in the clinic for up to 30 minutes after getting the allergy shot. Before you leave, the nurse will check for any reaction at the place where the shot was given. WHEN WILL MY ALLERGY SYMPTOMS GET BETTER? Allergy shots begin to work shortly after  you begin treatment, but your allergy symptoms may not get better for 4 to 6 months. WHAT DO I DO IF I FEEL SICK ON THE DAY OF MY ALLERGY SHOT? Tell the nurse before you get your shot. WHEN MIGHT I STOP GETTING MY ALLERGY SHOTS?  Usually after you have been getting allergy shots for about 3 to 5 years.  If the shots do not work for you.  If you start taking a type of medicine called a beta blocker. (Beta blockers are commonly used for lowering blood pressure.)  If you miss many of the appointments for your shots.  If you do not follow the instructions given to you by your allergy clinic staff. Tell your doctor if you start any new medicines while you are getting allergy shots. GET HELP RIGHT AWAY IF:  You have trouble breathing.  You have red bumps on your skin within 24 hours after your shot. Document Released: 01/20/2008 Document Revised: 07/05/2011 Document Reviewed: 01/20/2008 [employer] Patient Information 2014 [employer].  

## 2013-04-27 ENCOUNTER — Ambulatory Visit (INDEPENDENT_AMBULATORY_CARE_PROVIDER_SITE_OTHER): Payer: Medicare Other | Admitting: General Practice

## 2013-04-27 ENCOUNTER — Ambulatory Visit (INDEPENDENT_AMBULATORY_CARE_PROVIDER_SITE_OTHER): Payer: Medicare Other | Admitting: *Deleted

## 2013-04-27 ENCOUNTER — Encounter: Payer: Self-pay | Admitting: General Practice

## 2013-04-27 VITALS — BP 127/67 | HR 65 | Temp 98.4°F | Ht 62.0 in | Wt 142.0 lb

## 2013-04-27 DIAGNOSIS — R062 Wheezing: Secondary | ICD-10-CM

## 2013-04-27 DIAGNOSIS — J069 Acute upper respiratory infection, unspecified: Secondary | ICD-10-CM

## 2013-04-27 DIAGNOSIS — J309 Allergic rhinitis, unspecified: Secondary | ICD-10-CM

## 2013-04-27 DIAGNOSIS — Z516 Encounter for desensitization to allergens: Secondary | ICD-10-CM

## 2013-04-27 MED ORDER — METHYLPREDNISOLONE ACETATE 40 MG/ML IJ SUSP: 40.0000 mg | Freq: Once | INTRAMUSCULAR | Status: DC

## 2013-04-27 MED ORDER — ALBUTEROL SULFATE HFA 108 (90 BASE) MCG/ACT IN AERS: 2.0000 | INHALATION_SPRAY | Freq: Four times a day (QID) | RESPIRATORY_TRACT | Status: DC | PRN

## 2013-04-27 MED ORDER — AZITHROMYCIN 250 MG PO TABS: ORAL_TABLET | ORAL | Status: DC

## 2013-04-27 NOTE — Patient Instructions (Signed)
Allergy Shots Frequently Asked Questions Allergy shots are a treatment used to help lessen allergy symptoms such as:  Sneezing.  Itchy, watery eyes.  Runny, stuffy nose.  Asthma. WHAT MAY BE IN MY ALLERGY SHOT?  Grass, tree, and weed pollens.  Insects.  Animal dander (old skin scales which your animal is always shedding).  Dust mites.  Molds. HOW DO ALLERGY SHOTS HELP MY ALLERGY SYMPTOMS?  Allergy shots "turn down" your body's reaction to the things you are allergic to. HOW OFTEN DO I GET MY ALLERGY SHOT? Every week until you safely "build up" to your "maintenance dose." WHAT IS THE MAINTENANCE DOSE? It is the dose that gives you the most relief from your allergy symptoms. HOW LONG DOES IT TAKE TO GET TO A MAINTENANCE DOSE? If you keep all your appointments and you have no reactions, it may take 5 to 7 months. WHAT IF I MISS AN ALLERGY SHOT? Missing appointments makes it take longer for you to get to your maintenance dose. It is very important to keep all your appointments. If you miss one, make another appointment and tell the nurse at your next appointment. WHAT ARE SOME OF THE COMMON REACTIONS TO ALLERGY SHOTS? Most common reactions include redness and puffiness (swelling) where the shot was given. This reaction is mild and goes away on its own. Less common reactions are:  Itchy eyes, nose, or throat.  Sneezing, runny nose.  Red bumps (hives).  Trouble breathing.  Coughing.  Wheezing.  Scratchy throat.  Tightness in the chest. Caution: If you notice any reaction within 24 hours, report this to the allergy nurse before getting your next shot. WHY MUST I STAY AFTER I GET MY ALLERGY SHOT? For your safety, you must stay in the clinic for up to 30 minutes after getting the allergy shot. Before you leave, the nurse will check for any reaction at the place where the shot was given. WHEN WILL MY ALLERGY SYMPTOMS GET BETTER? Allergy shots begin to work shortly after  you begin treatment, but your allergy symptoms may not get better for 4 to 6 months. WHAT DO I DO IF I FEEL SICK ON THE DAY OF MY ALLERGY SHOT? Tell the nurse before you get your shot. WHEN MIGHT I STOP GETTING MY ALLERGY SHOTS?  Usually after you have been getting allergy shots for about 3 to 5 years.  If the shots do not work for you.  If you start taking a type of medicine called a beta blocker. (Beta blockers are commonly used for lowering blood pressure.)  If you miss many of the appointments for your shots.  If you do not follow the instructions given to you by your allergy clinic staff. Tell your doctor if you start any new medicines while you are getting allergy shots. GET HELP RIGHT AWAY IF:  You have trouble breathing.  You have red bumps on your skin within 24 hours after your shot. Document Released: 01/20/2008 Document Revised: 07/05/2011 Document Reviewed: 01/20/2008 [employer] Patient Information 2014 [employer].  

## 2013-04-27 NOTE — Progress Notes (Signed)
   Subjective:    Patient ID: [name], female    DOB: 1924/09/15, 80 y.o.   MRN: [id number]  Cough This is a new problem. The current episode started more than 1 month ago. The cough is non-productive. Associated symptoms include postnasal drip, rhinorrhea and wheezing. Pertinent negatives include no chills, fever, nasal congestion or sore throat. The symptoms are aggravated by lying down. She has tried nothing (reports having inhaler, hasn't used  directed ) for the symptoms. Her past medical history is significant for asthma. There is no history of bronchitis or pneumonia.      Review of Systems  Constitutional: Negative for fever and chills.  HENT: Positive for postnasal drip, rhinorrhea and sneezing. Negative for congestion and sore throat.   Eyes: Positive for itching.  Respiratory: Positive for cough and wheezing. Negative for chest tightness.   All other systems reviewed and are negative.       Objective:   Physical Exam  Constitutional: She is oriented to person, place, and time. She appears well-developed and well-nourished.  HENT:  Head: Normocephalic and atraumatic.  Right Ear: External ear normal.  Left Ear: External ear normal.  Mouth/Throat: Posterior oropharyngeal erythema present.  Cardiovascular: Normal rate, regular rhythm and normal heart sounds.   Pulmonary/Chest: Effort normal and breath sounds normal. No respiratory distress. She exhibits no tenderness.  Neurological: She is alert and oriented to person, place, and time.  Skin: Skin is warm and dry.  Psychiatric: She has a normal mood and affect.           Assessment & Plan:  1. Wheezing - albuterol (PROAIR HFA) 108 (90 BASE) MCG/ACT inhaler; Inhale 2 puffs into the lungs every 6 (six) hours as needed.  Dispense: 1 Inhaler; Refill: 3  2. Allergic rhinitis -avoid irritants -maintain appointments with allergist -take medications as prescribed -RTO if symptoms worsen  -Patient verbalized  understanding Coralie KeensMae E. Icela Glymph, FNP-C

## 2013-04-27 NOTE — Patient Instructions (Addendum)
Allergic Rhinitis Allergic rhinitis is when the mucous membranes in the nose respond to allergens. Allergens are particles in the air that cause your body to have an allergic reaction. This causes you to release allergic antibodies. Through a chain of events, these eventually cause you to release histamine into the blood stream (hence the use of antihistamines). Although meant to be protective to the body, it is this release that causes your discomfort, such as frequent sneezing, congestion and an itchy runny nose.  CAUSES  The pollen allergens may come from grasses, trees, and weeds. This is seasonal allergic rhinitis, or "hay fever." Other allergens cause year-round allergic rhinitis (perennial allergic rhinitis) such as house dust mite allergen, pet dander and mold spores.  SYMPTOMS   Nasal stuffiness (congestion).  Runny, itchy nose with sneezing and tearing of the eyes.  There is often an itching of the mouth, eyes and ears. It cannot be cured, but it can be controlled with medications. DIAGNOSIS  If you are unable to determine the offending allergen, skin or blood testing may find it. TREATMENT   Avoid the allergen.  Medications and allergy shots (immunotherapy) can help.  Hay fever may often be treated with antihistamines in pill or nasal spray forms. Antihistamines block the effects of histamine. There are over-the-counter medicines that may help with nasal congestion and swelling around the eyes. Check with your caregiver before taking or giving this medicine. If the treatment above does not work, there are many new medications your caregiver can prescribe. Stronger medications may be used if initial measures are ineffective. Desensitizing injections can be used if medications and avoidance fails. Desensitization is when a patient is given ongoing shots until the body becomes less sensitive to the allergen. Make sure you follow up with your caregiver if problems continue. SEEK MEDICAL  CARE IF:   You develop fever (more than 100.5 F (38.1 C).  You develop a cough that does not stop easily (persistent).  You have shortness of breath.  You start wheezing.  Symptoms interfere with normal daily activities. Document Released: 01/05/2001 Document Revised: 07/05/2011 Document Reviewed: 07/17/2008 [facility] Patient Information 2014 [place], [place].  Upper Respiratory Infection, Adult An upper respiratory infection (URI) is also sometimes known as the common cold. The upper respiratory tract includes the nose, sinuses, throat, trachea, and bronchi. Bronchi are the airways leading to the lungs. Most people improve within 1 week, but symptoms can last up to 2 weeks. A residual cough may last even longer.  CAUSES Many different viruses can infect the tissues lining the upper respiratory tract. The tissues become irritated and inflamed and often become very moist. Mucus production is also common. A cold is contagious. You can easily spread the virus to others by oral contact. This includes kissing, sharing a glass, coughing, or sneezing. Touching your mouth or nose and then touching a surface, which is then touched by another person, can also spread the virus. SYMPTOMS  Symptoms typically develop 1 to 3 days after you come in contact with a cold virus. Symptoms vary from person to person. They may include:  Runny nose.  Sneezing.  Nasal congestion.  Sinus irritation.  Sore throat.  Loss of voice (laryngitis).  Cough.  Fatigue.  Muscle aches.  Loss of appetite.  Headache.  Low-grade fever. DIAGNOSIS  You might diagnose your own cold based on familiar symptoms, since most people get a cold 2 to 3 times a year. Your caregiver can confirm this based on your exam. Most importantly,  your caregiver can check that your symptoms are not due to another disease such as strep throat, sinusitis, pneumonia, asthma, or epiglottitis. Blood tests, throat tests, and X-rays are not  necessary to diagnose a common cold, but they may sometimes be helpful in excluding other more serious diseases. Your caregiver will decide if any further tests are required. RISKS AND COMPLICATIONS  You may be at risk for a more severe case of the common cold if you smoke cigarettes, have chronic heart disease (such as heart failure) or lung disease (such as asthma), or if you have a weakened immune system. The very young and very old are also at risk for more serious infections. Bacterial sinusitis, middle ear infections, and bacterial pneumonia can complicate the common cold. The common cold can worsen asthma and chronic obstructive pulmonary disease (COPD). Sometimes, these complications can require emergency medical care and may be life-threatening. PREVENTION  The best way to protect against getting a cold is to practice good hygiene. Avoid oral or hand contact with people with cold symptoms. Wash your hands often if contact occurs. There is no clear evidence that vitamin C, vitamin E, echinacea, or exercise reduces the chance of developing a cold. However, it is always recommended to get plenty of rest and practice good nutrition. TREATMENT  Treatment is directed at relieving symptoms. There is no cure. Antibiotics are not effective, because the infection is caused by a virus, not by bacteria. Treatment may include:  Increased fluid intake. Sports drinks offer valuable electrolytes, sugars, and fluids.  Breathing heated mist or steam (vaporizer or shower).  Eating chicken soup or other clear broths, and maintaining good nutrition.  Getting plenty of rest.  Using gargles or lozenges for comfort.  Controlling fevers with ibuprofen or acetaminophen as directed by your caregiver.  Increasing usage of your inhaler if you have asthma. Zinc gel and zinc lozenges, taken in the first 24 hours of the common cold, can shorten the duration and lessen the severity of symptoms. Pain medicines may help  with fever, muscle aches, and throat pain. A variety of non-prescription medicines are available to treat congestion and runny nose. Your caregiver can make recommendations and may suggest nasal or lung inhalers for other symptoms.  HOME CARE INSTRUCTIONS   Only take over-the-counter or prescription medicines for pain, discomfort, or fever as directed by your caregiver.  Use a warm mist humidifier or inhale steam from a shower to increase air moisture. This may keep secretions moist and make it easier to breathe.  Drink enough water and fluids to keep your urine clear or pale yellow.  Rest as needed.  Return to work when your temperature has returned to normal or as your caregiver advises. You may need to stay home longer to avoid infecting others. You can also use a face mask and careful hand washing to prevent spread of the virus. SEEK MEDICAL CARE IF:   After the first few days, you feel you are getting worse rather than better.  You need your caregiver's advice about medicines to control symptoms.  You develop chills, worsening shortness of breath, or brown or red sputum. These may be signs of pneumonia.  You develop yellow or brown nasal discharge or pain in the face, especially when you bend forward. These may be signs of sinusitis.  You develop a fever, swollen neck glands, pain with swallowing, or white areas in the back of your throat. These may be signs of strep throat. SEEK IMMEDIATE MEDICAL CARE IF:  You have a fever.  You develop severe or persistent headache, ear pain, sinus pain, or chest pain.  You develop wheezing, a prolonged cough, cough up blood, or have a change in your usual mucus (if you have chronic lung disease).  You develop sore muscles or a stiff neck. Document Released: 10/06/2000 Document Revised: 07/05/2011 Document Reviewed: 08/14/2010 [facility] Patient Information 2014 [place].

## 2013-04-27 NOTE — Progress Notes (Signed)
Patient tolerated well.

## 2013-04-30 ENCOUNTER — Ambulatory Visit
Admission: RE | Admit: 2013-04-30 | Discharge: 2013-04-30 | Disposition: A | Discharge status: Home / Self Care | Payer: 59 | Source: Ambulatory Visit | Attending: Allergy and Immunology | Admitting: Allergy and Immunology

## 2013-04-30 ENCOUNTER — Other Ambulatory Visit: Payer: Self-pay | Admitting: Allergy and Immunology

## 2013-04-30 ENCOUNTER — Other Ambulatory Visit: Payer: Self-pay | Admitting: Family Medicine

## 2013-04-30 ENCOUNTER — Telehealth: Payer: Self-pay | Admitting: Family Medicine

## 2013-04-30 DIAGNOSIS — J45901 Unspecified asthma with (acute) exacerbation: Secondary | ICD-10-CM

## 2013-05-01 ENCOUNTER — Ambulatory Visit: Payer: Medicare Other

## 2013-05-02 NOTE — Telephone Encounter (Signed)
No more allergy shots per patient

## 2013-05-08 ENCOUNTER — Ambulatory Visit: Payer: Medicare Other

## 2013-05-09 ENCOUNTER — Other Ambulatory Visit: Payer: Self-pay | Admitting: Family Medicine

## 2013-05-25 ENCOUNTER — Ambulatory Visit (INDEPENDENT_AMBULATORY_CARE_PROVIDER_SITE_OTHER): Payer: Medicare Other | Admitting: Internal Medicine

## 2013-05-25 ENCOUNTER — Encounter: Payer: Self-pay | Admitting: Internal Medicine

## 2013-05-25 VITALS — BP 150/72 | HR 62 | Ht 63.0 in | Wt 145.6 lb

## 2013-05-25 DIAGNOSIS — R911 Solitary pulmonary nodule: Secondary | ICD-10-CM

## 2013-05-25 NOTE — Patient Instructions (Signed)
[facility] Will call with results to decide next step

## 2013-05-25 NOTE — Progress Notes (Signed)
Subjective:    Patient ID: [name], female    DOB: [birth year], 80 y.o.   MRN: [id number] PCP Rudi HeapMOORE, DONALD, MD Allergist: Dr Irena CordsVan Winkle  HPI 80 year old pleasant female delivered a remote smoker. Her overall in good health. Referred by Dr. Irena CordsVan Winkle [facility] for [facility] of pulmonary nodule. She is a long history of allergies rhinitis, allergy conjunctivitis and asthma not otherwise specified. She is on immunotherapy for the same and is a Symbicort as needed. She's been a patient of Dr. Stevphen RochesterEugene  for many decades.  According to the grandneice and according to review of records sometime in the fall of 2014 she had a fall a CT head and neck with lung cut showed pulmonary nodule. Then on 02/28/2013 primary care physician Dr. Rudi Heaponald Moore did a CT scan of the chest that showed a right upper lobe 1.5 cm groundglass opacity[personally review the film ). She's been referred for the same. This is not captured in her recent chest x-ray  She denies any dyspnea, wheeze, cough currently or hemoptysis or weight loss.    reports that she has quit smoking. Her smoking use included Cigarettes. She has a .5 pack-year smoking history. She does not have any smokeless tobacco history on file.   Past Medical History  Diagnosis Date  . Atrophic vaginitis   . Malaise and fatigue   . Thoracic scoliosis   . Insomnia, idiopathic   . Essential hypertension, benign   . Gastritis   . Hiatal hernia   . Colon polyps     Dr Ewing SchleinMagod  . Diverticulosis     Dr Ewing SchleinMagod  . Other and unspecified hyperlipidemia   . Osteoarthrosis and allied disorders   . Abdominal pain      Family History  Problem Relation Age of Onset  . Heart disease Father [id number]  . Cancer Sister      History   Social History  . Marital Status: Divorced    Spouse Name: N/A    Number of Children: N/A  . Years of Education: N/A   Occupational History  . retired    Social History Main Topics  .  Smoking status: Former Smoker -- 0.10 packs/day for 5 years    Types: Cigarettes  . Smokeless tobacco: Not on file     Comment: pt states only smoke socially in teens  . Alcohol Use: No  . Drug Use: No  . Sexual Activity: Not on file   Other Topics Concern  . Not on file   Social History Narrative  . No narrative on file     Allergies  Allergen Reactions  . Ace Inhibitors Cough  . Zocor [Simvastatin] Other (See Comments)    Leg pain     Outpatient Prescriptions Prior to Visit  Medication Sig Dispense Refill  . albuterol (PROAIR HFA) 108 (90 BASE) MCG/ACT inhaler Inhale 2 puffs into the lungs every 6 (six) hours as needed.  1 Inhaler  3  . amLODipine (NORVASC) 10 MG tablet Take 1 tablet (10 mg total) by mouth daily.  30 tablet  3  . BENICAR 40 MG tablet TAKE 1 TABLET ONCE A DAY  30 tablet  3  . fluticasone (FLONASE) 50 MCG/ACT nasal spray 2 sprays by Nasal route daily.        . memantine (NAMENDA) 10 MG tablet Take 1 tablet (10 mg total) by mouth 2 (two) times daily.  180 tablet  4  . metoprolol (LOPRESSOR) 50 MG  tablet TAKE  (1)  TABLET TWICE A DAY.  60 tablet  5  . omeprazole (PRILOSEC) 20 MG capsule TAKE  (1)  CAPSULE  TWICE DAILY.  60 capsule  1  . donepezil (ARICEPT) 10 MG tablet TAKE 1 TABLET DAILY  30 tablet  2  . azithromycin (ZITHROMAX) 250 MG tablet Take as directed  6 tablet  0  . Mometasone Furo-Formoterol Fum (DULERA) 200-5 MCG/ACT AERO Take 2 puffs first thing in am and then another 2 puffs about 12 hours later.          No facility-administered medications prior to visit.      Review of Systems  Constitutional: Negative for fever and unexpected weight change.  HENT: Negative for congestion, dental problem, ear pain, nosebleeds, postnasal drip, rhinorrhea, sinus pressure, sneezing, sore throat and trouble swallowing.   Eyes: Negative for redness and itching.  Respiratory: Positive for shortness of breath. Negative for cough, chest tightness and wheezing.    Cardiovascular: Negative for palpitations and leg swelling.  Gastrointestinal: Negative for nausea and vomiting.  Genitourinary: Negative for dysuria.  Musculoskeletal: Negative for joint swelling.  Skin: Negative for rash.  Neurological: Negative for headaches.  Hematological: Does not bruise/bleed easily.  Psychiatric/Behavioral: Negative for dysphoric mood. The patient is not nervous/anxious.        Objective:   Physical Exam  Vitals reviewed. Constitutional: She is oriented to person, place, and time. She appears well-developed and well-nourished. No distress.  Body mass index is 25.8 kg/(m^2).   HENT:  Head: Normocephalic and atraumatic.  Right Ear: External ear normal.  Left Ear: External ear normal.  Mouth/Throat: Oropharynx is clear and moist. No oropharyngeal exudate.  Eyes: Conjunctivae and EOM are normal. Pupils are equal, round, and reactive to light. Right eye exhibits no discharge. Left eye exhibits no discharge. No scleral icterus.  Neck: Normal range of motion. Neck supple. No JVD present. No tracheal deviation present. No thyromegaly present.  Cardiovascular: Normal rate, regular rhythm, normal heart sounds and intact distal pulses.  Exam reveals no gallop and no friction rub.   No murmur heard. Pulmonary/Chest: Effort normal and breath sounds normal. No respiratory distress. She has no wheezes. She has no rales. She exhibits no tenderness.  kyphotic  Abdominal: Soft. Bowel sounds are normal. She exhibits no distension and no mass. There is no tenderness. There is no rebound and no guarding.  Musculoskeletal: Normal range of motion. She exhibits no edema and no tenderness.  Lymphadenopathy:    She has no cervical adenopathy.  Neurological: She is alert and oriented to person, place, and time. She has normal reflexes. No cranial nerve deficit. She exhibits normal muscle tone. Coordination normal.  Skin: Skin is warm and dry. No rash noted. She is not diaphoretic. No  erythema. No pallor.  Psychiatric: She has a normal mood and affect. Her behavior is normal. Judgment and thought content normal.          Assessment & Plan:

## 2013-05-27 DIAGNOSIS — R911 Solitary pulmonary nodule: Secondary | ICD-10-CM | POA: Insufficient documentation

## 2013-05-27 NOTE — Assessment & Plan Note (Signed)
She has a 1.5cm RUL nodule in the form of a ground glass opacity (GGO).  In a female in her 7180s, this could represent Bronchoalveolar Carcinoma (BAC) or benign causes. IF BAC, these typicaly are very slow growing (over 3 to 5 years)  And generally at her age death from othr causes likely (statistically speaking). So, in general I would favor a conservative approach which is part of her value anyways.   Will do CT chest now and depending on that further serail CT chest v biopsy  She and grand-niece updated in full. AGreeable with plan

## 2013-06-01 ENCOUNTER — Ambulatory Visit (INDEPENDENT_AMBULATORY_CARE_PROVIDER_SITE_OTHER)
Admission: RE | Admit: 2013-06-01 | Discharge: 2013-06-01 | Disposition: A | Discharge status: Home / Self Care | Payer: Medicare Other | Source: Ambulatory Visit | Attending: Internal Medicine | Admitting: Internal Medicine

## 2013-06-01 DIAGNOSIS — R911 Solitary pulmonary nodule: Secondary | ICD-10-CM

## 2013-06-08 ENCOUNTER — Telehealth: Payer: Self-pay | Admitting: Internal Medicine

## 2013-06-08 DIAGNOSIS — R911 Solitary pulmonary nodule: Secondary | ICD-10-CM

## 2013-06-08 NOTE — Telephone Encounter (Signed)
Sabrina ShanMurali Ramaswamy, MD at 06/08/2013 12:59 PM     Status: Signed        reviewd CT chest from 06/01/13: the right upper lobe spot is still there and looks the same. Therefore, this could be sacr tissue or slow growing BAC type of cancer seen in women in her age group. Please tell grand niece ? megan who came with her this. I will see her in 6 months with CT chest wo contrast. Please set this up   Daughter aware of recs. Order and appt recall placed. Nothing further needed.

## 2013-06-08 NOTE — Telephone Encounter (Signed)
See other phone note 06/08/13

## 2013-06-08 NOTE — Telephone Encounter (Signed)
reviewd CT chest from 06/01/13: the right upper lobe spot is still there and looks the same. Therefore, this could be sacr tissue or slow growing BAC type of cancer seen in women in her age group. Please tell grand niece ? megan who came with her this. I will see her in 6 months with CT chest wo contrast. Please set this up  Thanks  Dr. Kalman ShanMurali Breionna Punt, M.D., [facility]C.P Pulmonary and Critical Care Medicine Staff Physician [facility] Pager: [phone], If no answer or between  15:00h - 7:00h: call [phone]  06/08/2013 1:01 PM

## 2013-06-13 ENCOUNTER — Ambulatory Visit: Payer: Medicare Other | Admitting: Family Medicine

## 2013-06-18 ENCOUNTER — Other Ambulatory Visit: Payer: Self-pay | Admitting: Cardiovascular Disease

## 2013-06-27 ENCOUNTER — Ambulatory Visit (INDEPENDENT_AMBULATORY_CARE_PROVIDER_SITE_OTHER): Payer: Medicare Other | Admitting: General Practice

## 2013-06-27 ENCOUNTER — Telehealth: Payer: Self-pay | Admitting: Family Medicine

## 2013-06-27 VITALS — BP 141/57 | HR 68 | Temp 98.6°F | Ht 63.0 in | Wt 141.0 lb

## 2013-06-27 DIAGNOSIS — J209 Acute bronchitis, unspecified: Secondary | ICD-10-CM

## 2013-06-27 MED ORDER — PREDNISONE (PAK) 10 MG PO TABS: ORAL_TABLET | ORAL | Status: DC

## 2013-06-27 NOTE — Telephone Encounter (Signed)
Appt scheduled. Patient aware. 

## 2013-06-27 NOTE — Progress Notes (Signed)
   Subjective:    Patient ID: [name], female    DOB: 06-May-1924, 80 y.o.   MRN: [id number]  Cough This is a new problem. The current episode started in the past 7 days. The problem has been unchanged. The cough is non-productive. Associated symptoms include wheezing. Pertinent negatives include no chest pain, chills, fever, nasal congestion or shortness of breath. The symptoms are aggravated by lying down. She has tried a beta-agonist inhaler for the symptoms. Her past medical history is significant for asthma. There is no history of bronchitis or pneumonia.      Review of Systems  Constitutional: Negative for fever and chills.  Respiratory: Positive for cough and wheezing. Negative for shortness of breath.   Cardiovascular: Negative for chest pain and palpitations.       Objective:   Physical Exam  Constitutional: She is oriented to person, place, and time. She appears well-developed and well-nourished.  Cardiovascular: Normal rate, regular rhythm and normal heart sounds.   Pulmonary/Chest: Effort normal. No respiratory distress. She has wheezes in the right upper field. She exhibits no tenderness.  Neurological: She is alert and oriented to person, place, and time.          Assessment & Plan:  1. Acute bronchitis - Continue (prednisone dose pack) medication prescribed today by another provider -avoid irritants -RTO if symptoms worsen or unresolved -may seek emergency medical treatment Patient verbalized understanding Coralie KeensMae E. Son Barkan, FNP-C

## 2013-06-27 NOTE — Patient Instructions (Signed)

## 2013-07-05 ENCOUNTER — Other Ambulatory Visit: Payer: Self-pay | Admitting: General Practice

## 2013-07-20 ENCOUNTER — Other Ambulatory Visit: Payer: Self-pay | Admitting: Family Medicine

## 2013-07-23 ENCOUNTER — Ambulatory Visit: Payer: Medicare Other | Admitting: Family Medicine

## 2013-08-14 ENCOUNTER — Telehealth: Payer: Self-pay | Admitting: Family Medicine

## 2013-08-14 MED ORDER — DONEPEZIL HCL 10 MG PO TABS: ORAL_TABLET | ORAL | Status: DC

## 2013-08-14 MED ORDER — MEMANTINE HCL 10 MG PO TABS: 10.0000 mg | ORAL_TABLET | Freq: Two times a day (BID) | ORAL | Status: DC

## 2013-08-14 MED ORDER — METOPROLOL TARTRATE 50 MG PO TABS: ORAL_TABLET | ORAL | Status: DC

## 2013-08-14 MED ORDER — OLMESARTAN MEDOXOMIL 40 MG PO TABS: ORAL_TABLET | ORAL | Status: DC

## 2013-08-14 NOTE — Telephone Encounter (Signed)
This has already been discussed with andrea.  We will rf meds until after surg.

## 2013-08-15 ENCOUNTER — Ambulatory Visit: Payer: Medicare Other | Admitting: Family Medicine

## 2013-08-28 ENCOUNTER — Ambulatory Visit (INDEPENDENT_AMBULATORY_CARE_PROVIDER_SITE_OTHER): Payer: 59 | Admitting: Cardiology

## 2013-08-28 ENCOUNTER — Encounter: Payer: Self-pay | Admitting: Cardiology

## 2013-08-28 VITALS — BP 150/75 | HR 61 | Ht 65.5 in | Wt 144.0 lb

## 2013-08-28 DIAGNOSIS — I10 Essential (primary) hypertension: Secondary | ICD-10-CM

## 2013-08-28 DIAGNOSIS — Z01810 Encounter for preprocedural cardiovascular examination: Secondary | ICD-10-CM

## 2013-08-28 NOTE — Patient Instructions (Signed)
Continue all current medications. Follow up as needed  

## 2013-08-28 NOTE — Progress Notes (Addendum)
Clinical Summary Ms. Tyler DeisWheeler is a 80 y.o.female last seen by Dr Kirke CorinArida, this is our first visit together.  1. Preoperative Evaluation - patient being considered for left shoulder surgery planned - she has no prior cardiac history. Seen previously in our clinic for DOE, Lexiscan MPI 06/2012 without any evidence of ischemia - since that time she describes some occasional DOE that is relieved with prn inhalers.  - denies any chest pain, no palpitations.  - no LE edema, no orthpnea, no PND    Past Medical History  Diagnosis Date  . Atrophic vaginitis   . Malaise and fatigue   . Thoracic scoliosis   . Insomnia, idiopathic   . Essential hypertension, benign   . Gastritis   . Hiatal hernia   . Colon polyps     Dr Ewing SchleinMagod  . Diverticulosis     Dr Ewing SchleinMagod  . Other and unspecified hyperlipidemia   . Osteoarthrosis and allied disorders   . Abdominal pain      Allergies  Allergen Reactions  . Ace Inhibitors Cough  . Zocor [Simvastatin] Other (See Comments)    Leg pain     Current Outpatient Prescriptions  Medication Sig Dispense Refill  . albuterol (PROAIR HFA) 108 (90 BASE) MCG/ACT inhaler Inhale 2 puffs into the lungs every 6 (six) hours as needed.  1 Inhaler  3  . amLODipine (NORVASC) 10 MG tablet TAKE 1 TABLET DAILY  30 tablet  1  . donepezil (ARICEPT) 10 MG tablet Take 10 mg by mouth at bedtime.      . donepezil (ARICEPT) 10 MG tablet TAKE 1 TABLET DAILY  30 tablet  5  . fluticasone (FLONASE) 50 MCG/ACT nasal spray 2 sprays by Nasal route daily.        . memantine (NAMENDA) 10 MG tablet Take 1 tablet (10 mg total) by mouth 2 (two) times daily.  180 tablet  4  . metoprolol (LOPRESSOR) 50 MG tablet TAKE  (1)  TABLET TWICE A DAY.  60 tablet  5  . olmesartan (BENICAR) 40 MG tablet TAKE 1 TABLET ONCE A DAY  30 tablet  5  . omeprazole (PRILOSEC) 20 MG capsule TAKE (1) CAPSULE TWICE DAILY.  60 capsule  1  . SYMBICORT 80-4.5 MCG/ACT inhaler Inhale 2 puffs into the lungs 2 (two)  times daily.       No current facility-administered medications for this visit.     Past Surgical History  Procedure Laterality Date  . Joint replacement    . Rt knee   10 1 08    Dr Thomasena Edisollins  . Rt rotator  cuff repair  4 1 00     Allergies  Allergen Reactions  . Ace Inhibitors Cough  . Zocor [Simvastatin] Other (See Comments)    Leg pain      Family History  Problem Relation Age of Onset  . Heart disease Father 7757  . Cancer Sister      Social History Ms. Tyler DeisWheeler reports that she has quit smoking. Her smoking use included Cigarettes. She has a .5 pack-year smoking history. She does not have any smokeless tobacco history on file. Ms. Tyler DeisWheeler reports that she does not drink alcohol.   Review of Systems CONSTITUTIONAL: No weight loss, fever, chills, weakness or fatigue.  HEENT: Eyes: No visual loss, blurred vision, double vision or yellow sclerae.No hearing loss, sneezing, congestion, runny nose or sore throat.  SKIN: No rash or itching.  CARDIOVASCULAR: per HPI RESPIRATORY: No shortness  of breath, cough or sputum.  GASTROINTESTINAL: No anorexia, nausea, vomiting or diarrhea. No abdominal pain or blood.  GENITOURINARY: No burning on urination, no polyuria NEUROLOGICAL: No headache, dizziness, syncope, paralysis, ataxia, numbness or tingling in the extremities. No change in bowel or bladder control.  MUSCULOSKELETAL: shoulder pain LYMPHATICS: No enlarged nodes. No history of splenectomy.  PSYCHIATRIC: No history of depression or anxiety.  ENDOCRINOLOGIC: No reports of sweating, cold or heat intolerance. No polyuria or polydipsia.  Marland Kitchen.   Physical Examination p 61 bp 145/70 Wt 144 lbs BMI 24 Gen: resting comfortably, no acute distress HEENT: no scleral icterus, pupils equal round and reactive, no palptable cervical adenopathy,  CV: RRR, no m/r/g, no JVD, no carotid bruits Resp: Clear to auscultation bilaterally GI: abdomen is soft, non-tender, non-distended, normal  bowel sounds, no hepatosplenomegaly MSK: extremities are warm, no edema.  Skin: warm, no rash Neuro:  no focal deficits Psych: appropriate affect    08/28/13 EKG NSR  Assessment and Plan  1. Preoperative evaluation - patient is being considered for an intermediate risk ortho surgery. She has no active acute cardiac conditions. She is able to tolerate >4METs. Normal Lexiscan MPI last year with no ischemia. - recommend proceeding with surgery as planned, no prior cardiac testing or intervention is warranted at this time   Follow up as need.       Antoine PocheJonathan F. Su Duma, M.D., F.A.C.C.

## 2013-09-13 ENCOUNTER — Ambulatory Visit: Payer: Medicare Other | Admitting: Family Medicine

## 2013-11-05 ENCOUNTER — Ambulatory Visit: Payer: Medicare Other | Admitting: Family Medicine

## 2013-11-07 ENCOUNTER — Encounter: Payer: Self-pay | Admitting: Family Medicine

## 2013-11-07 ENCOUNTER — Ambulatory Visit (INDEPENDENT_AMBULATORY_CARE_PROVIDER_SITE_OTHER): Payer: Medicare Other | Admitting: Family Medicine

## 2013-11-07 VITALS — BP 145/62 | HR 66 | Temp 99.0°F | Ht 65.5 in | Wt 141.0 lb

## 2013-11-07 DIAGNOSIS — Z09 Encounter for follow-up examination after completed treatment for conditions other than malignant neoplasm: Secondary | ICD-10-CM

## 2013-11-07 DIAGNOSIS — IMO0002 Reserved for concepts with insufficient information to code with codable children: Secondary | ICD-10-CM

## 2013-11-07 DIAGNOSIS — L209 Atopic dermatitis, unspecified: Secondary | ICD-10-CM

## 2013-11-07 DIAGNOSIS — L2089 Other atopic dermatitis: Secondary | ICD-10-CM

## 2013-11-07 DIAGNOSIS — M75100 Unspecified rotator cuff tear or rupture of unspecified shoulder, not specified as traumatic: Secondary | ICD-10-CM

## 2013-11-07 DIAGNOSIS — R413 Other amnesia: Secondary | ICD-10-CM

## 2013-11-07 DIAGNOSIS — Z9889 Other specified postprocedural states: Secondary | ICD-10-CM

## 2013-11-07 DIAGNOSIS — Z0000 Encounter for general adult medical examination without abnormal findings: Secondary | ICD-10-CM

## 2013-11-07 NOTE — Patient Instructions (Signed)
Continue physical therapy and followup with orthopedic surgeon Continue to be careful and do not put yourself at risk for falling You must get a [employer]--- we have given your niece information about this Apply cortisone 10 sparingly to affected rash twice daily for 7 days

## 2013-11-07 NOTE — Progress Notes (Signed)
Subjective:    Patient ID: [name], female    DOB: [birth year], 80 y.o.   MRN: [id number]  HPI Patient here today for hospital and nursing rehabilitation follow up. Patient had left rotator cuff shoulder surgery by Dr Case on 09/21/13 and then went to [facility] for rehab. She comes to the visit today with one of her great-nieces, [name]. She complains of some general joint aches and pains and also a rash on her face. Please see hospital history and physical report. Since being discharged from the rehabilitation center she has been at home for about a week and a half. She is getting home physical therapy twice weekly occupational therapy and speech therapy. Her arm is still in a sling but she has good grip with the affected arm and hand.          Patient Active Problem List   Diagnosis Date Noted  . Nodule of right lung 05/27/2013  . Memory impairment 03/12/2013  . Prophylactic immunotherapy 10/24/2012  . Dyspnea 07/06/2012  . Extrinsic asthma 08/31/2010  . Atrophic vaginitis   . Malaise and fatigue   . Thoracic scoliosis   . Insomnia, idiopathic   . Essential hypertension, benign   . Gastritis   . Hiatal hernia   . Colon polyps   . Diverticulosis   . Osteoarthrosis and allied disorders   . Abdominal pain   . DYSLIPIDEMIA 02/11/2009  . HYPERTENSION 02/10/2009  . GERD 02/10/2009   Outpatient Encounter Prescriptions as of 11/07/2013  Medication Sig  . albuterol (PROAIR HFA) 108 (90 BASE) MCG/ACT inhaler Inhale 2 puffs into the lungs every 6 (six) hours as needed.  Marland Kitchen amLODipine (NORVASC) 10 MG tablet TAKE 1 TABLET DAILY  . aspirin EC 81 MG tablet Take 81 mg by mouth daily.  Marland Kitchen donepezil (ARICEPT) 10 MG tablet Take 10 mg by mouth at bedtime.  . fluticasone (FLONASE) 50 MCG/ACT nasal spray 2 sprays by Nasal route daily.    . memantine (NAMENDA) 10 MG tablet Take 1 tablet (10 mg total) by mouth 2 (two) times daily.  . metoprolol (LOPRESSOR) 50 MG tablet TAKE   (1)  TABLET TWICE A DAY.  Marland Kitchen olmesartan (BENICAR) 40 MG tablet TAKE 1 TABLET ONCE A DAY  . omeprazole (PRILOSEC) 20 MG capsule TAKE (1) CAPSULE TWICE DAILY.  . SYMBICORT 80-4.5 MCG/ACT inhaler Inhale 2 puffs into the lungs 2 (two) times daily.    Review of Systems  Constitutional: Negative.   HENT: Negative.   Eyes: Negative.   Respiratory: Negative.   Cardiovascular: Negative.   Gastrointestinal: Negative.   Endocrine: Negative.   Genitourinary: Negative.   Musculoskeletal: Positive for arthralgias.  Skin: Positive for rash (on face).  Allergic/Immunologic: Negative.   Neurological: Negative.   Hematological: Negative.   Psychiatric/Behavioral: Negative.        Objective:   Physical Exam  Nursing note and vitals reviewed. Constitutional: She is oriented to person, place, and time. No distress.  Elderly and somewhat more frail appearing than when last seen. She was alert and seemed to respond appropriately to discussion in the exam room.  HENT:  Head: Normocephalic and atraumatic.  Left Ear: External ear normal.  Nose: Nose normal.  Mouth/Throat: Oropharynx is clear and moist. No oropharyngeal exudate.  Ear cerumen right ear canal  Eyes: Conjunctivae and EOM are normal. Pupils are equal, round, and reactive to light. Right eye exhibits no discharge. Left eye exhibits no discharge. No scleral icterus.  Neck: Normal range  of motion. Neck supple. No thyromegaly present.  No carotid bruits  Cardiovascular: Normal rate, regular rhythm, normal heart sounds and intact distal pulses.  Exam reveals no gallop and no friction rub.   No murmur heard. At 84 per minute  Pulmonary/Chest: Effort normal and breath sounds normal. No respiratory distress. She has no wheezes. She has no rales. She exhibits no tenderness.  Abdominal: Soft. Bowel sounds are normal. She exhibits no mass. There is no tenderness. There is no rebound and no guarding.  Musculoskeletal: Normal range of motion. She  exhibits no edema and no tenderness.  Left arm is in a sling. She does have good grip strength with the left hand. She is able to use the right hand and arm without restriction.  Lymphadenopathy:    She has no cervical adenopathy.  Neurological: She is alert and oriented to person, place, and time.  Skin: Skin is warm and dry. There is pallor.  Psychiatric: She has a normal mood and affect. Her behavior is normal. Judgment and thought content normal.  Minimally depressed   BP 145/62  Pulse 66  Temp(Src) 99 F (37.2 C) (Oral)  Ht 5' 5.5" (1.664 m)  Wt 141 lb (63.957 kg)  BMI 23.10 kg/m2        Assessment & Plan:  1. Health care maintenance - POCT CBC - BMP8+EGFR - Hepatic function panel  2. Hospital discharge follow-up - POCT CBC - BMP8+EGFR - Hepatic function panel  3. H/O repair of left rotator cuff  4. Memory impairment  5. Atopic dermatitis  No orders of the defined types were placed in this encounter.   Patient Instructions  Continue physical therapy and followup with orthopedic surgeon Continue to be careful and do not put yourself at risk for falling You must get a Lifeline--- we have given your niece information about this Apply cortisone 10 sparingly to affected rash twice daily for 7 days    Arrie Senate MD

## 2013-11-07 NOTE — Addendum Note (Signed)
Addended by: Orma RenderHODGES, Nerea Bordenave F on: 11/07/2013 04:24 PM   Modules accepted: Orders

## 2013-11-08 ENCOUNTER — Telehealth: Payer: Self-pay | Admitting: Family Medicine

## 2013-11-08 LAB — HEPATIC FUNCTION PANEL
ALT: 12 IU/L (ref 0–32)
AST: 21 IU/L (ref 0–40)
Albumin: 4.2 g/dL (ref 3.5–4.7)
Alkaline Phosphatase: 59 IU/L (ref 39–117)
Bilirubin, Direct: 0.08 mg/dL (ref 0.00–0.40)
Total Protein: 6.5 g/dL (ref 6.0–8.5)

## 2013-11-08 LAB — CBC WITH DIFFERENTIAL
BASOS: 1 %
Basophils Absolute: 0 10*3/uL (ref 0.0–0.2)
EOS ABS: 0.1 10*3/uL (ref 0.0–0.4)
Eos: 1 %
HCT: 34.5 % (ref 34.0–46.6)
HEMOGLOBIN: 11.4 g/dL (ref 11.1–15.9)
Immature Grans (Abs): 0 10*3/uL (ref 0.0–0.1)
Immature Granulocytes: 0 %
LYMPHS ABS: 1.8 10*3/uL (ref 0.7–3.1)
Lymphs: 26 %
MCH: 29.3 pg (ref 26.6–33.0)
MCHC: 33 g/dL (ref 31.5–35.7)
MCV: 89 fL (ref 79–97)
MONOS ABS: 0.7 10*3/uL (ref 0.1–0.9)
Monocytes: 10 %
NEUTROS PCT: 62 %
Neutrophils Absolute: 4.3 10*3/uL (ref 1.4–7.0)
PLATELETS: 318 10*3/uL (ref 150–379)
RBC: 3.89 x10E6/uL (ref 3.77–5.28)
RDW: 13.4 % (ref 12.3–15.4)
WBC: 6.8 10*3/uL (ref 3.4–10.8)

## 2013-11-08 LAB — BMP8+EGFR
BUN / CREAT RATIO: 23 (ref 11–26)
BUN: 15 mg/dL (ref 8–27)
CO2: 25 mmol/L (ref 18–29)
Calcium: 9.4 mg/dL (ref 8.7–10.3)
Chloride: 92 mmol/L — ABNORMAL LOW (ref 97–108)
Creatinine, Ser: 0.64 mg/dL (ref 0.57–1.00)
GFR calc non Af Amer: 81 mL/min/{1.73_m2} (ref >59)
GFR, EST AFRICAN AMERICAN: 93 mL/min/{1.73_m2} (ref >59)
Glucose: 86 mg/dL (ref 65–99)
Potassium: 4.6 mmol/L (ref 3.5–5.2)
Sodium: 132 mmol/L — ABNORMAL LOW (ref 134–144)

## 2013-11-08 NOTE — Telephone Encounter (Signed)
VO given per Dr. Christell ConstantMoore

## 2013-11-23 DIAGNOSIS — J45909 Unspecified asthma, uncomplicated: Secondary | ICD-10-CM

## 2013-11-23 DIAGNOSIS — IMO0002 Reserved for concepts with insufficient information to code with codable children: Secondary | ICD-10-CM

## 2013-11-23 DIAGNOSIS — I10 Essential (primary) hypertension: Secondary | ICD-10-CM

## 2013-11-23 DIAGNOSIS — Z4889 Encounter for other specified surgical aftercare: Secondary | ICD-10-CM

## 2013-11-23 DIAGNOSIS — F0390 Unspecified dementia without behavioral disturbance: Secondary | ICD-10-CM

## 2013-11-23 DIAGNOSIS — M1710 Unilateral primary osteoarthritis, unspecified knee: Secondary | ICD-10-CM

## 2013-12-11 ENCOUNTER — Ambulatory Visit: Payer: Medicare Other | Attending: Orthopedic Surgery | Admitting: Physical Therapy

## 2013-12-11 DIAGNOSIS — IMO0001 Reserved for inherently not codable concepts without codable children: Secondary | ICD-10-CM | POA: Insufficient documentation

## 2013-12-11 DIAGNOSIS — M25519 Pain in unspecified shoulder: Secondary | ICD-10-CM | POA: Insufficient documentation

## 2013-12-11 DIAGNOSIS — R5381 Other malaise: Secondary | ICD-10-CM | POA: Diagnosis not present

## 2013-12-11 DIAGNOSIS — M25619 Stiffness of unspecified shoulder, not elsewhere classified: Secondary | ICD-10-CM | POA: Insufficient documentation

## 2013-12-13 ENCOUNTER — Ambulatory Visit: Payer: Medicare Other | Admitting: Physical Therapy

## 2013-12-13 DIAGNOSIS — IMO0001 Reserved for inherently not codable concepts without codable children: Secondary | ICD-10-CM | POA: Diagnosis not present

## 2013-12-14 ENCOUNTER — Ambulatory Visit (INDEPENDENT_AMBULATORY_CARE_PROVIDER_SITE_OTHER)
Admission: RE | Admit: 2013-12-14 | Discharge: 2013-12-14 | Disposition: A | Discharge status: Home / Self Care | Payer: 59 | Source: Ambulatory Visit | Attending: Internal Medicine | Admitting: Internal Medicine

## 2013-12-14 DIAGNOSIS — R911 Solitary pulmonary nodule: Secondary | ICD-10-CM

## 2013-12-17 ENCOUNTER — Telehealth: Payer: Self-pay | Admitting: Internal Medicine

## 2013-12-17 ENCOUNTER — Ambulatory Visit: Payer: Medicare Other | Admitting: Physical Therapy

## 2013-12-17 DIAGNOSIS — R911 Solitary pulmonary nodule: Secondary | ICD-10-CM

## 2013-12-17 DIAGNOSIS — IMO0001 Reserved for inherently not codable concepts without codable children: Secondary | ICD-10-CM | POA: Diagnosis not present

## 2013-12-17 NOTE — Telephone Encounter (Signed)
For Casper Harrison  Reviewed ct chest from Feb 2015 and compared to aug 2015 results - no change in nodule  Plan  - there is no need for her tro come and see me for this CT scan   - return in 9 months with ct chest wo contrast in 9 months for fu of RUL nodul   Thanks  Dr. Kalman Shan, M.D., [facility].C.P Pulmonary and Critical Care Medicine Staff Physician [facility]  Pulmonary and Critical Care Pager: [phone], If no answer or between  15:00h - 7:00h: call [phone]  12/17/2013 5:53 AM   IMPRESSION:  Stable asymmetric right apical scarring and sub-solid pulmonary  nodule in the right lung apex. Continued yearly followup by CT is  recommended. This recommendation follows the consensus statement:  Recommendations for the Management of Subsolid Pulmonary Nodules  Detected at CT: A Statement from the Fleischner Society. Radiology  2013;266:304-317.  Electronically Signed  By: Myles Rosenthal M.D.  On: 12/14/2013 16:42

## 2013-12-19 ENCOUNTER — Ambulatory Visit: Payer: Medicare Other | Admitting: Physical Therapy

## 2013-12-19 DIAGNOSIS — IMO0001 Reserved for inherently not codable concepts without codable children: Secondary | ICD-10-CM | POA: Diagnosis not present

## 2013-12-19 NOTE — Telephone Encounter (Signed)
Called and spoke to pt. Informed pt of results and recs per MR. Pt verbalized understanding and denied any further questions or concerns at this time. Recall placed. CT order placed. Nothing further needed.

## 2013-12-24 ENCOUNTER — Ambulatory Visit: Payer: Medicare Other | Admitting: Physical Therapy

## 2013-12-24 DIAGNOSIS — IMO0001 Reserved for inherently not codable concepts without codable children: Secondary | ICD-10-CM | POA: Diagnosis not present

## 2013-12-26 ENCOUNTER — Ambulatory Visit: Payer: Medicare Other | Attending: Orthopedic Surgery | Admitting: Physical Therapy

## 2013-12-26 DIAGNOSIS — R5381 Other malaise: Secondary | ICD-10-CM | POA: Insufficient documentation

## 2013-12-26 DIAGNOSIS — IMO0001 Reserved for inherently not codable concepts without codable children: Secondary | ICD-10-CM | POA: Insufficient documentation

## 2013-12-26 DIAGNOSIS — M25619 Stiffness of unspecified shoulder, not elsewhere classified: Secondary | ICD-10-CM | POA: Diagnosis not present

## 2013-12-26 DIAGNOSIS — M25519 Pain in unspecified shoulder: Secondary | ICD-10-CM | POA: Insufficient documentation

## 2014-01-01 ENCOUNTER — Ambulatory Visit: Payer: Medicare Other | Admitting: Physical Therapy

## 2014-01-01 DIAGNOSIS — IMO0001 Reserved for inherently not codable concepts without codable children: Secondary | ICD-10-CM | POA: Diagnosis not present

## 2014-01-03 ENCOUNTER — Ambulatory Visit: Payer: Medicare Other | Admitting: Physical Therapy

## 2014-01-03 DIAGNOSIS — IMO0001 Reserved for inherently not codable concepts without codable children: Secondary | ICD-10-CM | POA: Diagnosis not present

## 2014-01-07 ENCOUNTER — Telehealth: Payer: Self-pay | Admitting: Family Medicine

## 2014-01-07 ENCOUNTER — Encounter: Payer: Self-pay | Admitting: Family Medicine

## 2014-01-07 ENCOUNTER — Ambulatory Visit (INDEPENDENT_AMBULATORY_CARE_PROVIDER_SITE_OTHER): Payer: Medicare Other | Admitting: Family Medicine

## 2014-01-07 ENCOUNTER — Ambulatory Visit: Payer: Medicare Other | Admitting: Physical Therapy

## 2014-01-07 VITALS — BP 123/61 | HR 53 | Temp 97.0°F | Ht 65.5 in | Wt 138.0 lb

## 2014-01-07 DIAGNOSIS — R413 Other amnesia: Secondary | ICD-10-CM

## 2014-01-07 DIAGNOSIS — Z13820 Encounter for screening for osteoporosis: Secondary | ICD-10-CM

## 2014-01-07 DIAGNOSIS — R5383 Other fatigue: Secondary | ICD-10-CM

## 2014-01-07 DIAGNOSIS — E559 Vitamin D deficiency, unspecified: Secondary | ICD-10-CM

## 2014-01-07 DIAGNOSIS — IMO0001 Reserved for inherently not codable concepts without codable children: Secondary | ICD-10-CM | POA: Diagnosis not present

## 2014-01-07 DIAGNOSIS — E785 Hyperlipidemia, unspecified: Secondary | ICD-10-CM

## 2014-01-07 DIAGNOSIS — R9389 Abnormal findings on diagnostic imaging of other specified body structures: Secondary | ICD-10-CM

## 2014-01-07 DIAGNOSIS — K219 Gastro-esophageal reflux disease without esophagitis: Secondary | ICD-10-CM

## 2014-01-07 DIAGNOSIS — I10 Essential (primary) hypertension: Secondary | ICD-10-CM

## 2014-01-07 LAB — POCT CBC
Granulocyte percent: 74.2 %G (ref 37–80)
HEMATOCRIT: 38.4 % (ref 37.7–47.9)
HEMOGLOBIN: 12.5 g/dL (ref 12.2–16.2)
Lymph, poc: 1.6 (ref 0.6–3.4)
MCH, POC: 28.5 pg (ref 27–31.2)
MCHC: 32.7 g/dL (ref 31.8–35.4)
MCV: 87.3 fL (ref 80–97)
MPV: 8 fL (ref 0–99.8)
POC GRANULOCYTE: 5.6 (ref 2–6.9)
POC LYMPH %: 21 % (ref 10–50)
Platelet Count, POC: 294 10*3/uL (ref 142–424)
RBC: 4.4 M/uL (ref 4.04–5.48)
RDW, POC: 13.6 %
WBC: 7.5 10*3/uL (ref 4.6–10.2)

## 2014-01-07 NOTE — Progress Notes (Signed)
Subjective:    Patient ID: [name], female    DOB: 06/26/1924, 80 y.o.   MRN: [id number]  HPI Pt here for follow up and management of chronic medical problems. The patient comes to the visit today with her niece. Her biggest issues are her memory and this history of a right pulmonary nodule and she has been seeing the pulmonologist about this. The patient is also due for a pelvic exam and a DEXA scan and additional lab work. She will need to return and FOBT.         Patient Active Problem List   Diagnosis Date Noted  . Nodule of right lung 05/27/2013  . Memory impairment 03/12/2013  . Prophylactic immunotherapy 10/24/2012  . Dyspnea 07/06/2012  . Extrinsic asthma 08/31/2010  . Atrophic vaginitis   . Malaise and fatigue   . Thoracic scoliosis   . Insomnia, idiopathic   . Essential hypertension, benign   . Gastritis   . Hiatal hernia   . Colon polyps   . Diverticulosis   . Osteoarthrosis and allied disorders   . Abdominal pain   . DYSLIPIDEMIA 02/11/2009  . HYPERTENSION 02/10/2009  . GERD 02/10/2009   Outpatient Encounter Prescriptions as of 01/07/2014  Medication Sig  . albuterol (PROAIR HFA) 108 (90 BASE) MCG/ACT inhaler Inhale 2 puffs into the lungs every 6 (six) hours as needed.  Marland Kitchen amLODipine (NORVASC) 10 MG tablet TAKE 1 TABLET DAILY  . aspirin EC 81 MG tablet Take 81 mg by mouth daily.  Marland Kitchen donepezil (ARICEPT) 10 MG tablet Take 10 mg by mouth at bedtime.  . fluticasone (FLONASE) 50 MCG/ACT nasal spray 2 sprays by Nasal route daily.    . memantine (NAMENDA) 10 MG tablet Take 1 tablet (10 mg total) by mouth 2 (two) times daily.  . metoprolol (LOPRESSOR) 50 MG tablet TAKE  (1)  TABLET TWICE A DAY.  Marland Kitchen olmesartan (BENICAR) 40 MG tablet TAKE 1 TABLET ONCE A DAY  . omeprazole (PRILOSEC) 20 MG capsule TAKE (1) CAPSULE TWICE DAILY.  . SYMBICORT 80-4.5 MCG/ACT inhaler Inhale 2 puffs into the lungs 2 (two) times daily.    Review of Systems  Constitutional:  Negative.   HENT: Negative.   Eyes: Negative.   Respiratory: Negative.   Cardiovascular: Negative.   Gastrointestinal: Negative.   Endocrine: Negative.   Genitourinary: Negative.   Musculoskeletal: Positive for arthralgias (at times).  Skin: Negative.   Allergic/Immunologic: Negative.   Neurological: Negative.   Hematological: Negative.   Psychiatric/Behavioral: Negative.        Objective:   Physical Exam  Nursing note and vitals reviewed. Constitutional: She is oriented to person, place, and time. She appears well-developed and well-nourished. No distress.  The patient is doing well for her age other than her memory issues and seems to be pretty alert today   HENT:  Head: Normocephalic and atraumatic.  Right Ear: External ear normal.  Left Ear: External ear normal.  Nose: Nose normal.  Mouth/Throat: Oropharynx is clear and moist.  Clear rhinorrhea  Eyes: Conjunctivae and EOM are normal. Pupils are equal, round, and reactive to light. Right eye exhibits no discharge. Left eye exhibits no discharge. No scleral icterus.  Neck: Normal range of motion. Neck supple. No thyromegaly present.  No carotid bruits  Cardiovascular: Normal rate, regular rhythm, normal heart sounds and intact distal pulses.  Exam reveals no gallop and no friction rub.   No murmur heard. At 60 per minute  Pulmonary/Chest: Effort normal and breath  sounds normal. No respiratory distress. She has no wheezes. She has no rales. She exhibits no tenderness.  Dry cough  Abdominal: Soft. Bowel sounds are normal. She exhibits no mass. There is no tenderness. There is no rebound and no guarding.  Musculoskeletal: Normal range of motion. She exhibits no edema and no tenderness.  Lymphadenopathy:    She has no cervical adenopathy.  Neurological: She is alert and oriented to person, place, and time. She has normal reflexes. No cranial nerve deficit.  Skin: Skin is warm and dry. No rash noted.  Psychiatric: She has a  normal mood and affect. Her behavior is normal. Judgment and thought content normal.   BP 123/61  Pulse 53  Temp(Src) 97 F (36.1 C) (Oral)  Ht 5' 5.5" (1.664 m)  Wt 138 lb (62.596 kg)  BMI 22.61 kg/m2        Assessment & Plan:  1. DYSLIPIDEMIA - POCT CBC - Lipid panel  2. HYPERTENSION - POCT CBC - BMP8+EGFR - Hepatic function panel  3. Gastroesophageal reflux disease, esophagitis presence not specified - POCT CBC  4. Vitamin D deficiency - Vit D  25 hydroxy (rtn osteoporosis monitoring)  5. Screening for osteoporosis - DG Bone Density; Future - Vit D  25 hydroxy (rtn osteoporosis monitoring)  6. Memory impairment -Change to Namzeric after one month  7. Abnormal chest CT -Continue to monitor this  No orders of the defined types were placed in this encounter.   Patient Instructions                       Medicare Annual Wellness Visit  [place] and the medical providers at [facility] strive to bring you the best medical care.  In doing so we not only want to address your current medical conditions and concerns but also to detect new conditions early and prevent illness, disease and health-related problems.    Medicare offers a yearly Wellness Visit which allows our clinical staff to assess your need for preventative services including immunizations, lifestyle education, counseling to decrease risk of preventable diseases and screening for fall risk and other medical concerns.    This visit is provided free of charge (no copay) for all Medicare recipients. The clinical pharmacists at [facility] have begun to conduct these Wellness Visits which will also include a thorough review of all your medications.    As you primary medical provider recommend that you make an appointment for your Annual Wellness Visit if you have not done so already this year.  You may set up this appointment before you leave today or you may call  back [phone]) and schedule an appointment.  Please make sure when you call that you mention that you are scheduling your Annual Wellness Visit with the clinical pharmacist so that the appointment may be made for the proper length of time.      Continue current medications. Continue good therapeutic lifestyle changes which include good diet and exercise. Fall precautions discussed with patient. If an FOBT was given today- please return it to our front desk. If you are over 29 years old - you may need Prevnar 13 or the adult Pneumonia vaccine.  Flu Shots will be available at our office starting mid- September. Please call and schedule a FLU CLINIC APPOINTMENT.   Continue physical therapy We will schedule you for a pelvic exam We will also schedule you for a bone density test We will call  you with your lab work results of same as those results are available Return  the FOBT Start Flonase one spray each nostril at bedtime Change to Namenda XR, and after one month switch to Namzeric and discontinue Aricept--- this was explained to her care giver   Arrie Senate MD

## 2014-01-07 NOTE — Patient Instructions (Addendum)
Medicare Annual Wellness Visit  [place] and the medical providers at [facility] strive to bring you the best medical care.  In doing so we not only want to address your current medical conditions and concerns but also to detect new conditions early and prevent illness, disease and health-related problems.    Medicare offers a yearly Wellness Visit which allows our clinical staff to assess your need for preventative services including immunizations, lifestyle education, counseling to decrease risk of preventable diseases and screening for fall risk and other medical concerns.    This visit is provided free of charge (no copay) for all Medicare recipients. The clinical pharmacists at [facility] have begun to conduct these Wellness Visits which will also include a thorough review of all your medications.    As you primary medical provider recommend that you make an appointment for your Annual Wellness Visit if you have not done so already this year.  You may set up this appointment before you leave today or you may call back [phone]) and schedule an appointment.  Please make sure when you call that you mention that you are scheduling your Annual Wellness Visit with the clinical pharmacist so that the appointment may be made for the proper length of time.      Continue current medications. Continue good therapeutic lifestyle changes which include good diet and exercise. Fall precautions discussed with patient. If an FOBT was given today- please return it to our front desk. If you are over 58 years old - you may need Prevnar 13 or the adult Pneumonia vaccine.  Flu Shots will be available at our office starting mid- September. Please call and schedule a FLU CLINIC APPOINTMENT.   Continue physical therapy We will schedule you for a pelvic exam We will also schedule you for a bone density test We will call you with your lab  work results of same as those results are available Return  the FOBT Start Flonase one spray each nostril at bedtime Change to Namenda XR, and after one month switch to Namzeric and discontinue Aricept--- this was explained to her care giver

## 2014-01-08 ENCOUNTER — Telehealth: Payer: Self-pay | Admitting: Family Medicine

## 2014-01-08 NOTE — Telephone Encounter (Signed)
Spoke with megan and CT results are in computer

## 2014-01-08 NOTE — Telephone Encounter (Signed)
Spoke with [name] in regards to patient and will have them bring urine by.

## 2014-01-08 NOTE — Telephone Encounter (Signed)
Make sure that we got a B12 level, if not add this to her blood work

## 2014-01-08 NOTE — Telephone Encounter (Signed)
Also make sure that she is taking a good multivitamin over-the-counter We should also make sure that a urinalysis was done

## 2014-01-09 ENCOUNTER — Ambulatory Visit: Payer: Medicare Other | Admitting: Physical Therapy

## 2014-01-09 ENCOUNTER — Other Ambulatory Visit (INDEPENDENT_AMBULATORY_CARE_PROVIDER_SITE_OTHER): Payer: Medicare Other

## 2014-01-09 DIAGNOSIS — IMO0001 Reserved for inherently not codable concepts without codable children: Secondary | ICD-10-CM | POA: Diagnosis not present

## 2014-01-09 DIAGNOSIS — N390 Urinary tract infection, site not specified: Secondary | ICD-10-CM

## 2014-01-09 DIAGNOSIS — R5383 Other fatigue: Secondary | ICD-10-CM

## 2014-01-09 DIAGNOSIS — R5381 Other malaise: Secondary | ICD-10-CM

## 2014-01-09 LAB — VITAMIN D 25 HYDROXY (VIT D DEFICIENCY, FRACTURES): VIT D 25 HYDROXY: 31.7 ng/mL (ref 30.0–100.0)

## 2014-01-09 LAB — POCT URINALYSIS DIPSTICK
Bilirubin, UA: NEGATIVE
Glucose, UA: NEGATIVE
Ketones, UA: NEGATIVE
Nitrite, UA: NEGATIVE
Spec Grav, UA: 1.025
UROBILINOGEN UA: NEGATIVE
pH, UA: 5

## 2014-01-09 LAB — HEPATIC FUNCTION PANEL
ALBUMIN: 4.5 g/dL (ref 3.5–4.7)
ALK PHOS: 61 IU/L (ref 39–117)
ALT: 12 IU/L (ref 0–32)
AST: 16 IU/L (ref 0–40)
BILIRUBIN DIRECT: 0.1 mg/dL (ref 0.00–0.40)
BILIRUBIN TOTAL: 0.4 mg/dL (ref 0.0–1.2)
Total Protein: 6.7 g/dL (ref 6.0–8.5)

## 2014-01-09 LAB — BMP8+EGFR
BUN/Creatinine Ratio: 25 (ref 11–26)
BUN: 17 mg/dL (ref 8–27)
CALCIUM: 9.9 mg/dL (ref 8.7–10.3)
CO2: 26 mmol/L (ref 18–29)
CREATININE: 0.69 mg/dL (ref 0.57–1.00)
Chloride: 90 mmol/L — ABNORMAL LOW (ref 97–108)
GFR, EST AFRICAN AMERICAN: 91 mL/min/{1.73_m2} (ref >59)
GFR, EST NON AFRICAN AMERICAN: 79 mL/min/{1.73_m2} (ref >59)
GLUCOSE: 97 mg/dL (ref 65–99)
POTASSIUM: 5.1 mmol/L (ref 3.5–5.2)
Sodium: 130 mmol/L — ABNORMAL LOW (ref 134–144)

## 2014-01-09 LAB — POCT UA - MICROSCOPIC ONLY
CRYSTALS, UR, HPF, POC: NEGATIVE
Casts, Ur, LPF, POC: NEGATIVE
Yeast, UA: NEGATIVE

## 2014-01-09 LAB — LIPID PANEL
CHOL/HDL RATIO: 3.5 ratio (ref 0.0–4.4)
Cholesterol, Total: 211 mg/dL — ABNORMAL HIGH (ref 100–199)
HDL: 60 mg/dL (ref >39)
LDL Calculated: 115 mg/dL — ABNORMAL HIGH (ref 0–99)
Triglycerides: 180 mg/dL — ABNORMAL HIGH (ref 0–149)
VLDL Cholesterol Cal: 36 mg/dL (ref 5–40)

## 2014-01-09 NOTE — Progress Notes (Signed)
Lab only 

## 2014-01-10 ENCOUNTER — Telehealth: Payer: Self-pay | Admitting: Family Medicine

## 2014-01-10 NOTE — Telephone Encounter (Signed)
Message copied by Azalee Course on Thu Jan 10, 2014 10:44 AM ------      Message from: Ernestina Penna      Created: Thu Jan 10, 2014  7:13 AM       Please call the patient's niece, Bonita Quin with these results      The blood sugar was good at 97. The creatinine, the most important kidney function test was within normal limits. The electrolytes were good except the sodium and chloride were decreased ----should recheck the BMP again in 4 weeks. She does not have to be fasting.      All liver function tests are within normal      Cholesterol numbers with advanced lipid testing had an elevated LDL C. at 1:15. The HDL is excellent. That is the good cholesterol. Triglycerides are also elevated.----- taking statin drugs. Please reemphasize to family members to help patient follow  diet as closely as possible and get as much exercise as possible      The vitamin D is at the lobe in the normal range-----make sure that the patient is taking vitamin D3, 1000, daily      Also make sure that the patient got started on the antibiotic for her urinary tract infection ------

## 2014-01-10 NOTE — Telephone Encounter (Signed)
Message copied by Azalee Course on Thu Jan 10, 2014 10:44 AM ------      Message from: Ernestina Penna      Created: Wed Jan 09, 2014  2:51 PM       The urinalysis has 15-20 WBC and 1-5 red blood cells . There are many bacteria----lab please do a urine culture and sensitivity.      Please call the patient's niece, Bonita Quin with these results      Please start Cipro 500 twice daily for 7 days and tell the patient's niece that we will call her with the results of the culture and sensitivity and change the antibiotic if necessary             ------

## 2014-01-15 ENCOUNTER — Telehealth: Payer: Self-pay | Admitting: Family Medicine

## 2014-01-15 ENCOUNTER — Ambulatory Visit: Payer: Medicare Other | Admitting: *Deleted

## 2014-01-15 DIAGNOSIS — IMO0001 Reserved for inherently not codable concepts without codable children: Secondary | ICD-10-CM | POA: Diagnosis not present

## 2014-01-15 NOTE — Telephone Encounter (Signed)
Pharm called and linda aware

## 2014-01-16 ENCOUNTER — Ambulatory Visit: Payer: Medicare Other | Admitting: Physical Therapy

## 2014-01-16 DIAGNOSIS — IMO0001 Reserved for inherently not codable concepts without codable children: Secondary | ICD-10-CM | POA: Diagnosis not present

## 2014-01-21 NOTE — Addendum Note (Signed)
Addended by: Orma Render F on: 01/21/2014 05:54 PM   Modules accepted: Orders

## 2014-01-22 ENCOUNTER — Ambulatory Visit: Payer: Medicare Other | Admitting: Physical Therapy

## 2014-01-22 DIAGNOSIS — IMO0001 Reserved for inherently not codable concepts without codable children: Secondary | ICD-10-CM | POA: Diagnosis not present

## 2014-01-23 ENCOUNTER — Encounter: Payer: 59 | Admitting: Physical Therapy

## 2014-01-24 ENCOUNTER — Ambulatory Visit: Payer: Medicare Other | Attending: Orthopedic Surgery | Admitting: Physical Therapy

## 2014-01-24 DIAGNOSIS — R5381 Other malaise: Secondary | ICD-10-CM | POA: Insufficient documentation

## 2014-01-24 DIAGNOSIS — Z5189 Encounter for other specified aftercare: Secondary | ICD-10-CM | POA: Insufficient documentation

## 2014-01-24 DIAGNOSIS — M25612 Stiffness of left shoulder, not elsewhere classified: Secondary | ICD-10-CM | POA: Insufficient documentation

## 2014-01-24 DIAGNOSIS — M25512 Pain in left shoulder: Secondary | ICD-10-CM | POA: Insufficient documentation

## 2014-01-28 ENCOUNTER — Ambulatory Visit: Payer: Medicare Other | Admitting: Physical Therapy

## 2014-01-28 DIAGNOSIS — Z5189 Encounter for other specified aftercare: Secondary | ICD-10-CM | POA: Diagnosis not present

## 2014-01-29 ENCOUNTER — Ambulatory Visit: Payer: Medicare Other | Admitting: Physical Therapy

## 2014-01-29 DIAGNOSIS — Z5189 Encounter for other specified aftercare: Secondary | ICD-10-CM | POA: Diagnosis not present

## 2014-02-04 ENCOUNTER — Ambulatory Visit: Payer: Medicare Other | Admitting: Physical Therapy

## 2014-02-04 DIAGNOSIS — Z5189 Encounter for other specified aftercare: Secondary | ICD-10-CM | POA: Diagnosis not present

## 2014-02-06 ENCOUNTER — Ambulatory Visit: Payer: Medicare Other | Admitting: Physical Therapy

## 2014-02-06 DIAGNOSIS — Z5189 Encounter for other specified aftercare: Secondary | ICD-10-CM | POA: Diagnosis not present

## 2014-02-11 ENCOUNTER — Other Ambulatory Visit: Payer: Medicare Other | Admitting: Nurse Practitioner

## 2014-02-13 ENCOUNTER — Ambulatory Visit: Payer: Medicare Other | Admitting: Physical Therapy

## 2014-02-13 DIAGNOSIS — Z5189 Encounter for other specified aftercare: Secondary | ICD-10-CM | POA: Diagnosis not present

## 2014-02-14 ENCOUNTER — Ambulatory Visit: Payer: Medicare Other | Admitting: Physical Therapy

## 2014-02-14 DIAGNOSIS — Z5189 Encounter for other specified aftercare: Secondary | ICD-10-CM | POA: Diagnosis not present

## 2014-02-15 ENCOUNTER — Telehealth: Payer: Self-pay | Admitting: Family Medicine

## 2014-02-18 ENCOUNTER — Encounter: Payer: 59 | Admitting: Physical Therapy

## 2014-02-20 ENCOUNTER — Encounter: Payer: 59 | Admitting: Physical Therapy

## 2014-02-25 ENCOUNTER — Encounter: Payer: 59 | Admitting: Physical Therapy

## 2014-02-25 ENCOUNTER — Other Ambulatory Visit: Payer: Medicare Other | Admitting: Nurse Practitioner

## 2014-02-26 ENCOUNTER — Encounter: Payer: 59 | Admitting: Physical Therapy

## 2014-03-27 ENCOUNTER — Other Ambulatory Visit: Payer: Medicare Other

## 2014-03-27 ENCOUNTER — Ambulatory Visit: Payer: Medicare Other

## 2014-04-03 ENCOUNTER — Telehealth: Payer: Self-pay | Admitting: Family Medicine

## 2014-04-03 NOTE — Telephone Encounter (Signed)
Will schedule

## 2014-04-04 ENCOUNTER — Encounter: Payer: Self-pay | Admitting: *Deleted

## 2014-04-04 NOTE — Telephone Encounter (Signed)
This encounter was created in error - please disregard.

## 2014-04-05 ENCOUNTER — Other Ambulatory Visit: Payer: Self-pay | Admitting: *Deleted

## 2014-04-05 ENCOUNTER — Telehealth: Payer: Self-pay | Admitting: Family Medicine

## 2014-04-05 MED ORDER — LOSARTAN POTASSIUM 100 MG PO TABS: 100.0000 mg | ORAL_TABLET | Freq: Every day | ORAL | Status: DC

## 2014-04-05 MED ORDER — RIVAROXABAN 15 MG PO TABS: 15.0000 mg | ORAL_TABLET | Freq: Every day | ORAL | Status: DC

## 2014-04-05 NOTE — Telephone Encounter (Signed)
Apt made

## 2014-04-10 ENCOUNTER — Other Ambulatory Visit: Payer: Medicare Other

## 2014-04-10 ENCOUNTER — Ambulatory Visit: Payer: Medicare Other

## 2014-04-15 ENCOUNTER — Ambulatory Visit (INDEPENDENT_AMBULATORY_CARE_PROVIDER_SITE_OTHER): Payer: Medicare Other | Admitting: *Deleted

## 2014-04-15 ENCOUNTER — Encounter: Payer: Self-pay | Admitting: Family Medicine

## 2014-04-15 ENCOUNTER — Ambulatory Visit (INDEPENDENT_AMBULATORY_CARE_PROVIDER_SITE_OTHER): Payer: Medicare Other | Admitting: Family Medicine

## 2014-04-15 VITALS — BP 140/72 | HR 68 | Temp 97.3°F | Ht 65.5 in | Wt 138.0 lb

## 2014-04-15 DIAGNOSIS — R609 Edema, unspecified: Secondary | ICD-10-CM | POA: Insufficient documentation

## 2014-04-15 DIAGNOSIS — I10 Essential (primary) hypertension: Secondary | ICD-10-CM

## 2014-04-15 DIAGNOSIS — I480 Paroxysmal atrial fibrillation: Secondary | ICD-10-CM

## 2014-04-15 DIAGNOSIS — F0390 Unspecified dementia without behavioral disturbance: Secondary | ICD-10-CM

## 2014-04-15 DIAGNOSIS — I4891 Unspecified atrial fibrillation: Secondary | ICD-10-CM | POA: Insufficient documentation

## 2014-04-15 DIAGNOSIS — R413 Other amnesia: Secondary | ICD-10-CM

## 2014-04-15 DIAGNOSIS — Z8679 Personal history of other diseases of the circulatory system: Secondary | ICD-10-CM

## 2014-04-15 DIAGNOSIS — Z23 Encounter for immunization: Secondary | ICD-10-CM

## 2014-04-15 DIAGNOSIS — J439 Emphysema, unspecified: Secondary | ICD-10-CM

## 2014-04-15 DIAGNOSIS — I2510 Atherosclerotic heart disease of native coronary artery without angina pectoris: Secondary | ICD-10-CM

## 2014-04-15 DIAGNOSIS — E785 Hyperlipidemia, unspecified: Secondary | ICD-10-CM

## 2014-04-15 LAB — POCT CBC
GRANULOCYTE PERCENT: 70.6 % (ref 37–80)
HEMATOCRIT: 31.4 % — AB (ref 37.7–47.9)
HEMOGLOBIN: 9.6 g/dL — AB (ref 12.2–16.2)
Lymph, poc: 1.8 (ref 0.6–3.4)
MCH, POC: 27.2 pg (ref 27–31.2)
MCHC: 30.7 g/dL — AB (ref 31.8–35.4)
MCV: 88.7 fL (ref 80–97)
MPV: 8 fL (ref 0–99.8)
POC GRANULOCYTE: 5.4 (ref 2–6.9)
POC LYMPH PERCENT: 22.9 %L (ref 10–50)
Platelet Count, POC: 295 10*3/uL (ref 142–424)
RBC: 3.5 M/uL — AB (ref 4.04–5.48)
RDW, POC: 14.8 %
WBC: 7.7 10*3/uL (ref 4.6–10.2)

## 2014-04-15 MED ORDER — BUSPIRONE HCL 5 MG PO TABS: 5.0000 mg | ORAL_TABLET | Freq: Every day | ORAL | Status: DC

## 2014-04-15 NOTE — Progress Notes (Signed)
Subjective:    Patient ID: [name], female    DOB: Feb 18, 1925, 80 y.o.   MRN: [id number]  HPI  Patient here today for hospital and surgery follow up. She is accompanied today by Jinny Blossom, her grand niece. The history is somewhat complicated. In summary the patient had surgery on her left rotator and had a repair in June she went to the nursing home after this and went home in July. She saw the allergy doctor in July and got prednisone injections and inhalers. In October she went on a trip to [place] and on the way back from [place] she developed increasing shortness of breath and ended up in the hospital with a diagnosis of congestive heart failure and atrial fibrillation. From there she went back to the nursing home and has since come back home as of about 3 weeks ago and continues to be followed by the cardiologist. She was recently seen by him and her Lasix was increased to 40 mg every other day and 20 on alternate days. She has plans to see the cardiologist again on January 4. She responds appropriately to questions today and does appear somewhat pale in color. She has a daytime caregiver that stays by herself at nighttime and she has been doing this for 3 weeks. She has increased anxiety at night and the family request something for this that is very mild. She also needs an order for pulse ox machine and for a handicap toilet. She is not eating well and not sleeping well because of her anxiety at night. She remains fatigued and short of breath.        Patient Active Problem List   Diagnosis Date Noted  . Nodule of right lung 05/27/2013  . Memory impairment 03/12/2013  . Prophylactic immunotherapy 10/24/2012  . Dyspnea 07/06/2012  . Extrinsic asthma 08/31/2010  . Atrophic vaginitis   . Malaise and fatigue   . Thoracic scoliosis   . Insomnia, idiopathic   . Essential hypertension, benign   . Gastritis   . Hiatal hernia   . Colon polyps   . Diverticulosis   . Osteoarthrosis  and allied disorders   . Abdominal pain   . DYSLIPIDEMIA 02/11/2009  . HYPERTENSION 02/10/2009  . GERD 02/10/2009   Outpatient Encounter Prescriptions as of 04/15/2014  Medication Sig  . albuterol (PROAIR HFA) 108 (90 BASE) MCG/ACT inhaler Inhale 2 puffs into the lungs every 6 (six) hours as needed.  Marland Kitchen amiodarone (PACERONE) 200 MG tablet Take 200 mg by mouth daily.   Marland Kitchen donepezil (ARICEPT) 10 MG tablet Take 10 mg by mouth at bedtime.  . furosemide (LASIX) 20 MG tablet Take 40 mg by mouth.  . losartan (COZAAR) 100 MG tablet Take 1 tablet (100 mg total) by mouth daily.  . memantine (NAMENDA) 10 MG tablet Take 1 tablet (10 mg total) by mouth 2 (two) times daily.  . metoprolol (LOPRESSOR) 50 MG tablet TAKE  (1)  TABLET TWICE A DAY.  Marland Kitchen omeprazole (PRILOSEC) 20 MG capsule TAKE (1) CAPSULE TWICE DAILY.  Marland Kitchen Potassium Chloride ER 20 MEQ TBCR Take 2 tablets by mouth.  . Rivaroxaban (XARELTO) 15 MG TABS tablet Take 1 tablet (15 mg total) by mouth daily with supper.  . SYMBICORT 80-4.5 MCG/ACT inhaler Inhale 2 puffs into the lungs 2 (two) times daily.  . [DISCONTINUED] budesonide-formoterol (SYMBICORT) 80-4.5 MCG/ACT inhaler Inhale 2 puffs into the lungs.  . fluticasone (FLONASE) 50 MCG/ACT nasal spray 2 sprays by Nasal route daily.    . [  DISCONTINUED] amLODipine (NORVASC) 10 MG tablet TAKE 1 TABLET DAILY     Review of Systems  Constitutional: Positive for appetite change (not hungry most of the time) and fatigue.       Not sleeping well   HENT: Negative.   Eyes: Negative.   Respiratory: Positive for shortness of breath.   Cardiovascular: Negative.   Gastrointestinal: Negative.   Endocrine: Negative.   Genitourinary: Negative.   Musculoskeletal: Negative.   Skin: Negative.   Allergic/Immunologic: Negative.   Neurological: Negative.   Hematological: Negative.   Psychiatric/Behavioral: The patient is nervous/anxious (anxious/ nervous at night ).        Objective:   Physical Exam    Constitutional: She is oriented to person, place, and time. No distress.  Elderly, and somewhat frail-looking but responds appropriately to questions asked of her  HENT:  Head: Normocephalic and atraumatic.  Right Ear: External ear normal.  Left Ear: External ear normal.  Nose: Nose normal.  Mouth/Throat: Oropharynx is clear and moist. No oropharyngeal exudate.  Eyes: Conjunctivae and EOM are normal. Pupils are equal, round, and reactive to light. Right eye exhibits no discharge. Left eye exhibits no discharge. No scleral icterus.  Neck: Normal range of motion. Neck supple. No thyromegaly present.  Cardiovascular: Normal rate, regular rhythm and normal heart sounds.   No murmur heard. The heart rhythm is regular today at 60/m with only an occasional irregularity.  Pulmonary/Chest: Effort normal and breath sounds normal. No respiratory distress. She has no wheezes. She has no rales. She exhibits no tenderness.  The lungs were clear today anteriorly and posteriorly there was no rhonchi or rales or wheezes heard. The patient did seem to have a dry cough that was nonproductive.  Abdominal: Soft. Bowel sounds are normal. She exhibits no mass. There is no tenderness. There is no rebound and no guarding.  Musculoskeletal: Normal range of motion. She exhibits edema. She exhibits no tenderness.  3+ edema on the left and 2+ edema on the right above the sock line  Lymphadenopathy:    She has no cervical adenopathy.  Neurological: She is alert and oriented to person, place, and time. No cranial nerve deficit.  Although the patient was alert today the niece says that she is very forgetful and has a very poor memory at home.  Skin: Skin is warm and dry. No rash noted.  Psychiatric: She has a normal mood and affect. Her behavior is normal. Judgment and thought content normal.  Nursing note and vitals reviewed.  BP 140/72 mmHg  Pulse 68  Temp(Src) 97.3 F (36.3 C) (Oral)  Ht 5' 5.5" (1.664 m)  Wt  138 lb (62.596 kg)  BMI 22.61 kg/m2        Assessment & Plan:  1. Paroxysmal atrial fibrillation - POCT CBC - BMP8+EGFR - DME Bedside commode  2. Edema - POCT CBC - BMP8+EGFR - DME Bedside commode  3. Dementia, without behavioral disturbance  4. Pulmonary emphysema, unspecified emphysema type  5. ASCVD (arteriosclerotic cardiovascular disease)  6. History of congestive heart failure  7. Essential hypertension  8. Memory disturbance  9. Hyperlipidemia  Meds ordered this encounter  Medications  . amiodarone (PACERONE) 200 MG tablet    Sig: Take 200 mg by mouth daily.   . furosemide (LASIX) 20 MG tablet    Sig: Take 40 mg by mouth.  . Potassium Chloride ER 20 MEQ TBCR    Sig: Take 2 tablets by mouth.  . DISCONTD: budesonide-formoterol (SYMBICORT) 80-4.5 MCG/ACT inhaler  Sig: Inhale 2 puffs into the lungs.  . busPIRone (BUSPAR) 5 MG tablet    Sig: Take 1 tablet (5 mg total) by mouth at bedtime.    Dispense:  30 tablet    Refill:  1   Patient Instructions  Continue to monitor patient closely Keep follow-up appointment with cardiology Get daily weights at the same time every day Elevate feet when possible Get support hose that are just below the knee and put them on the first thing in the morning with a rising Continue to make sure that she uses her walker at home and at the home is well lit to decrease the risk of falling   Arrie Senate MD

## 2014-04-15 NOTE — Addendum Note (Signed)
Addended by: Magdalene RiverBULLINS, Gimena Buick H on: 04/15/2014 05:14 PM   Modules accepted: Orders

## 2014-04-15 NOTE — Patient Instructions (Signed)
Continue to monitor patient closely Keep follow-up appointment with cardiology Get daily weights at the same time every day Elevate feet when possible Get support hose that are just below the knee and put them on the first thing in the morning with a rising Continue to make sure that she uses her walker at home and at the home is well lit to decrease the risk of falling

## 2014-04-16 ENCOUNTER — Other Ambulatory Visit: Payer: Medicare Other

## 2014-04-16 LAB — BMP8+EGFR
BUN/Creatinine Ratio: 20 (ref 11–26)
BUN: 33 mg/dL — AB (ref 8–27)
CALCIUM: 8.2 mg/dL — AB (ref 8.7–10.3)
CHLORIDE: 97 mmol/L (ref 97–108)
CO2: 24 mmol/L (ref 18–29)
Creatinine, Ser: 1.66 mg/dL — ABNORMAL HIGH (ref 0.57–1.00)
GFR calc Af Amer: 32 mL/min/{1.73_m2} — ABNORMAL LOW (ref >59)
GFR, EST NON AFRICAN AMERICAN: 28 mL/min/{1.73_m2} — AB (ref >59)
GLUCOSE: 85 mg/dL (ref 65–99)
POTASSIUM: 5.1 mmol/L (ref 3.5–5.2)
Sodium: 136 mmol/L (ref 134–144)

## 2014-04-16 NOTE — Progress Notes (Signed)
Lab only 

## 2014-04-17 ENCOUNTER — Telehealth: Payer: Self-pay | Admitting: Family Medicine

## 2014-04-17 NOTE — Telephone Encounter (Signed)
This is FYI - will follow up

## 2014-04-18 LAB — FECAL OCCULT BLOOD, IMMUNOCHEMICAL: FECAL OCCULT BLD: POSITIVE — AB

## 2014-04-29 ENCOUNTER — Telehealth: Payer: Self-pay | Admitting: Family Medicine

## 2014-04-29 NOTE — Telephone Encounter (Signed)
This is FYI

## 2014-04-29 NOTE — Telephone Encounter (Signed)
Cardioversion is going to be done on Wednesday, January 6.

## 2014-05-20 ENCOUNTER — Ambulatory Visit: Payer: Medicare Other | Admitting: Family Medicine

## 2014-05-27 ENCOUNTER — Ambulatory Visit: Payer: Medicare Other | Admitting: Family Medicine

## 2014-05-31 ENCOUNTER — Encounter: Payer: Self-pay | Admitting: Family Medicine

## 2014-05-31 ENCOUNTER — Ambulatory Visit (INDEPENDENT_AMBULATORY_CARE_PROVIDER_SITE_OTHER): Payer: Medicare Other | Admitting: Family Medicine

## 2014-05-31 VITALS — BP 118/64 | HR 69 | Temp 97.1°F | Ht 65.5 in | Wt 129.0 lb

## 2014-05-31 DIAGNOSIS — I480 Paroxysmal atrial fibrillation: Secondary | ICD-10-CM

## 2014-05-31 DIAGNOSIS — I2510 Atherosclerotic heart disease of native coronary artery without angina pectoris: Secondary | ICD-10-CM

## 2014-05-31 DIAGNOSIS — F0390 Unspecified dementia without behavioral disturbance: Secondary | ICD-10-CM

## 2014-05-31 DIAGNOSIS — K921 Melena: Secondary | ICD-10-CM

## 2014-05-31 DIAGNOSIS — H6123 Impacted cerumen, bilateral: Secondary | ICD-10-CM

## 2014-05-31 DIAGNOSIS — R710 Precipitous drop in hematocrit: Secondary | ICD-10-CM

## 2014-05-31 DIAGNOSIS — D649 Anemia, unspecified: Secondary | ICD-10-CM

## 2014-05-31 LAB — POCT CBC
Granulocyte percent: 71.7 %G (ref 37–80)
HCT, POC: 39.3 % (ref 37.7–47.9)
HEMOGLOBIN: 12 g/dL — AB (ref 12.2–16.2)
LYMPH, POC: 1.4 (ref 0.6–3.4)
MCH: 26 pg — AB (ref 27–31.2)
MCHC: 30.6 g/dL — AB (ref 31.8–35.4)
MCV: 85.1 fL (ref 80–97)
MPV: 7.9 fL (ref 0–99.8)
PLATELET COUNT, POC: 284 10*3/uL (ref 142–424)
POC Granulocyte: 5 (ref 2–6.9)
POC LYMPH PERCENT: 20.1 %L (ref 10–50)
RBC: 4.6 M/uL (ref 4.04–5.48)
RDW, POC: 16.6 %
WBC: 7 10*3/uL (ref 4.6–10.2)

## 2014-05-31 NOTE — Patient Instructions (Addendum)
Continue current medications. Continue good therapeutic lifestyle changes which include good diet and exercise. Fall precautions discussed with patient. If an FOBT was given today- please return it to our front desk. If you are over 79 years old - you may need Prevnar 13 or the adult Pneumonia vaccine.  Flu Shots are still available at our office. If you still haven't had one please call to set up a nurse visit to get one.   After your visit with us today you will receive a survey in the mail or online from [employer] regarding your care with us. Please take a moment to fill this out. Your feedback is very important to us as you can help us better understand your patient needs as well as improve your experience and satisfaction. WE CARE ABOUT YOU!!!   Continue to follow-up with cardiology Return FOBT Try to get out and walk more but always walked with someone being with you Exercise as much as possible Drink plenty of fluids

## 2014-05-31 NOTE — Progress Notes (Signed)
Subjective:    Patient ID: [name], female    DOB: Feb 17, 1925, 81 y.o.   MRN: [id number]  HPI Patient here today for 6 week follow up. She has recently had a cardioversion done and she is feeling much better since that. She also was started on Buspar qhs at the last office visit and she states that she now sleeps well. She is accompanied today by Jinny Blossom, family member. The niece indicates that she has had some agitation and confusion at home. She is taking her Aricept and Namenda and the niece prepares this medication for her regularly. The caregiver gives the medicine. The patient has been somewhat unwilling to go out and get more exercise and during this conversation she was encouraged to do this      Patient Active Problem List   Diagnosis Date Noted  . Atrial fibrillation 04/15/2014  . Edema 04/15/2014  . Nodule of right lung 05/27/2013  . Memory impairment 03/12/2013  . Prophylactic immunotherapy 10/24/2012  . Dyspnea 07/06/2012  . Extrinsic asthma 08/31/2010  . Atrophic vaginitis   . Malaise and fatigue   . Thoracic scoliosis   . Insomnia, idiopathic   . Essential hypertension, benign   . Gastritis   . Hiatal hernia   . Colon polyps   . Diverticulosis   . Osteoarthrosis and allied disorders   . Abdominal pain   . DYSLIPIDEMIA 02/11/2009  . HYPERTENSION 02/10/2009  . GERD 02/10/2009   Outpatient Encounter Prescriptions as of 05/31/2014  Medication Sig  . albuterol (PROAIR HFA) 108 (90 BASE) MCG/ACT inhaler Inhale 2 puffs into the lungs every 6 (six) hours as needed.  Marland Kitchen amiodarone (PACERONE) 200 MG tablet Take 200 mg by mouth daily.   . busPIRone (BUSPAR) 5 MG tablet Take 1 tablet (5 mg total) by mouth at bedtime.  . donepezil (ARICEPT) 10 MG tablet Take 10 mg by mouth at bedtime.  . fluticasone (FLONASE) 50 MCG/ACT nasal spray 2 sprays by Nasal route daily.    . furosemide (LASIX) 20 MG tablet Take 20 mg by mouth daily.  Marland Kitchen losartan (COZAAR) 100 MG tablet Take  1 tablet (100 mg total) by mouth daily.  . memantine (NAMENDA) 10 MG tablet Take 1 tablet (10 mg total) by mouth 2 (two) times daily.  . metoprolol succinate (TOPROL-XL) 25 MG 24 hr tablet Take 1 tablet by mouth daily.  Marland Kitchen omeprazole (PRILOSEC) 20 MG capsule TAKE (1) CAPSULE TWICE DAILY.  Marland Kitchen Potassium Chloride ER 20 MEQ TBCR Take 2 tablets by mouth.  . Rivaroxaban (XARELTO) 15 MG TABS tablet Take 1 tablet (15 mg total) by mouth daily with supper.  . SYMBICORT 80-4.5 MCG/ACT inhaler Inhale 2 puffs into the lungs 2 (two) times daily.  . [DISCONTINUED] furosemide (LASIX) 20 MG tablet Take 40 mg by mouth.  . [DISCONTINUED] metoprolol (LOPRESSOR) 50 MG tablet TAKE  (1)  TABLET TWICE A DAY.    Review of Systems  Constitutional: Negative.   HENT: Negative.   Eyes: Negative.   Respiratory: Negative.   Cardiovascular: Negative.   Gastrointestinal: Negative.   Endocrine: Negative.   Genitourinary: Negative.   Musculoskeletal: Negative.   Skin: Negative.   Allergic/Immunologic: Negative.   Neurological: Negative.   Hematological: Negative.   Psychiatric/Behavioral: Positive for confusion and agitation.       Objective:   Physical Exam  Constitutional: She is oriented to person, place, and time. She appears well-developed and well-nourished.  HENT:  Head: Normocephalic and atraumatic.  Nose: Nose normal.  Mouth/Throat: Oropharynx is clear and moist. No oropharyngeal exudate.  Well-hydrated. Ears cerumen bilaterally  Eyes: Conjunctivae and EOM are normal. Pupils are equal, round, and reactive to light. Right eye exhibits no discharge. Left eye exhibits no discharge. No scleral icterus.  Neck: Normal range of motion. Neck supple. No thyromegaly present.  No thyromegaly or carotid bruits  Cardiovascular: Normal rate, regular rhythm and normal heart sounds.  Exam reveals no gallop and no friction rub.   No murmur heard. The heart was regular today at 72/m  Pulmonary/Chest: Effort normal and  breath sounds normal. No respiratory distress. She has no wheezes. She has no rales. She exhibits no tenderness.  Lungs were clear anteriorly and posteriorly  Abdominal: Soft. Bowel sounds are normal. She exhibits no mass. There is no tenderness. There is no rebound and no guarding.  Musculoskeletal: Normal range of motion. She exhibits no edema.  Lymphadenopathy:    She has no cervical adenopathy.  Neurological: She is alert and oriented to person, place, and time. She has normal reflexes. No cranial nerve deficit.  Skin: Skin is warm and dry. No rash noted.  Psychiatric: She has a normal mood and affect. Her behavior is normal. Judgment and thought content normal.  Nursing note and vitals reviewed.  BP 118/64 mmHg  Pulse 69  Temp(Src) 97.1 F (36.2 C) (Oral)  Ht 5' 5.5" (1.664 m)  Wt 129 lb (58.514 kg)  BMI 21.13 kg/m2  Ear irrigation was done today bilaterally--- wax still remained and she will use earwax softener and come back for re-washed in the future      Assessment & Plan:  1. Dementia, without behavioral disturbance --Continue with support at home -Keep patient as active as possible -Drink plenty of fluids -Continue with Namenda and Aricept  2. Paroxysmal atrial fibrillation  -follow-up with cardiology -Continue with Xarelto - POCT CBC - BMP8+EGFR - Hepatic function panel  3. Decreased hemoglobin -Return FOBT - POCT CBC - BMP8+EGFR - Hepatic function panel  4. Blood in stool -Return FOBT  5. ASCVD (arteriosclerotic cardiovascular disease  follow-up with cardiology -Continue with blood thinner and with current heart medications   6. Impacted cerumen of both ears -Ear irrigation -We will have patient use Debrox to soften up the earwax and attempt to re-irrigate the ears and a couple weeks   Meds ordered this encounter  Medications  . metoprolol succinate (TOPROL-XL) 25 MG 24 hr tablet    Sig: Take 1 tablet by mouth daily.  . furosemide (LASIX) 20 MG  tablet    Sig: Take 20 mg by mouth daily.   Patient Instructions  Continue current medications. Continue good therapeutic lifestyle changes which include good diet and exercise. Fall precautions discussed with patient. If an FOBT was given today- please return it to our front desk. If you are over 46 years old - you may need Prevnar 58 or the adult Pneumonia vaccine.  Flu Shots are still available at our office. If you still haven't had one please call to set up a nurse visit to get one.   After your visit with [place] today you will receive a survey in the mail or online from [employer] regarding your care with [place]. Please take a moment to fill this out. Your feedback is very important to [place] as you can help [place] better understand your patient needs as well as improve your experience and satisfaction. WE CARE ABOUT YOU!!!   Continue to follow-up with cardiology Return FOBT Try to get  out and walk more but always walked with someone being with you Exercise as much as possible Drink plenty of fluids   Arrie Senate MD

## 2014-06-01 ENCOUNTER — Other Ambulatory Visit: Payer: Self-pay | Admitting: Family Medicine

## 2014-06-01 ENCOUNTER — Other Ambulatory Visit: Payer: Medicare Other

## 2014-06-01 ENCOUNTER — Telehealth: Payer: Self-pay | Admitting: *Deleted

## 2014-06-01 DIAGNOSIS — R948 Abnormal results of function studies of other organs and systems: Secondary | ICD-10-CM

## 2014-06-01 LAB — HEPATIC FUNCTION PANEL
ALBUMIN: 4.3 g/dL (ref 3.5–4.7)
ALT: 16 IU/L (ref 0–32)
AST: 18 IU/L (ref 0–40)
Alkaline Phosphatase: 70 IU/L (ref 39–117)
BILIRUBIN TOTAL: 0.3 mg/dL (ref 0.0–1.2)
Bilirubin, Direct: 0.11 mg/dL (ref 0.00–0.40)
Total Protein: 6.7 g/dL (ref 6.0–8.5)

## 2014-06-01 LAB — BMP8+EGFR
BUN / CREAT RATIO: 25 (ref 11–26)
BUN: 29 mg/dL — ABNORMAL HIGH (ref 8–27)
CO2: 22 mmol/L (ref 18–29)
Calcium: 9.9 mg/dL (ref 8.7–10.3)
Chloride: 93 mmol/L — ABNORMAL LOW (ref 97–108)
Creatinine, Ser: 1.17 mg/dL — ABNORMAL HIGH (ref 0.57–1.00)
GFR calc Af Amer: 48 mL/min/{1.73_m2} — ABNORMAL LOW (ref >59)
GFR, EST NON AFRICAN AMERICAN: 42 mL/min/{1.73_m2} — AB (ref >59)
Glucose: 84 mg/dL (ref 65–99)
Potassium: 6.1 mmol/L (ref 3.5–5.2)
SODIUM: 130 mmol/L — AB (ref 134–144)

## 2014-06-01 NOTE — Telephone Encounter (Signed)
[facility] called with critical lab K+  6.1 Called Linda(pt's niece) to inform pt needs repeat K+ Verbalizes understanding

## 2014-06-02 ENCOUNTER — Other Ambulatory Visit: Payer: Self-pay | Admitting: Family Medicine

## 2014-06-02 LAB — BMP8+EGFR
BUN/Creatinine Ratio: 21 (ref 11–26)
BUN: 27 mg/dL (ref 8–27)
CO2: 22 mmol/L (ref 18–29)
CREATININE: 1.3 mg/dL — AB (ref 0.57–1.00)
Calcium: 9.8 mg/dL (ref 8.7–10.3)
Chloride: 93 mmol/L — ABNORMAL LOW (ref 97–108)
GFR calc non Af Amer: 37 mL/min/{1.73_m2} — ABNORMAL LOW (ref >59)
GFR, EST AFRICAN AMERICAN: 43 mL/min/{1.73_m2} — AB (ref >59)
GLUCOSE: 62 mg/dL — AB (ref 65–99)
Potassium: 6.2 mmol/L (ref 3.5–5.2)
Sodium: 134 mmol/L (ref 134–144)

## 2014-06-03 ENCOUNTER — Telehealth: Payer: Self-pay | Admitting: Family Medicine

## 2014-06-03 DIAGNOSIS — E875 Hyperkalemia: Secondary | ICD-10-CM

## 2014-06-03 NOTE — Telephone Encounter (Signed)
Advised that potassium was still elevated on last blood draw. She has discontinued potassium supplement for now and will come back in on Wednesday for repeat draw.

## 2014-06-05 ENCOUNTER — Other Ambulatory Visit (INDEPENDENT_AMBULATORY_CARE_PROVIDER_SITE_OTHER): Payer: Medicare Other

## 2014-06-05 DIAGNOSIS — E875 Hyperkalemia: Secondary | ICD-10-CM

## 2014-06-05 NOTE — Progress Notes (Signed)
Lab only 

## 2014-06-06 ENCOUNTER — Other Ambulatory Visit: Payer: Self-pay | Admitting: Family Medicine

## 2014-06-06 LAB — POTASSIUM: Potassium: 4.7 mmol/L (ref 3.5–5.2)

## 2014-06-06 NOTE — Telephone Encounter (Signed)
Last seen 05/31/14 [facility]  Benicar not on EPIC med list

## 2014-06-12 ENCOUNTER — Other Ambulatory Visit: Payer: Self-pay | Admitting: Family Medicine

## 2014-06-12 ENCOUNTER — Telehealth: Payer: Self-pay | Admitting: Family Medicine

## 2014-06-12 NOTE — Telephone Encounter (Signed)
Spoke with niece this morning and she states that [name] has been taking Benicar and not losartan. When she went to pick up meds she was given losartan. She is unaware of any change to meds and wanted to make sure this is correct. If it has been changed she wanted you to know that she sees the cardiologist on 06-20-14 and they were to discuss changing meds then. Please advise

## 2014-06-12 NOTE — Telephone Encounter (Signed)
To my knowledge this change was made because of the cost of Benicar and her insurance is not wanting to pay for it please check the notes from previous communications. Once this is done please call the patient's caregiver Aundra MilletMegan and let her know why this change was made.

## 2014-06-13 ENCOUNTER — Other Ambulatory Visit: Payer: Self-pay | Admitting: *Deleted

## 2014-06-13 MED ORDER — LOSARTAN POTASSIUM 100 MG PO TABS: 100.0000 mg | ORAL_TABLET | Freq: Every day | ORAL | Status: DC

## 2014-06-13 NOTE — Telephone Encounter (Signed)
Niece notified that rx was changed due to insurance not wanting to cover. [facility]

## 2014-06-28 ENCOUNTER — Other Ambulatory Visit: Payer: Self-pay | Admitting: Family Medicine

## 2014-07-08 ENCOUNTER — Ambulatory Visit: Payer: Medicare Other | Admitting: Family Medicine

## 2014-07-16 ENCOUNTER — Ambulatory Visit: Payer: Medicare Other | Admitting: Family Medicine

## 2014-08-01 ENCOUNTER — Other Ambulatory Visit: Payer: Self-pay | Admitting: Family Medicine

## 2014-08-06 ENCOUNTER — Telehealth: Payer: Self-pay | Admitting: Internal Medicine

## 2014-08-06 NOTE — Telephone Encounter (Signed)
Sabrina MilletMegan was calling to let us know that she canceled her CT. She feels it is not needed. Nothing further needed at this time.

## 2014-08-06 NOTE — Telephone Encounter (Signed)
Left message for Aundra MilletMegan to return call.  Ok for front desk to let Aundra MilletMegan know that Patient is [facility]

## 2014-08-26 ENCOUNTER — Encounter: Payer: Self-pay | Admitting: Family Medicine

## 2014-08-26 ENCOUNTER — Telehealth: Payer: Self-pay | Admitting: Family Medicine

## 2014-08-26 ENCOUNTER — Ambulatory Visit (INDEPENDENT_AMBULATORY_CARE_PROVIDER_SITE_OTHER): Payer: Medicare Other | Admitting: Family Medicine

## 2014-08-26 VITALS — BP 114/58 | HR 88 | Temp 100.5°F | Ht 65.5 in | Wt 137.0 lb

## 2014-08-26 DIAGNOSIS — R531 Weakness: Secondary | ICD-10-CM | POA: Diagnosis not present

## 2014-08-26 DIAGNOSIS — I10 Essential (primary) hypertension: Secondary | ICD-10-CM | POA: Diagnosis not present

## 2014-08-26 DIAGNOSIS — I950 Idiopathic hypotension: Secondary | ICD-10-CM | POA: Diagnosis not present

## 2014-08-26 DIAGNOSIS — R710 Precipitous drop in hematocrit: Secondary | ICD-10-CM

## 2014-08-26 DIAGNOSIS — D649 Anemia, unspecified: Secondary | ICD-10-CM | POA: Diagnosis not present

## 2014-08-26 DIAGNOSIS — S7012XA Contusion of left thigh, initial encounter: Secondary | ICD-10-CM

## 2014-08-26 DIAGNOSIS — I480 Paroxysmal atrial fibrillation: Secondary | ICD-10-CM

## 2014-08-26 LAB — POCT CBC
GRANULOCYTE PERCENT: 77.4 % (ref 37–80)
HCT, POC: 23.8 % — AB (ref 37.7–47.9)
Hemoglobin: 7.5 g/dL — AB (ref 12.2–16.2)
Lymph, poc: 1.6 (ref 0.6–3.4)
MCH, POC: 28.1 pg (ref 27–31.2)
MCHC: 31.6 g/dL — AB (ref 31.8–35.4)
MCV: 89.1 fL (ref 80–97)
MPV: 8.3 fL (ref 0–99.8)
PLATELET COUNT, POC: 243 10*3/uL (ref 142–424)
POC Granulocyte: 8.8 — AB (ref 2–6.9)
POC LYMPH PERCENT: 13.9 %L (ref 10–50)
RBC: 2.67 M/uL — AB (ref 4.04–5.48)
RDW, POC: 15 %
WBC: 11.4 10*3/uL — AB (ref 4.6–10.2)

## 2014-08-26 NOTE — Patient Instructions (Signed)
Reduce losartan to one half of 100 mg  Monitor blood pressures at home and bring readings by for review and a couple weeks If the blood pressure increases back to the 140-150 range for the top number increase the losartan back to 100 mg

## 2014-08-26 NOTE — Telephone Encounter (Signed)
appt made

## 2014-08-26 NOTE — Progress Notes (Signed)
Subjective:    Patient ID: [name], female    DOB: Feb 27, 1925, 81 y.o.   MRN: [id number]  HPI Patient here today for bruising and pain on the left thigh. She is accompanied today by Vaughan Basta. According to Vaughan Basta her niece she seems to have gained some fluid also. She is followed regularly by the cardiologist at [facility], Dr. Hamilton Capri. She stays at home and has a memory disorder. She is on Xarelto, and antiarrhythmic and medication for her memory.      Patient Active Problem List   Diagnosis Date Noted  . Atrial fibrillation 04/15/2014  . Edema 04/15/2014  . Nodule of right lung 05/27/2013  . Memory impairment 03/12/2013  . Prophylactic immunotherapy 10/24/2012  . Dyspnea 07/06/2012  . Extrinsic asthma 08/31/2010  . Atrophic vaginitis   . Malaise and fatigue   . Thoracic scoliosis   . Insomnia, idiopathic   . Essential hypertension, benign   . Gastritis   . Hiatal hernia   . Colon polyps   . Diverticulosis   . Osteoarthrosis and allied disorders   . Abdominal pain   . DYSLIPIDEMIA 02/11/2009  . HYPERTENSION 02/10/2009  . GERD 02/10/2009   Outpatient Encounter Prescriptions as of 08/26/2014  . Order #: [id number] Class: Normal  . Order #: [id number] Class: Historical Med  . Order #: [id number] Class: Normal  . Order #: [id number] Class: Normal  . Order #: [id number] Class: Historical Med  . Order #: [id number] Class: Normal  . Order #: [id number] Class: Phone In  . Order #: [id number] Class: Historical Med  . Order #: [id number] Class: Normal  . Order #: [id number] Class: Normal  . Order #: [id number] Class: Historical Med  . [DISCONTINUED] Order #: [id number] Class: Historical Med  . [DISCONTINUED] Order #: [id number] Class: Historical Med      Review of Systems  Constitutional: Negative.   HENT: Negative.   Eyes: Negative.   Respiratory: Negative.   Cardiovascular: Negative.   Gastrointestinal: Negative.   Endocrine: Negative.   Genitourinary: Negative.     Musculoskeletal: Positive for myalgias.  Skin: Negative.  Rash: bruising and pain - left thigh.  Allergic/Immunologic: Negative.   Neurological: Negative.   Hematological: Negative.   Psychiatric/Behavioral: Negative.        Objective:   Physical Exam  Constitutional: She is oriented to person, place, and time. She appears well-developed and well-nourished. No distress.  The patient is elderly but alert and looking better than she has looked in the past few visits that I have seen her.  HENT:  Head: Normocephalic and atraumatic.  Eyes: Conjunctivae and EOM are normal. Pupils are equal, round, and reactive to light. Right eye exhibits no discharge. Left eye exhibits no discharge. No scleral icterus.  Neck: Normal range of motion. Neck supple. No thyromegaly present.  Cardiovascular: Normal rate and regular rhythm.  Exam reveals no gallop.   No murmur heard. The heart was regular today at 72/m  Pulmonary/Chest: Effort normal and breath sounds normal. No respiratory distress. She has no wheezes. She has no rales. She exhibits no tenderness.  Abdominal: Soft. Bowel sounds are normal. She exhibits no mass. There is tenderness. There is no rebound and no guarding.  Slight left lower quadrant tenderness  Musculoskeletal: She exhibits no edema or tenderness.  The patient has good range of motion with her walker.  Lymphadenopathy:    She has no cervical adenopathy.  Neurological: She is alert and oriented to person, place, and time.  Skin: Skin is warm and dry. No rash noted.  There is a contusion of the left inner thigh which appears to be resolving  Psychiatric: She has a normal mood and affect. Her behavior is normal. Judgment and thought content normal.  Patient was alert and responded appropriately to questions today  Nursing note and vitals reviewed.  BP 114/58 mmHg  Pulse 88  Temp(Src) 100.5 F (38.1 C) (Oral)  Ht 5' 5.5" (1.664 m)  Wt 137 lb (62.143 kg)  BMI 22.44 kg/m2  My  repeat blood pressure was 100/58 and this was in the right arm sitting with a manual cuff    Assessment & Plan:  1. Essential hypertension -Home blood pressures were reviewed and in fact many were running low. - BMP8+EGFR  2. Idiopathic hypotension -We will reduce the losartan to 50 mg daily and review blood pressure readings in a couple weeks - BMP8+EGFR  3. Paroxysmal atrial fibrillation -The patient rhythm was regular today. - POCT CBC  4. Thigh contusion, left, initial encounter -Reassurance regarding this as it should resolve on its own  Patient Instructions  Reduce losartan to one half of 100 mg  Monitor blood pressures at home and bring readings by for review and a couple weeks If the blood pressure increases back to the 140-150 range for the top number increase the losartan back to 100 mg    Arrie Senate MD

## 2014-08-27 NOTE — Addendum Note (Signed)
Addended by: Magdalene RiverBULLINS, JAMIE H on: 08/27/2014 12:25 PM   Modules accepted: Orders

## 2014-08-28 LAB — BMP8+EGFR
BUN / CREAT RATIO: 19 (ref 11–26)
BUN: 24 mg/dL (ref 8–27)
CHLORIDE: 94 mmol/L — AB (ref 97–108)
CO2: 25 mmol/L (ref 18–29)
Calcium: 8.9 mg/dL (ref 8.7–10.3)
Creatinine, Ser: 1.25 mg/dL — ABNORMAL HIGH (ref 0.57–1.00)
GFR calc Af Amer: 45 mL/min/{1.73_m2} — ABNORMAL LOW (ref >59)
GFR, EST NON AFRICAN AMERICAN: 39 mL/min/{1.73_m2} — AB (ref >59)
Glucose: 110 mg/dL — ABNORMAL HIGH (ref 65–99)
POTASSIUM: 3.6 mmol/L (ref 3.5–5.2)
Sodium: 137 mmol/L (ref 134–144)

## 2014-08-29 ENCOUNTER — Other Ambulatory Visit: Payer: Self-pay | Admitting: Family Medicine

## 2014-08-29 ENCOUNTER — Encounter (INDEPENDENT_AMBULATORY_CARE_PROVIDER_SITE_OTHER): Payer: Self-pay | Admitting: *Deleted

## 2014-08-30 ENCOUNTER — Telehealth: Payer: Self-pay | Admitting: Family Medicine

## 2014-08-30 NOTE — Telephone Encounter (Signed)
Tammy please review this I'm not sure what they are concerned about is the patient has been on losartan for a good while.

## 2014-08-30 NOTE — Telephone Encounter (Signed)
Patient has been on both for awhile according to chart- should be okay

## 2014-09-01 NOTE — Telephone Encounter (Signed)
After clinical pharmacist review I would discontinue Aricept because of the chance of QT prolongation----please call patient's family and let them know to do this. In other words the Aricept should be stopped.

## 2014-09-01 NOTE — Telephone Encounter (Signed)
I reviewed patient's medications.  There is the possibility of QT prolongation or increase heart block / bradycardia with Aricept / donepzil (and other cholinesterase inhibitors)  Since patient has history of arrhythmias I would recommend that the benefit of continuing Aricept be weighed with the possible risk.  If Aricept is continued then EKG and HR should be monitored.

## 2014-09-02 NOTE — Telephone Encounter (Signed)
[name] called and aware to stop the Aricept.

## 2014-09-03 ENCOUNTER — Ambulatory Visit (INDEPENDENT_AMBULATORY_CARE_PROVIDER_SITE_OTHER): Payer: Medicare Other | Admitting: Family Medicine

## 2014-09-03 ENCOUNTER — Telehealth: Payer: Self-pay | Admitting: Family Medicine

## 2014-09-03 ENCOUNTER — Encounter: Payer: Self-pay | Admitting: Family Medicine

## 2014-09-03 VITALS — BP 172/69 | HR 63 | Temp 97.4°F | Ht 65.5 in | Wt 135.0 lb

## 2014-09-03 DIAGNOSIS — I10 Essential (primary) hypertension: Secondary | ICD-10-CM

## 2014-09-03 DIAGNOSIS — I2510 Atherosclerotic heart disease of native coronary artery without angina pectoris: Secondary | ICD-10-CM

## 2014-09-03 DIAGNOSIS — D649 Anemia, unspecified: Secondary | ICD-10-CM | POA: Diagnosis not present

## 2014-09-03 DIAGNOSIS — R413 Other amnesia: Secondary | ICD-10-CM | POA: Diagnosis not present

## 2014-09-03 DIAGNOSIS — D6489 Other specified anemias: Secondary | ICD-10-CM

## 2014-09-03 DIAGNOSIS — Z09 Encounter for follow-up examination after completed treatment for conditions other than malignant neoplasm: Secondary | ICD-10-CM | POA: Diagnosis not present

## 2014-09-03 DIAGNOSIS — R710 Precipitous drop in hematocrit: Secondary | ICD-10-CM

## 2014-09-03 NOTE — Patient Instructions (Addendum)
CBC in 7- 10 days Hold Aricept  Discuss future use of Aricept with cardiology Increase Losartan to 100 mg Return FOBT The patient should keep her appointment with the cardiologist Continue to check blood pressure readings at home

## 2014-09-03 NOTE — Telephone Encounter (Signed)
Lw/ Aundra Milletmegan

## 2014-09-03 NOTE — Progress Notes (Signed)
Subjective:    Patient ID: [name], female    DOB: Nov 15, 1924, 81 y.o.   MRN: [id number]  HPI Patient here today for hospital follow up from [facility] where she was seen for fatigue, weakness, and decreased hemoglobin. She is accompanied today by her niece Seth Bake, who is an Therapist, sports . The patient complains of leg weakness and bruises easily. The patient was recently evaluated here and after she left the office was found to have a low hemoglobin. She denied having any black tarry stools during the visit. She subsequently ended up in the Hospital because of this low hemoglobin with weakness etc. She has a follow-up visit planned with the gastroenterologist and the cardiologist. She also may need a visit scheduled with the hematologist. The notes from [facility] and lab work and x-rays including CT scans were reviewed today during the visit. These will all be scanned into the record.      Patient Active Problem List   Diagnosis Date Noted  . Atrial fibrillation 04/15/2014  . Edema 04/15/2014  . Nodule of right lung 05/27/2013  . Memory impairment 03/12/2013  . Prophylactic immunotherapy 10/24/2012  . Dyspnea 07/06/2012  . Extrinsic asthma 08/31/2010  . Atrophic vaginitis   . Malaise and fatigue   . Thoracic scoliosis   . Insomnia, idiopathic   . Essential hypertension, benign   . Gastritis   . Hiatal hernia   . Colon polyps   . Diverticulosis   . Osteoarthrosis and allied disorders   . Abdominal pain   . DYSLIPIDEMIA 02/11/2009  . HYPERTENSION 02/10/2009  . GERD 02/10/2009   Outpatient Encounter Prescriptions as of 09/03/2014  Medication Sig  . albuterol (PROAIR HFA) 108 (90 BASE) MCG/ACT inhaler Inhale 2 puffs into the lungs every 6 (six) hours as needed.  Marland Kitchen amiodarone (PACERONE) 200 MG tablet Take 200 mg by mouth daily.   . busPIRone (BUSPAR) 5 MG tablet Take 1 tablet (5 mg total) by mouth at bedtime.  . donepezil (ARICEPT) 10 MG tablet TAKE 1 TABLET DAILY    . furosemide (LASIX) 20 MG tablet Take 20 mg by mouth daily.  Marland Kitchen losartan (COZAAR) 100 MG tablet Take 1 tablet (100 mg total) by mouth daily.  . memantine (NAMENDA) 10 MG tablet Take 1 tablet (10 mg total) by mouth 2 (two) times daily.  . metoprolol succinate (TOPROL-XL) 25 MG 24 hr tablet Take 1 tablet by mouth daily.  Marland Kitchen omeprazole (PRILOSEC) 20 MG capsule TAKE (1) CAPSULE TWICE DAILY.  Marland Kitchen Rivaroxaban (XARELTO) 15 MG TABS tablet Take 1 tablet (15 mg total) by mouth daily with supper.  . SYMBICORT 80-4.5 MCG/ACT inhaler Inhale 2 puffs into the lungs 2 (two) times daily.   No facility-administered encounter medications on file as of 09/03/2014.     Review of Systems  HENT: Negative.   Eyes: Negative.   Respiratory: Negative.   Cardiovascular: Negative.   Gastrointestinal: Negative.   Endocrine: Negative.   Genitourinary: Negative.   Musculoskeletal: Negative.   Skin: Negative.   Allergic/Immunologic: Negative.   Neurological: Positive for weakness (leg weakness ).  Hematological: Bruises/bleeds easily.  Psychiatric/Behavioral: Negative.        Objective:   Physical Exam  Constitutional: She is oriented to person, place, and time. She appears well-developed and well-nourished. No distress.  For 81 years of age the patient looks good  HENT:  Head: Normocephalic and atraumatic.  Eyes: Conjunctivae and EOM are normal. Pupils are equal, round, and reactive to light. Right  eye exhibits no discharge. Left eye exhibits no discharge. No scleral icterus.  Neck: Normal range of motion. Neck supple. No thyromegaly present.  Cardiovascular: Normal rate, regular rhythm and normal heart sounds.   No murmur heard. The rhythm is fairly regular at 72/m  Pulmonary/Chest: Effort normal and breath sounds normal. No respiratory distress. She has no wheezes. She has no rales. She exhibits no tenderness.  Clear anteriorly and posteriorly  Abdominal: Soft. Bowel sounds are normal. She exhibits no mass.  There is no tenderness. There is no rebound and no guarding.  The abdomen was nontender without masses or organ enlargement or bruits  Musculoskeletal: Normal range of motion. She exhibits no edema.  Lymphadenopathy:    She has no cervical adenopathy.  Neurological: She is alert and oriented to person, place, and time.  The patient responded to questions appropriately that were asked of her.  Skin: Skin is warm and dry. No rash noted.  Psychiatric: She has a normal mood and affect. Her behavior is normal. Judgment and thought content normal.  The patient obviously has some memory impairment but not to a severe degree.  Nursing note and vitals reviewed.  BP 172/69 mmHg  Pulse 63  Temp(Src) 97.4 F (36.3 C) (Oral)  Ht 5' 5.5" (1.664 m)  Wt 135 lb (61.236 kg)  BMI 22.12 kg/m2   Repeat blood pressure 162/72     Assessment & Plan:  1. Decreased hemoglobin -The hemoglobin today was 10.3 and this is improved from the hospital discharge. -We will check another CBC in 7-10 days to make sure that it continues to improve - BMP8+EGFR - Hepatic function panel - CBC with Differential/Platelet - Ambulatory referral to Hematology / Oncology  2. Hospital discharge follow-up -The patient is thinking clearer and doing better since she was transfused with 2 units of blood. - BMP8+EGFR - Hepatic function panel - CBC with Differential/Platelet  3. Anemia due to other cause -Because of the low hemoglobin is currently not certain. There is no history of any GI blood loss before she went to the hospital. An appointment with the hematologist will be arranged to further evaluate the anemia.  4. Essential hypertension -The blood pressure has increased again and when her hemoglobin was 7.2 it was down in a very low range. We will increase her low Sartin back to 100 mg daily.  5. ASCVD (arteriosclerotic cardiovascular disease) -She will follow-up with the cardiologist soon and he will address the  issue of taking Aricept with her amiodarone and the increased risk of QT prolongation. In the meantime she will hold the Aricept  6. Memory impairment -This appears to be stable.  Patient Instructions  CBC in 7- 10 days Hold Aricept  Discuss future use of Aricept with cardiology Increase Losartan to 100 mg Return FOBT The patient should keep her appointment with the cardiologist Continue to check blood pressure readings at home   Arrie Senate MD

## 2014-09-04 LAB — CBC WITH DIFFERENTIAL/PLATELET
Basophils Absolute: 0 10*3/uL (ref 0.0–0.2)
Basos: 1 %
EOS (ABSOLUTE): 0.1 10*3/uL (ref 0.0–0.4)
EOS: 1 %
Hematocrit: 31 % — ABNORMAL LOW (ref 34.0–46.6)
Hemoglobin: 10.2 g/dL — ABNORMAL LOW (ref 11.1–15.9)
IMMATURE GRANS (ABS): 0 10*3/uL (ref 0.0–0.1)
Immature Granulocytes: 0 %
LYMPHS ABS: 1.2 10*3/uL (ref 0.7–3.1)
Lymphs: 21 %
MCH: 29.1 pg (ref 26.6–33.0)
MCHC: 32.9 g/dL (ref 31.5–35.7)
MCV: 89 fL (ref 79–97)
MONOCYTES: 14 %
MONOS ABS: 0.8 10*3/uL (ref 0.1–0.9)
NEUTROS ABS: 3.5 10*3/uL (ref 1.4–7.0)
Neutrophils: 63 %
Platelets: 290 10*3/uL (ref 150–379)
RBC: 3.5 x10E6/uL — AB (ref 3.77–5.28)
RDW: 15.6 % — ABNORMAL HIGH (ref 12.3–15.4)
WBC: 5.7 10*3/uL (ref 3.4–10.8)

## 2014-09-04 LAB — BMP8+EGFR
BUN/Creatinine Ratio: 19 (ref 11–26)
BUN: 16 mg/dL (ref 8–27)
CHLORIDE: 97 mmol/L (ref 97–108)
CO2: 27 mmol/L (ref 18–29)
Calcium: 9 mg/dL (ref 8.7–10.3)
Creatinine, Ser: 0.84 mg/dL (ref 0.57–1.00)
GFR calc non Af Amer: 63 mL/min/{1.73_m2} (ref >59)
GFR, EST AFRICAN AMERICAN: 72 mL/min/{1.73_m2} (ref >59)
GLUCOSE: 114 mg/dL — AB (ref 65–99)
Potassium: 3.8 mmol/L (ref 3.5–5.2)
Sodium: 138 mmol/L (ref 134–144)

## 2014-09-04 LAB — HEPATIC FUNCTION PANEL
ALT: 21 IU/L (ref 0–32)
AST: 29 IU/L (ref 0–40)
Albumin: 3.9 g/dL (ref 3.5–4.7)
Alkaline Phosphatase: 92 IU/L (ref 39–117)
BILIRUBIN TOTAL: 1 mg/dL (ref 0.0–1.2)
Bilirubin, Direct: 0.34 mg/dL (ref 0.00–0.40)
TOTAL PROTEIN: 5.7 g/dL — AB (ref 6.0–8.5)

## 2014-09-04 NOTE — Telephone Encounter (Signed)
Patient was seen 09/03/2014 with [name].

## 2014-09-05 ENCOUNTER — Other Ambulatory Visit: Payer: Self-pay | Admitting: Family Medicine

## 2014-09-06 ENCOUNTER — Ambulatory Visit: Payer: Medicare Other | Admitting: Family Medicine

## 2014-09-09 ENCOUNTER — Other Ambulatory Visit: Payer: 59

## 2014-09-12 ENCOUNTER — Other Ambulatory Visit (INDEPENDENT_AMBULATORY_CARE_PROVIDER_SITE_OTHER): Payer: Medicare Other

## 2014-09-12 DIAGNOSIS — D649 Anemia, unspecified: Secondary | ICD-10-CM | POA: Diagnosis not present

## 2014-09-12 LAB — POCT CBC
GRANULOCYTE PERCENT: 66.7 % (ref 37–80)
HCT, POC: 39.2 % (ref 37.7–47.9)
HEMOGLOBIN: 12.2 g/dL (ref 12.2–16.2)
Lymph, poc: 1.7 (ref 0.6–3.4)
MCH, POC: 28.2 pg (ref 27–31.2)
MCHC: 31 g/dL — AB (ref 31.8–35.4)
MCV: 91.1 fL (ref 80–97)
MPV: 7.9 fL (ref 0–99.8)
POC GRANULOCYTE: 4.4 (ref 2–6.9)
POC LYMPH %: 26 % (ref 10–50)
Platelet Count, POC: 279 10*3/uL (ref 142–424)
RBC: 4.31 M/uL (ref 4.04–5.48)
RDW, POC: 16.5 %
WBC: 6.6 10*3/uL (ref 4.6–10.2)

## 2014-09-12 NOTE — Progress Notes (Signed)
Lab only 

## 2014-09-16 DIAGNOSIS — Z7901 Long term (current) use of anticoagulants: Secondary | ICD-10-CM | POA: Insufficient documentation

## 2014-09-16 DIAGNOSIS — Z8679 Personal history of other diseases of the circulatory system: Secondary | ICD-10-CM | POA: Insufficient documentation

## 2014-09-16 DIAGNOSIS — R233 Spontaneous ecchymoses: Secondary | ICD-10-CM | POA: Insufficient documentation

## 2014-09-24 ENCOUNTER — Ambulatory Visit (INDEPENDENT_AMBULATORY_CARE_PROVIDER_SITE_OTHER): Payer: Self-pay | Admitting: Internal Medicine

## 2014-09-24 ENCOUNTER — Ambulatory Visit (INDEPENDENT_AMBULATORY_CARE_PROVIDER_SITE_OTHER): Payer: Medicare Other | Admitting: Family Medicine

## 2014-09-24 DIAGNOSIS — I4891 Unspecified atrial fibrillation: Secondary | ICD-10-CM | POA: Diagnosis not present

## 2014-09-24 DIAGNOSIS — D649 Anemia, unspecified: Secondary | ICD-10-CM | POA: Diagnosis not present

## 2014-09-24 DIAGNOSIS — J439 Emphysema, unspecified: Secondary | ICD-10-CM

## 2014-09-24 DIAGNOSIS — I5030 Unspecified diastolic (congestive) heart failure: Secondary | ICD-10-CM | POA: Diagnosis not present

## 2014-10-18 ENCOUNTER — Ambulatory Visit: Payer: Medicare Other | Admitting: Family Medicine

## 2014-10-18 DIAGNOSIS — D649 Anemia, unspecified: Secondary | ICD-10-CM | POA: Insufficient documentation

## 2014-10-29 ENCOUNTER — Other Ambulatory Visit: Payer: Self-pay

## 2014-10-29 MED ORDER — MEMANTINE HCL 10 MG PO TABS: 10.0000 mg | ORAL_TABLET | Freq: Two times a day (BID) | ORAL | Status: DC

## 2014-10-30 ENCOUNTER — Other Ambulatory Visit: Payer: Self-pay

## 2014-10-30 MED ORDER — MEMANTINE HCL 10 MG PO TABS: 10.0000 mg | ORAL_TABLET | Freq: Two times a day (BID) | ORAL | Status: DC

## 2014-11-04 ENCOUNTER — Other Ambulatory Visit: Payer: Self-pay | Admitting: Family Medicine

## 2014-11-05 ENCOUNTER — Other Ambulatory Visit: Payer: Self-pay | Admitting: Family Medicine

## 2014-11-15 ENCOUNTER — Encounter: Payer: Self-pay | Admitting: Family

## 2014-11-15 ENCOUNTER — Ambulatory Visit (INDEPENDENT_AMBULATORY_CARE_PROVIDER_SITE_OTHER): Payer: Medicare Other | Admitting: Family

## 2014-11-15 VITALS — BP 140/74 | HR 72 | Temp 99.0°F | Ht 60.0 in | Wt 132.6 lb

## 2014-11-15 DIAGNOSIS — I509 Heart failure, unspecified: Secondary | ICD-10-CM | POA: Diagnosis not present

## 2014-11-15 DIAGNOSIS — R0602 Shortness of breath: Secondary | ICD-10-CM | POA: Diagnosis not present

## 2014-11-15 MED ORDER — ALBUTEROL SULFATE (2.5 MG/3ML) 0.083% IN NEBU: 2.5000 mg | INHALATION_SOLUTION | Freq: Four times a day (QID) | RESPIRATORY_TRACT | Status: DC | PRN

## 2014-11-15 NOTE — Patient Instructions (Signed)

## 2014-11-15 NOTE — Progress Notes (Signed)
   Subjective:    Patient ID: [name], female    DOB: June 29, 1924, 81 y.o.   MRN: [id number]  Pt presents to the office today for some slight SOB and wheezing at times. Pt has been fluctuating  her weight 2-3 lbs this week and her lasix was increased to 40 mg from  by her cardiologts. Pt has history of CHF. Shortness of Breath This is a new problem. The current episode started 1 to 4 weeks ago. The problem occurs intermittently. The problem has been unchanged. Associated symptoms include orthopnea ("at times") and wheezing. Pertinent negatives include no chest pain, ear pain, headaches, leg swelling or vomiting. Her past medical history is significant for a heart failure.      Review of Systems  Constitutional: Negative.   HENT: Negative.  Negative for ear pain.   Eyes: Negative.   Respiratory: Positive for wheezing. Negative for shortness of breath.   Cardiovascular: Positive for orthopnea ("at times"). Negative for chest pain, palpitations and leg swelling.  Gastrointestinal: Negative.  Negative for vomiting.  Endocrine: Positive for polydipsia.  Genitourinary: Negative.   Musculoskeletal: Negative.   Neurological: Negative.  Negative for headaches.  Hematological: Negative.   Psychiatric/Behavioral: Negative.   All other systems reviewed and are negative.      Objective:   Physical Exam  Constitutional: She is oriented to person, place, and time. She appears well-developed and well-nourished. No distress.  HENT:  Head: Normocephalic and atraumatic.  Right Ear: External ear normal.  Left Ear: External ear normal.  Nose: Nose normal.  Mouth/Throat: Oropharynx is clear and moist.  Eyes: Pupils are equal, round, and reactive to light.  Neck: Normal range of motion. Neck supple. No thyromegaly present.  Cardiovascular: Normal rate, regular rhythm, normal heart sounds and intact distal pulses.   No murmur heard. Pulmonary/Chest: Effort normal and breath sounds  normal. No respiratory distress. She has no wheezes.  Abdominal: Soft. Bowel sounds are normal. She exhibits no distension. There is no tenderness.  Musculoskeletal: Normal range of motion. She exhibits no edema or tenderness.  Neurological: She is alert and oriented to person, place, and time. She has normal reflexes. No cranial nerve deficit.  Skin: Skin is warm and dry.  Psychiatric: She has a normal mood and affect. Her behavior is normal. Judgment and thought content normal.  Vitals reviewed.     BP 140/74 mmHg  Pulse 72  Temp(Src) 99 F (37.2 C) (Oral)  Ht 5' (1.524 m)  Wt 132 lb 9.6 oz (60.147 kg)  BMI 25.90 kg/m2  SpO2 98%     Assessment & Plan:  1. SOB (shortness of breath) - albuterol (PROVENTIL) (2.5 MG/3ML) 0.083% nebulizer solution; Take 3 mLs (2.5 mg total) by nebulization every 6 (six) hours as needed for wheezing or shortness of breath.  Dispense: 75 mL; Refill: 12  2. Congestive heart failure, unspecified congestive heart failure chronicity, unspecified congestive heart failure type - albuterol (PROVENTIL) (2.5 MG/3ML) 0.083% nebulizer solution; Take 3 mLs (2.5 mg total) by nebulization every 6 (six) hours as needed for wheezing or shortness of breath.  Dispense: 75 mL; Refill: 12  Weigh daily Report >2 lb in one day or >5 lbs in one week Use nebulizer as needed for wheezing- Lungs are clear bilaterally at today's visit RTO prn   Jannifer Rodney, FNP

## 2014-11-18 ENCOUNTER — Other Ambulatory Visit: Payer: Self-pay | Admitting: Family Medicine

## 2014-11-27 ENCOUNTER — Other Ambulatory Visit: Payer: Self-pay | Admitting: Family Medicine

## 2014-12-02 ENCOUNTER — Encounter: Payer: Self-pay | Admitting: Family Medicine

## 2014-12-02 ENCOUNTER — Ambulatory Visit (INDEPENDENT_AMBULATORY_CARE_PROVIDER_SITE_OTHER): Payer: Medicare Other | Admitting: Family Medicine

## 2014-12-02 VITALS — BP 136/74 | HR 70 | Temp 98.5°F | Ht 60.0 in | Wt 136.2 lb

## 2014-12-02 DIAGNOSIS — E875 Hyperkalemia: Secondary | ICD-10-CM

## 2014-12-02 DIAGNOSIS — I509 Heart failure, unspecified: Secondary | ICD-10-CM | POA: Diagnosis not present

## 2014-12-02 DIAGNOSIS — E785 Hyperlipidemia, unspecified: Secondary | ICD-10-CM | POA: Diagnosis not present

## 2014-12-02 DIAGNOSIS — E559 Vitamin D deficiency, unspecified: Secondary | ICD-10-CM | POA: Diagnosis not present

## 2014-12-02 DIAGNOSIS — I10 Essential (primary) hypertension: Secondary | ICD-10-CM | POA: Diagnosis not present

## 2014-12-02 DIAGNOSIS — I4891 Unspecified atrial fibrillation: Secondary | ICD-10-CM | POA: Diagnosis not present

## 2014-12-02 DIAGNOSIS — R0602 Shortness of breath: Secondary | ICD-10-CM | POA: Diagnosis not present

## 2014-12-02 DIAGNOSIS — R413 Other amnesia: Secondary | ICD-10-CM | POA: Diagnosis not present

## 2014-12-02 MED ORDER — PREDNISONE 10 MG PO TABS: 10.0000 mg | ORAL_TABLET | Freq: Every day | ORAL | Status: DC

## 2014-12-02 MED ORDER — ALBUTEROL SULFATE (2.5 MG/3ML) 0.083% IN NEBU: 2.5000 mg | INHALATION_SOLUTION | Freq: Four times a day (QID) | RESPIRATORY_TRACT | Status: DC | PRN

## 2014-12-02 MED ORDER — FUROSEMIDE 20 MG PO TABS: 20.0000 mg | ORAL_TABLET | Freq: Two times a day (BID) | ORAL | Status: DC

## 2014-12-02 NOTE — Patient Instructions (Addendum)
Continue current medications. Continue good therapeutic lifestyle changes which include good diet and exercise. Fall precautions discussed with patient. If an FOBT was given today- please return it to our front desk. If you are over 79 years old - you may need Prevnar 13 or the adult Pneumonia vaccine.   After your visit with [place] today you will receive a survey in the mail or online from [employer] regarding your care with [place]. Please take a moment to fill this out. Your feedback is very important to [place] as you can help [place] better understand your patient needs as well as improve your experience and satisfaction. WE CARE ABOUT YOU!!!   The patient should continue to be careful and not put herself at risk for falling and she should use her walker regularly She should stay as active as possible She should watch her sodium intake as closely as possible She can try reducing the Lasix gradually and increase it again if necessary for fluid retention or weight gain She can also try reducing the prednisone from 10 mg gradually to 5 mg daily but if any worsening in her breathing she should increase this back up to the previous dose We will call with the lab work results as soon as they become available We will refill the albuterol nebulizer, the prednisone and the Lasix Monitor weights daily She should take Tylenol as needed for back pain

## 2014-12-02 NOTE — Progress Notes (Signed)
Subjective:    Patient ID: [name], female    DOB: 1925/03/09, 81 y.o.   MRN: [id number]  HPI  Pt here for follow up and management of chronic medical problems which consist of hypertension and hyperlipidemia. She can takes medication regularly.  She is accompanied by her niece. She is complaining of some cough and back pain. The patient denies chest pain or shortness of breath today. She has no trouble with her swallowing and there is not been any noted blood in the stool or black tarry bowel movements. She is passing her water without problems. She stays by herself at nighttime and has caregivers during the day. The great nieces and her niece keep a regular check on her.          Patient Active Problem List   Diagnosis Date Noted  . CHF (congestive heart failure) 11/15/2014  . Absolute anemia 10/18/2014  . Bruising, spontaneous 09/16/2014  . Long term current use of anticoagulant 09/16/2014  . History of atrial fibrillation 09/16/2014  . Atrial fibrillation 04/15/2014  . Edema 04/15/2014  . Nodule of right lung 05/27/2013  . Memory impairment 03/12/2013  . Prophylactic immunotherapy 10/24/2012  . Dyspnea 07/06/2012  . Extrinsic asthma 08/31/2010  . Atrophic vaginitis   . Malaise and fatigue   . Thoracic scoliosis   . Insomnia, idiopathic   . Essential hypertension, benign   . Gastritis   . Hiatal hernia   . Colon polyps   . Diverticulosis   . Osteoarthrosis and allied disorders   . Abdominal pain   . Hyperlipidemia 02/11/2009  . HYPERTENSION 02/10/2009  . GERD 02/10/2009   Outpatient Encounter Prescriptions as of 12/02/2014  Medication Sig  . albuterol (PROAIR HFA) 108 (90 BASE) MCG/ACT inhaler Inhale 2 puffs into the lungs every 6 (six) hours as needed.  Marland Kitchen albuterol (PROVENTIL) (2.5 MG/3ML) 0.083% nebulizer solution Take 3 mLs (2.5 mg total) by nebulization every 6 (six) hours as needed for wheezing or shortness of breath.  Marland Kitchen amiodarone (PACERONE) 200 MG  tablet Take 100 mg by mouth daily.   . busPIRone (BUSPAR) 5 MG tablet Take 1 tablet (5 mg total) by mouth at bedtime.  . donepezil (ARICEPT) 10 MG tablet TAKE 1 TABLET DAILY  . furosemide (LASIX) 20 MG tablet Take 40 mg by mouth daily.   Marland Kitchen losartan (COZAAR) 100 MG tablet Take 1 tablet (100 mg total) by mouth daily. (Patient taking differently: Take 100 mg by mouth 2 (two) times daily. )  . memantine (NAMENDA) 10 MG tablet Take 1 tablet (10 mg total) by mouth 2 (two) times daily.  . metoprolol succinate (TOPROL-XL) 25 MG 24 hr tablet Take 1 tablet by mouth daily.  Marland Kitchen omeprazole (PRILOSEC) 20 MG capsule TAKE (1) CAPSULE TWICE DAILY.  Marland Kitchen predniSONE (DELTASONE) 10 MG tablet Take 10 mg by mouth daily with breakfast.  . SYMBICORT 80-4.5 MCG/ACT inhaler Inhale 2 puffs into the lungs 2 (two) times daily.  Alveda Reasons 15 MG TABS tablet Take 1 tablet (15 mg total) by mouth daily with supper.  Alveda Reasons 15 MG TABS tablet Take 1 tablet (15 mg total) by mouth daily with supper.   No facility-administered encounter medications on file as of 12/02/2014.     Review of Systems  Constitutional: Negative.   HENT: Negative.   Eyes: Negative.   Respiratory: Positive for cough.   Cardiovascular: Negative.   Gastrointestinal: Negative.   Endocrine: Negative.   Genitourinary: Negative.   Musculoskeletal: Positive for  back pain.  Skin: Negative.   Allergic/Immunologic: Negative.   Neurological: Negative.   Hematological: Negative.   Psychiatric/Behavioral: Negative.        Objective:   Physical Exam  Constitutional: She is oriented to person, place, and time. She appears well-developed and well-nourished. No distress.  The patient appears well and happy and there was no indication of any memory disturbance today during the visit.  HENT:  Head: Normocephalic and atraumatic.  Right Ear: External ear normal.  Left Ear: External ear normal.  Nose: Nose normal.  Mouth/Throat: Oropharynx is clear and moist.    Eyes: Conjunctivae and EOM are normal. Pupils are equal, round, and reactive to light. Right eye exhibits no discharge. Left eye exhibits no discharge. No scleral icterus.  Neck: Normal range of motion. Neck supple. No thyromegaly present.  No carotid bruits or thyromegaly  Cardiovascular: Normal rate, regular rhythm and normal heart sounds.   No murmur heard. The heart had a regular rate and rhythm at 72/m  Pulmonary/Chest: Effort normal and breath sounds normal. No respiratory distress. She has no wheezes. She has no rales. She exhibits no tenderness.  Lungs were clear anteriorly and posteriorly  Abdominal: Soft. Bowel sounds are normal. She exhibits no mass. There is no tenderness. There is no rebound and no guarding.  No abdominal tenderness  Musculoskeletal: Normal range of motion. She exhibits no edema or tenderness.  The patient is doing well using a walker but appeared to have no problems eating off the table on the table etc.  Lymphadenopathy:    She has no cervical adenopathy.  Neurological: She is alert and oriented to person, place, and time. She has normal reflexes. No cranial nerve deficit.  Skin: Skin is warm and dry. No rash noted.  Psychiatric: She has a normal mood and affect. Her behavior is normal. Judgment and thought content normal.  Nursing note and vitals reviewed.  BP 136/74 mmHg  Pulse 70  Temp(Src) 98.5 F (36.9 C) (Oral)  Ht 5' (1.524 m)  Wt 136 lb 3.2 oz (61.78 kg)  BMI 26.60 kg/m2        Assessment & Plan:  1. Essential hypertension -The blood pressure is good today and she will continue with current treatment although she will try to reduce the Lasix gradually depending upon the amount of edema that she has. - BMP8+EGFR - Hepatic function panel - Lipid panel - CBC with Differential/Platelet  2. Hyperkalemia -No treatment change pending results of lab work - BMP8+EGFR - CBC with Differential/Platelet  3. Hyperlipidemia -The patient will  continue with as aggressive therapeutic lifestyle changes as she is sensitive to statin drugs. - BMP8+EGFR - Hepatic function panel - Lipid panel - CBC with Differential/Platelet  4. Vitamin D deficiency -No treatment pending results of lab work - Vit D  25 hydroxy (rtn osteoporosis monitoring) - CBC with Differential/Platelet  5. Congestive heart failure, unspecified congestive heart failure chronicity, unspecified congestive heart failure type -She is doing well with regard to this and has been breathing better since an increased dose of Lasix was given at home and the family is coming reduce this gradually. - albuterol (PROVENTIL) (2.5 MG/3ML) 0.083% nebulizer solution; Take 3 mLs (2.5 mg total) by nebulization every 6 (six) hours as needed for wheezing or shortness of breath.  Dispense: 75 mL; Refill: 11 - CBC with Differential/Platelet  6. Atrial fibrillation paroxysmal -She was in normal sinus rhythm today - CBC with Differential/Platelet  7. Memory impairment -The patient with her  treatment for memory impairment and this seems to be stable from my perspective. - CBC with Differential/Platelet  8. SOB (shortness of breath) -She will continue with her breathing treatments and Lasix as directed during the visit today. - albuterol (PROVENTIL) (2.5 MG/3ML) 0.083% nebulizer solution; Take 3 mLs (2.5 mg total) by nebulization every 6 (six) hours as needed for wheezing or shortness of breath.  Dispense: 75 mL; Refill: 11 - CBC with Differential/Platelet  Meds ordered this encounter  Medications  . DISCONTD: predniSONE (DELTASONE) 10 MG tablet    Sig: Take 10 mg by mouth daily with breakfast.  . furosemide (LASIX) 20 MG tablet    Sig: Take 1 tablet (20 mg total) by mouth 2 (two) times daily. As direction    Dispense:  60 tablet    Refill:  2  . albuterol (PROVENTIL) (2.5 MG/3ML) 0.083% nebulizer solution    Sig: Take 3 mLs (2.5 mg total) by nebulization every 6 (six) hours as  needed for wheezing or shortness of breath.    Dispense:  75 mL    Refill:  11  . predniSONE (DELTASONE) 10 MG tablet    Sig: Take 1 tablet (10 mg total) by mouth daily with breakfast.    Dispense:  30 tablet    Refill:  0   Patient Instructions  Continue current medications. Continue good therapeutic lifestyle changes which include good diet and exercise. Fall precautions discussed with patient. If an FOBT was given today- please return it to our front desk. If you are over 43 years old - you may need Prevnar 40 or the adult Pneumonia vaccine.   After your visit with [place] today you will receive a survey in the mail or online from [employer] regarding your care with [place]. Please take a moment to fill this out. Your feedback is very important to [place] as you can help [place] better understand your patient needs as well as improve your experience and satisfaction. WE CARE ABOUT YOU!!!   The patient should continue to be careful and not put herself at risk for falling and she should use her walker regularly She should stay as active as possible She should watch her sodium intake as closely as possible She can try reducing the Lasix gradually and increase it again if necessary for fluid retention or weight gain She can also try reducing the prednisone from 10 mg gradually to 5 mg daily but if any worsening in her breathing she should increase this back up to the previous dose We will call with the lab work results as soon as they become available We will refill the albuterol nebulizer, the prednisone and the Lasix Monitor weights daily She should take Tylenol as needed for back pain   Arrie Senate MD

## 2014-12-03 ENCOUNTER — Other Ambulatory Visit: Payer: Self-pay | Admitting: Family Medicine

## 2014-12-03 LAB — HEPATIC FUNCTION PANEL
ALBUMIN: 4.1 g/dL (ref 3.5–4.7)
ALK PHOS: 65 IU/L (ref 39–117)
ALT: 19 IU/L (ref 0–32)
AST: 20 IU/L (ref 0–40)
BILIRUBIN, DIRECT: 0.12 mg/dL (ref 0.00–0.40)
Bilirubin Total: 0.3 mg/dL (ref 0.0–1.2)
TOTAL PROTEIN: 6.2 g/dL (ref 6.0–8.5)

## 2014-12-03 LAB — BMP8+EGFR
BUN/Creatinine Ratio: 17 (ref 11–26)
BUN: 15 mg/dL (ref 8–27)
CALCIUM: 9.5 mg/dL (ref 8.7–10.3)
CHLORIDE: 95 mmol/L — AB (ref 97–108)
CO2: 29 mmol/L (ref 18–29)
CREATININE: 0.89 mg/dL (ref 0.57–1.00)
GFR calc Af Amer: 67 mL/min/{1.73_m2} (ref >59)
GFR, EST NON AFRICAN AMERICAN: 58 mL/min/{1.73_m2} — AB (ref >59)
Glucose: 90 mg/dL (ref 65–99)
POTASSIUM: 4.1 mmol/L (ref 3.5–5.2)
Sodium: 141 mmol/L (ref 134–144)

## 2014-12-03 LAB — CBC WITH DIFFERENTIAL/PLATELET
BASOS ABS: 0 10*3/uL (ref 0.0–0.2)
Basos: 0 %
EOS (ABSOLUTE): 0 10*3/uL (ref 0.0–0.4)
EOS: 0 %
HEMATOCRIT: 38.6 % (ref 34.0–46.6)
Hemoglobin: 12.6 g/dL (ref 11.1–15.9)
Immature Grans (Abs): 0 10*3/uL (ref 0.0–0.1)
Immature Granulocytes: 0 %
LYMPHS ABS: 1.1 10*3/uL (ref 0.7–3.1)
Lymphs: 11 %
MCH: 29.7 pg (ref 26.6–33.0)
MCHC: 32.6 g/dL (ref 31.5–35.7)
MCV: 91 fL (ref 79–97)
MONOS ABS: 0.8 10*3/uL (ref 0.1–0.9)
Monocytes: 8 %
Neutrophils Absolute: 8.4 10*3/uL — ABNORMAL HIGH (ref 1.4–7.0)
Neutrophils: 81 %
Platelets: 237 10*3/uL (ref 150–379)
RBC: 4.24 x10E6/uL (ref 3.77–5.28)
RDW: 14.2 % (ref 12.3–15.4)
WBC: 10.4 10*3/uL (ref 3.4–10.8)

## 2014-12-03 LAB — LIPID PANEL
Chol/HDL Ratio: 2.4 ratio units (ref 0.0–4.4)
Cholesterol, Total: 228 mg/dL — ABNORMAL HIGH (ref 100–199)
HDL: 95 mg/dL (ref >39)
LDL Calculated: 101 mg/dL — ABNORMAL HIGH (ref 0–99)
Triglycerides: 158 mg/dL — ABNORMAL HIGH (ref 0–149)
VLDL Cholesterol Cal: 32 mg/dL (ref 5–40)

## 2014-12-03 LAB — VITAMIN D 25 HYDROXY (VIT D DEFICIENCY, FRACTURES): Vit D, 25-Hydroxy: 24.2 ng/mL — ABNORMAL LOW (ref 30.0–100.0)

## 2015-01-23 ENCOUNTER — Other Ambulatory Visit: Payer: Self-pay | Admitting: Family Medicine

## 2015-02-06 ENCOUNTER — Other Ambulatory Visit: Payer: Self-pay | Admitting: Family Medicine

## 2015-02-11 ENCOUNTER — Ambulatory Visit (INDEPENDENT_AMBULATORY_CARE_PROVIDER_SITE_OTHER): Payer: Medicare Other

## 2015-02-11 DIAGNOSIS — Z23 Encounter for immunization: Secondary | ICD-10-CM | POA: Diagnosis not present

## 2015-03-06 ENCOUNTER — Other Ambulatory Visit: Payer: Self-pay | Admitting: Family

## 2015-03-12 ENCOUNTER — Other Ambulatory Visit: Payer: Self-pay | Admitting: *Deleted

## 2015-03-12 MED ORDER — LEVOTHYROXINE SODIUM 25 MCG PO TABS: 25.0000 ug | ORAL_TABLET | Freq: Every day | ORAL | Status: DC

## 2015-03-12 NOTE — Telephone Encounter (Signed)
This levothyroxine 25 was sent in due to corresponding labs that will be scanned in

## 2015-03-21 ENCOUNTER — Other Ambulatory Visit: Payer: Self-pay | Admitting: Family Medicine

## 2015-03-26 ENCOUNTER — Encounter: Payer: Self-pay | Admitting: Family Medicine

## 2015-03-31 ENCOUNTER — Encounter: Payer: Self-pay | Admitting: Family Medicine

## 2015-03-31 ENCOUNTER — Ambulatory Visit (INDEPENDENT_AMBULATORY_CARE_PROVIDER_SITE_OTHER): Payer: Medicare Other | Admitting: Family Medicine

## 2015-03-31 VITALS — BP 123/68 | HR 58 | Temp 97.5°F | Ht 60.0 in | Wt 138.0 lb

## 2015-03-31 DIAGNOSIS — E039 Hypothyroidism, unspecified: Secondary | ICD-10-CM | POA: Diagnosis not present

## 2015-03-31 DIAGNOSIS — E038 Other specified hypothyroidism: Secondary | ICD-10-CM

## 2015-03-31 DIAGNOSIS — R062 Wheezing: Secondary | ICD-10-CM | POA: Diagnosis not present

## 2015-03-31 DIAGNOSIS — R0602 Shortness of breath: Secondary | ICD-10-CM

## 2015-03-31 DIAGNOSIS — I509 Heart failure, unspecified: Secondary | ICD-10-CM | POA: Diagnosis not present

## 2015-03-31 DIAGNOSIS — E785 Hyperlipidemia, unspecified: Secondary | ICD-10-CM

## 2015-03-31 DIAGNOSIS — R413 Other amnesia: Secondary | ICD-10-CM | POA: Diagnosis not present

## 2015-03-31 DIAGNOSIS — E559 Vitamin D deficiency, unspecified: Secondary | ICD-10-CM | POA: Diagnosis not present

## 2015-03-31 DIAGNOSIS — K219 Gastro-esophageal reflux disease without esophagitis: Secondary | ICD-10-CM

## 2015-03-31 DIAGNOSIS — L819 Disorder of pigmentation, unspecified: Secondary | ICD-10-CM

## 2015-03-31 DIAGNOSIS — I10 Essential (primary) hypertension: Secondary | ICD-10-CM | POA: Diagnosis not present

## 2015-03-31 DIAGNOSIS — E034 Atrophy of thyroid (acquired): Secondary | ICD-10-CM

## 2015-03-31 DIAGNOSIS — I4891 Unspecified atrial fibrillation: Secondary | ICD-10-CM | POA: Diagnosis not present

## 2015-03-31 DIAGNOSIS — R911 Solitary pulmonary nodule: Secondary | ICD-10-CM | POA: Diagnosis not present

## 2015-03-31 MED ORDER — ALBUTEROL SULFATE HFA 108 (90 BASE) MCG/ACT IN AERS: 2.0000 | INHALATION_SPRAY | Freq: Four times a day (QID) | RESPIRATORY_TRACT | Status: DC | PRN

## 2015-03-31 MED ORDER — OMEPRAZOLE 20 MG PO CPDR: DELAYED_RELEASE_CAPSULE | ORAL | Status: DC

## 2015-03-31 MED ORDER — LEVOTHYROXINE SODIUM 25 MCG PO TABS
25.0000 ug | ORAL_TABLET | Freq: Every day | ORAL | Status: DC
Start: 2015-03-31 — End: 2015-04-17

## 2015-03-31 MED ORDER — RIVAROXABAN 15 MG PO TABS: ORAL_TABLET | ORAL | Status: DC

## 2015-03-31 MED ORDER — MEMANTINE HCL 10 MG PO TABS: ORAL_TABLET | ORAL | Status: DC

## 2015-03-31 MED ORDER — BUSPIRONE HCL 5 MG PO TABS: 5.0000 mg | ORAL_TABLET | Freq: Every day | ORAL | Status: DC

## 2015-03-31 MED ORDER — LOSARTAN POTASSIUM 100 MG PO TABS: 100.0000 mg | ORAL_TABLET | Freq: Every day | ORAL | Status: DC

## 2015-03-31 MED ORDER — DONEPEZIL HCL 10 MG PO TABS: 10.0000 mg | ORAL_TABLET | Freq: Every day | ORAL | Status: DC

## 2015-03-31 MED ORDER — FUROSEMIDE 20 MG PO TABS: ORAL_TABLET | ORAL | Status: DC

## 2015-03-31 MED ORDER — BUDESONIDE-FORMOTEROL FUMARATE 80-4.5 MCG/ACT IN AERO: 2.0000 | INHALATION_SPRAY | Freq: Two times a day (BID) | RESPIRATORY_TRACT | Status: DC

## 2015-03-31 MED ORDER — AMIODARONE HCL 200 MG PO TABS: 100.0000 mg | ORAL_TABLET | Freq: Every day | ORAL | Status: DC

## 2015-03-31 MED ORDER — METOPROLOL SUCCINATE ER 25 MG PO TB24: 25.0000 mg | ORAL_TABLET | Freq: Every day | ORAL | Status: DC

## 2015-03-31 MED ORDER — ALBUTEROL SULFATE (2.5 MG/3ML) 0.083% IN NEBU: 2.5000 mg | INHALATION_SOLUTION | Freq: Four times a day (QID) | RESPIRATORY_TRACT | Status: DC | PRN

## 2015-03-31 NOTE — Addendum Note (Signed)
Addended by: Magdalene RiverBULLINS, Gianmarco Roye H on: 03/31/2015 04:48 PM   Modules accepted: Orders

## 2015-03-31 NOTE — Patient Instructions (Addendum)
Medicare Annual Wellness Visit  [place] and the medical providers at [facility] strive to bring you the best medical care.  In doing so we not only want to address your current medical conditions and concerns but also to detect new conditions early and prevent illness, disease and health-related problems.    Medicare offers a yearly Wellness Visit which allows our clinical staff to assess your need for preventative services including immunizations, lifestyle education, counseling to decrease risk of preventable diseases and screening for fall risk and other medical concerns.    This visit is provided free of charge (no copay) for all Medicare recipients. The clinical pharmacists at [facility] have begun to conduct these Wellness Visits which will also include a thorough review of all your medications.    As you primary medical provider recommend that you make an appointment for your Annual Wellness Visit if you have not done so already this year.  You may set up this appointment before you leave today or you may call back [phone] and schedule an appointment.  Please make sure when you call that you mention that you are scheduling your Annual Wellness Visit with the clinical pharmacist so that the appointment may be made for the proper length of time.     Continue current medications. Continue good therapeutic lifestyle changes which include good diet and exercise. Fall precautions discussed with patient. If an FOBT was given today- please return it to our front desk. If you are over 79 years old - you may need Prevnar 13 or the adult Pneumonia vaccine.  **Flu shots are available--- please call and schedule a FLU-CLINIC appointment**  After your visit with us today you will receive a survey in the mail or online from [employer] regarding your care with us. Please take a moment to fill this out. Your feedback is very  important to us as you can help us better understand your patient needs as well as improve your experience and satisfaction. WE CARE ABOUT YOU!!!   She should return to the office in a couple weeks and get all of her lab work including her thyroid profile and after reviewing that we will adjust up her medication as needed. She should drink plenty of fluids and stay well hydrated She should use nasal saline being careful not to REM it against the nasal septum when using it. She should use this 3 or 4 times daily and use the nasal saline gel at nighttime to each nostril She should keep the house as cool as possible and keep his much humidity in the house as possible this winter She should continue with her current thyroid dose pending results of lab work She should follow-up with cardiology as planned We will arrange for her to have an appointment with the dermatologist regarding the skin lesion on her right cheek

## 2015-03-31 NOTE — Progress Notes (Signed)
Subjective:    Patient ID: [name], female    DOB: [birth year], 81 y.o.   MRN: [id number]  HPI Pt here for follow up and management of chronic medical problems which includes hyperlipidemia, hypothyroid, and hypertension. She is taking medications regularly and is accompanied today by her niece, Vaughan Basta. The patient complains today of some occasional nosebleeds and also with her eyes and nose running clear liquid. She needs refills on all her medicines. She was recently seen by the cardiologist and found to have hypothyroidism and has subsequently been starting on levothyroxine 25 g. She's been on this medication for about 4 weeks. The initial appearance today seems to be that she is looking good and feeling well and is using her walker. She will get her lab work in 2 weeks and at that time she would be on the thyroid medicine for 6 weeks. The patient and her caregiver or niece will be reminded that it takes a while for the thyroid to get adequately replaced and that we will raise the medication slowly pending the results of the previous lab work. The patient is alert and smiling. She denies chest pain shortness of breath trouble swallowing and heartburn indigestion nausea vomiting diarrhea or blood in the stool. She is passing her water without problems. She responded appropriately to questions asked of her and was smiling and laughing and making jokes.     Patient Active Problem List   Diagnosis Date Noted  . CHF (congestive heart failure) ([place]) 11/15/2014  . Absolute anemia 10/18/2014  . Bruising, spontaneous 09/16/2014  . Long term current use of anticoagulant 09/16/2014  . History of atrial fibrillation 09/16/2014  . Atrial fibrillation ([place]) 04/15/2014  . Edema 04/15/2014  . Nodule of right lung 05/27/2013  . Memory impairment 03/12/2013  . Prophylactic immunotherapy 10/24/2012  . Dyspnea 07/06/2012  . Extrinsic asthma 08/31/2010  . Atrophic vaginitis   . Malaise and fatigue    . Thoracic scoliosis   . Insomnia, idiopathic   . Essential hypertension, benign   . Gastritis   . Hiatal hernia   . Colon polyps   . Diverticulosis   . Osteoarthrosis and allied disorders   . Abdominal pain   . Hyperlipidemia 02/11/2009  . HYPERTENSION 02/10/2009  . GERD 02/10/2009   Outpatient Encounter Prescriptions as of 03/31/2015  Medication Sig  . albuterol (PROAIR HFA) 108 (90 BASE) MCG/ACT inhaler Inhale 2 puffs into the lungs every 6 (six) hours as needed.  Marland Kitchen albuterol (PROVENTIL) (2.5 MG/3ML) 0.083% nebulizer solution Take 3 mLs (2.5 mg total) by nebulization every 6 (six) hours as needed for wheezing or shortness of breath.  Marland Kitchen amiodarone (PACERONE) 200 MG tablet Take 0.5 tablets (100 mg total) by mouth daily.  . budesonide-formoterol (SYMBICORT) 80-4.5 MCG/ACT inhaler Inhale 2 puffs into the lungs 2 (two) times daily.  . busPIRone (BUSPAR) 5 MG tablet Take 1 tablet (5 mg total) by mouth at bedtime.  . donepezil (ARICEPT) 10 MG tablet Take 1 tablet (10 mg total) by mouth daily.  . furosemide (LASIX) 20 MG tablet TAKE  (1)  TABLET A DAY AS DIRECTED.  Marland Kitchen levothyroxine (LEVOTHROID) 25 MCG tablet Take 1 tablet (25 mcg total) by mouth daily before breakfast.  . losartan (COZAAR) 100 MG tablet Take 1 tablet (100 mg total) by mouth daily.  . memantine (NAMENDA) 10 MG tablet Take 1 tablet (10 mg total) by mouth 2 (two) times daily.  . metoprolol succinate (TOPROL-XL) 25 MG 24 hr tablet Take  1 tablet (25 mg total) by mouth daily.  Marland Kitchen omeprazole (PRILOSEC) 20 MG capsule TAKE (1) CAPSULE TWICE DAILY.  Marland Kitchen Rivaroxaban (XARELTO) 15 MG TABS tablet TAKE (1) TABLET DAILY WITH SUPPER.  . [DISCONTINUED] albuterol (PROAIR HFA) 108 (90 BASE) MCG/ACT inhaler Inhale 2 puffs into the lungs every 6 (six) hours as needed.  . [DISCONTINUED] albuterol (PROVENTIL) (2.5 MG/3ML) 0.083% nebulizer solution Take 3 mLs (2.5 mg total) by nebulization every 6 (six) hours as needed for wheezing or shortness of  breath.  . [DISCONTINUED] amiodarone (PACERONE) 200 MG tablet Take 100 mg by mouth daily.   . [DISCONTINUED] busPIRone (BUSPAR) 5 MG tablet TAKE ONE TABLET AT BEDTIME  . [DISCONTINUED] donepezil (ARICEPT) 10 MG tablet TAKE 1 TABLET DAILY  . [DISCONTINUED] furosemide (LASIX) 20 MG tablet TAKE  (1)  TABLET TWICE A DAY AS DIRECTED. (Patient taking differently: TAKE  (1)  TABLET A DAY AS DIRECTED.)  . [DISCONTINUED] levothyroxine (LEVOTHROID) 25 MCG tablet Take 1 tablet (25 mcg total) by mouth daily before breakfast.  . [DISCONTINUED] losartan (COZAAR) 100 MG tablet Take 1 tablet (100 mg total) by mouth daily.  . [DISCONTINUED] memantine (NAMENDA) 10 MG tablet Take 1 tablet (10 mg total) by mouth 2 (two) times daily.  . [DISCONTINUED] metoprolol succinate (TOPROL-XL) 25 MG 24 hr tablet Take 1 tablet by mouth daily.  . [DISCONTINUED] omeprazole (PRILOSEC) 20 MG capsule TAKE (1) CAPSULE TWICE DAILY.  . [DISCONTINUED] predniSONE (DELTASONE) 10 MG tablet Take 1 tablet (10 mg total) by mouth daily with breakfast.  . [DISCONTINUED] SYMBICORT 80-4.5 MCG/ACT inhaler Inhale 2 puffs into the lungs 2 (two) times daily.  . [DISCONTINUED] XARELTO 15 MG TABS tablet Take 1 tablet (15 mg total) by mouth daily with supper.  . [DISCONTINUED] XARELTO 15 MG TABS tablet TAKE (1) TABLET DAILY WITH SUPPER.   No facility-administered encounter medications on file as of 03/31/2015.     Review of Systems  Constitutional: Negative.   HENT: Positive for nosebleeds and postnasal drip.   Eyes: Positive for discharge (watery).  Respiratory: Negative.   Cardiovascular: Negative.   Gastrointestinal: Negative.   Endocrine: Negative.   Genitourinary: Negative.   Musculoskeletal: Negative.   Skin: Negative.   Allergic/Immunologic: Negative.   Neurological: Negative.   Hematological: Negative.   Psychiatric/Behavioral: Negative.        Objective:   Physical Exam  Constitutional: She is oriented to person, place, and  time. She appears well-developed and well-nourished. No distress.  HENT:  Head: Normocephalic and atraumatic.  Mouth/Throat: Oropharynx is clear and moist.  There is some ears cerumen bilaterally. The left nasal septum was irritated and had a blood clot on it.  Eyes: Conjunctivae and EOM are normal. Pupils are equal, round, and reactive to light. Right eye exhibits no discharge. Left eye exhibits no discharge. No scleral icterus.  Is still some slight swelling around the orbits of both eyes.  Neck: Normal range of motion. Neck supple. No thyromegaly present.  Cardiovascular: Normal rate, regular rhythm, normal heart sounds and intact distal pulses.  Exam reveals no gallop and no friction rub.   No murmur heard. The heart had a regular rate and rhythm today at 72/m  Pulmonary/Chest: Effort normal and breath sounds normal. No respiratory distress. She has no wheezes. She has no rales. She exhibits no tenderness.  Clear anteriorly and posteriorly  Abdominal: Soft. Bowel sounds are normal. She exhibits no mass. There is no tenderness. There is no rebound and no guarding.  The abdomen had  no liver or spleen enlargement and no tenderness to palpation  Musculoskeletal: Normal range of motion. She exhibits no edema or tenderness.  The patient is using her rolling walker without problems.  Lymphadenopathy:    She has no cervical adenopathy.  Neurological: She is alert and oriented to person, place, and time.  Mentally today the patient appeared to be intact and alert and responding appropriately and admits to her memory impairment.  Skin: Skin is warm and dry. No rash noted.  The patient has a crusty skin lesion that is somewhat friable on the right cheek and we will make arrangements for her to see the dermatologist and have this biopsied  Psychiatric: She has a normal mood and affect. Her behavior is normal. Judgment and thought content normal.  Nursing note and vitals reviewed.  BP 123/68 mmHg   Pulse 58  Temp(Src) 97.5 F (36.4 C) (Oral)  Ht 5' (1.524 m)  Wt 138 lb (62.596 kg)  BMI 26.95 kg/m2        Assessment & Plan:  1. Hyperlipidemia -Continue current treatment pending results of lab work and this basically includes intact. - CBC with Differential/Platelet; Future - Lipid panel; Future  2. Vitamin D deficiency -Continue vitamin D replacement if in a multi-vitamin pending results of lab work - CBC with Differential/Platelet; Future - VITAMIN D 25 Hydroxy (Vit-D Deficiency, Fractures); Future  3. Congestive heart failure, unspecified congestive heart failure chronicity, unspecified congestive heart failure type ([place]) -Continue regular follow-up with cardiology - albuterol (PROVENTIL) (2.5 MG/3ML) 0.083% nebulizer solution; Take 3 mLs (2.5 mg total) by nebulization every 6 (six) hours as needed for wheezing or shortness of breath.  Dispense: 75 mL; Refill: 11 - CBC with Differential/Platelet; Future  4. Atrial fibrillation paroxysmal -Continue follow-up with cardiology - CBC with Differential/Platelet; Future  5. Gastroesophageal reflux disease, esophagitis presence not specified -Patient is having no problems with this and will continue with her omeprazole - CBC with Differential/Platelet; Future - Hepatic function panel; Future  6. Essential hypertension -The blood pressure is good today and she will continue with her current treatment. - BMP8+EGFR; Future - CBC with Differential/Platelet; Future - Hepatic function panel; Future  7. SOB (shortness of breath) -She is breathing well and having no problems with her COPD and we'll continue to use the inhaler/nebulizer as needed - albuterol (PROVENTIL) (2.5 MG/3ML) 0.083% nebulizer solution; Take 3 mLs (2.5 mg total) by nebulization every 6 (six) hours as needed for wheezing or shortness of breath.  Dispense: 75 mL; Refill: 11 - CBC with Differential/Platelet; Future  8. Nodule of right lung -  budesonide-formoterol (SYMBICORT) 80-4.5 MCG/ACT inhaler; Inhale 2 puffs into the lungs 2 (two) times daily.  Dispense: 1 Inhaler; Refill: 11 - CBC with Differential/Platelet; Future  9. Wheezing -She is stable with her wheezing will continue to use her inhaler/nebulizer - albuterol (PROAIR HFA) 108 (90 BASE) MCG/ACT inhaler; Inhale 2 puffs into the lungs every 6 (six) hours as needed.  Dispense: 1 Inhaler; Refill: 3 - CBC with Differential/Platelet; Future  10. Hypothyroidism, unspecified hypothyroidism type -We will check a thyroid profile and a couple weeks and titrate her medicine at that time - Thyroid Panel With TSH; Future  11. Memory impairment -This appears to be stable with taking Aricept and Namenda  12. Hypothyroidism due to acquired atrophy of thyroid -Adjust thyroid medicine with the next profile.   13. Atypical pigmented skin lesion -Referred to dermatology  Meds ordered this encounter  Medications  . albuterol (PROVENTIL) (2.5  MG/3ML) 0.083% nebulizer solution    Sig: Take 3 mLs (2.5 mg total) by nebulization every 6 (six) hours as needed for wheezing or shortness of breath.    Dispense:  75 mL    Refill:  11  . budesonide-formoterol (SYMBICORT) 80-4.5 MCG/ACT inhaler    Sig: Inhale 2 puffs into the lungs 2 (two) times daily.    Dispense:  1 Inhaler    Refill:  11  . albuterol (PROAIR HFA) 108 (90 BASE) MCG/ACT inhaler    Sig: Inhale 2 puffs into the lungs every 6 (six) hours as needed.    Dispense:  1 Inhaler    Refill:  3  . busPIRone (BUSPAR) 5 MG tablet    Sig: Take 1 tablet (5 mg total) by mouth at bedtime.    Dispense:  30 tablet    Refill:  3  . donepezil (ARICEPT) 10 MG tablet    Sig: Take 1 tablet (10 mg total) by mouth daily.    Dispense:  30 tablet    Refill:  5  . levothyroxine (LEVOTHROID) 25 MCG tablet    Sig: Take 1 tablet (25 mcg total) by mouth daily before breakfast.    Dispense:  30 tablet    Refill:  2  . losartan (COZAAR) 100 MG  tablet    Sig: Take 1 tablet (100 mg total) by mouth daily.    Dispense:  90 tablet    Refill:  3  . memantine (NAMENDA) 10 MG tablet    Sig: Take 1 tablet (10 mg total) by mouth 2 (two) times daily.    Dispense:  60 tablet    Refill:  4  . metoprolol succinate (TOPROL-XL) 25 MG 24 hr tablet    Sig: Take 1 tablet (25 mg total) by mouth daily.    Dispense:  90 tablet    Refill:  3  . omeprazole (PRILOSEC) 20 MG capsule    Sig: TAKE (1) CAPSULE TWICE DAILY.    Dispense:  60 capsule    Refill:  4  . Rivaroxaban (XARELTO) 15 MG TABS tablet    Sig: TAKE (1) TABLET DAILY WITH SUPPER.    Dispense:  30 tablet    Refill:  4  . furosemide (LASIX) 20 MG tablet    Sig: TAKE  (1)  TABLET A DAY AS DIRECTED.    Dispense:  60 tablet    Refill:  2  . amiodarone (PACERONE) 200 MG tablet    Sig: Take 0.5 tablets (100 mg total) by mouth daily.    Dispense:  45 tablet    Refill:  3   Patient Instructions                       Medicare Annual Wellness Visit  [place] and the medical providers at [facility] strive to bring you the best medical care.  In doing so we not only want to address your current medical conditions and concerns but also to detect new conditions early and prevent illness, disease and health-related problems.    Medicare offers a yearly Wellness Visit which allows our clinical staff to assess your need for preventative services including immunizations, lifestyle education, counseling to decrease risk of preventable diseases and screening for fall risk and other medical concerns.    This visit is provided free of charge (no copay) for all Medicare recipients. The clinical pharmacists at [facility] have begun to conduct these Wellness Visits  which will also include a thorough review of all your medications.    As you primary medical provider recommend that you make an appointment for your Annual Wellness Visit if you have not done  so already this year.  You may set up this appointment before you leave today or you may call back [phone]) and schedule an appointment.  Please make sure when you call that you mention that you are scheduling your Annual Wellness Visit with the clinical pharmacist so that the appointment may be made for the proper length of time.     Continue current medications. Continue good therapeutic lifestyle changes which include good diet and exercise. Fall precautions discussed with patient. If an FOBT was given today- please return it to our front desk. If you are over 25 years old - you may need Prevnar 44 or the adult Pneumonia vaccine.  **Flu shots are available--- please call and schedule a FLU-CLINIC appointment**  After your visit with [place] today you will receive a survey in the mail or online from [employer] regarding your care with [place]. Please take a moment to fill this out. Your feedback is very important to [place] as you can help [place] better understand your patient needs as well as improve your experience and satisfaction. WE CARE ABOUT YOU!!!   She should return to the office in a couple weeks and get all of her lab work including her thyroid profile and after reviewing that we will adjust up her medication as needed. She should drink plenty of fluids and stay well hydrated She should use nasal saline being careful not to REM it against the nasal septum when using it. She should use this 3 or 4 times daily and use the nasal saline gel at nighttime to each nostril She should keep the house as cool as possible and keep his much humidity in the house as possible this winter She should continue with her current thyroid dose pending results of lab work She should follow-up with cardiology as planned We will arrange for her to have an appointment with the dermatologist regarding the skin lesion on her right cheek   Arrie Senate MD

## 2015-04-03 ENCOUNTER — Telehealth: Payer: Self-pay

## 2015-04-03 NOTE — Telephone Encounter (Signed)
Insurance denied prior authorization for Symbicort   This drug is a benefit exclusion

## 2015-04-03 NOTE — Telephone Encounter (Signed)
Please give patient additional samples of Symbicort if available and discuss with pharmacist what her insurance will pay for that is like this medication

## 2015-04-07 NOTE — Telephone Encounter (Signed)
Sabrina MaidensLinda Ruiz aware that Samples are ready for pick up.

## 2015-04-14 ENCOUNTER — Other Ambulatory Visit (INDEPENDENT_AMBULATORY_CARE_PROVIDER_SITE_OTHER): Payer: Medicare Other

## 2015-04-14 DIAGNOSIS — R0602 Shortness of breath: Secondary | ICD-10-CM

## 2015-04-14 DIAGNOSIS — I509 Heart failure, unspecified: Secondary | ICD-10-CM

## 2015-04-14 DIAGNOSIS — E559 Vitamin D deficiency, unspecified: Secondary | ICD-10-CM

## 2015-04-14 DIAGNOSIS — E039 Hypothyroidism, unspecified: Secondary | ICD-10-CM

## 2015-04-14 DIAGNOSIS — I10 Essential (primary) hypertension: Secondary | ICD-10-CM

## 2015-04-14 DIAGNOSIS — K219 Gastro-esophageal reflux disease without esophagitis: Secondary | ICD-10-CM

## 2015-04-14 DIAGNOSIS — R911 Solitary pulmonary nodule: Secondary | ICD-10-CM

## 2015-04-14 DIAGNOSIS — I4891 Unspecified atrial fibrillation: Secondary | ICD-10-CM

## 2015-04-14 DIAGNOSIS — R062 Wheezing: Secondary | ICD-10-CM

## 2015-04-14 DIAGNOSIS — E785 Hyperlipidemia, unspecified: Secondary | ICD-10-CM

## 2015-04-14 NOTE — Progress Notes (Signed)
Lab only 

## 2015-04-15 LAB — BMP8+EGFR
BUN/Creatinine Ratio: 14 (ref 11–26)
BUN: 16 mg/dL (ref 8–27)
CALCIUM: 9.4 mg/dL (ref 8.7–10.3)
CHLORIDE: 94 mmol/L — AB (ref 96–106)
CO2: 31 mmol/L — AB (ref 18–29)
Creatinine, Ser: 1.13 mg/dL — ABNORMAL HIGH (ref 0.57–1.00)
GFR calc non Af Amer: 43 mL/min/{1.73_m2} — ABNORMAL LOW (ref >59)
GFR, EST AFRICAN AMERICAN: 50 mL/min/{1.73_m2} — AB (ref >59)
Glucose: 90 mg/dL (ref 65–99)
Potassium: 4 mmol/L (ref 3.5–5.2)
Sodium: 141 mmol/L (ref 134–144)

## 2015-04-15 LAB — CBC WITH DIFFERENTIAL/PLATELET
BASOS ABS: 0 10*3/uL (ref 0.0–0.2)
Basos: 1 %
EOS (ABSOLUTE): 0 10*3/uL (ref 0.0–0.4)
Eos: 1 %
HEMOGLOBIN: 10.4 g/dL — AB (ref 11.1–15.9)
Hematocrit: 30.5 % — ABNORMAL LOW (ref 34.0–46.6)
IMMATURE GRANS (ABS): 0 10*3/uL (ref 0.0–0.1)
IMMATURE GRANULOCYTES: 0 %
LYMPHS: 24 %
Lymphocytes Absolute: 1 10*3/uL (ref 0.7–3.1)
MCH: 31.7 pg (ref 26.6–33.0)
MCHC: 34.1 g/dL (ref 31.5–35.7)
MCV: 93 fL (ref 79–97)
MONOCYTES: 12 %
Monocytes Absolute: 0.5 10*3/uL (ref 0.1–0.9)
NEUTROS PCT: 62 %
Neutrophils Absolute: 2.7 10*3/uL (ref 1.4–7.0)
PLATELETS: 210 10*3/uL (ref 150–379)
RBC: 3.28 x10E6/uL — AB (ref 3.77–5.28)
RDW: 13.4 % (ref 12.3–15.4)
WBC: 4.3 10*3/uL (ref 3.4–10.8)

## 2015-04-15 LAB — HEPATIC FUNCTION PANEL
ALBUMIN: 4.1 g/dL (ref 3.5–4.7)
ALK PHOS: 70 IU/L (ref 39–117)
ALT: 15 IU/L (ref 0–32)
AST: 30 IU/L (ref 0–40)
BILIRUBIN, DIRECT: 0.12 mg/dL (ref 0.00–0.40)
Bilirubin Total: 0.3 mg/dL (ref 0.0–1.2)
TOTAL PROTEIN: 6.4 g/dL (ref 6.0–8.5)

## 2015-04-15 LAB — LIPID PANEL
CHOL/HDL RATIO: 2.8 ratio (ref 0.0–4.4)
Cholesterol, Total: 230 mg/dL — ABNORMAL HIGH (ref 100–199)
HDL: 82 mg/dL (ref >39)
LDL CALC: 128 mg/dL — AB (ref 0–99)
TRIGLYCERIDES: 99 mg/dL (ref 0–149)
VLDL Cholesterol Cal: 20 mg/dL (ref 5–40)

## 2015-04-15 LAB — THYROID PANEL WITH TSH
Free Thyroxine Index: 0.6 — ABNORMAL LOW (ref 1.2–4.9)
T3 Uptake Ratio: 24 % (ref 24–39)
T4, Total: 2.7 ug/dL — ABNORMAL LOW (ref 4.5–12.0)
TSH: 89.22 u[IU]/mL — ABNORMAL HIGH (ref 0.450–4.500)

## 2015-04-15 LAB — VITAMIN D 25 HYDROXY (VIT D DEFICIENCY, FRACTURES): Vit D, 25-Hydroxy: 23.3 ng/mL — ABNORMAL LOW (ref 30.0–100.0)

## 2015-04-17 ENCOUNTER — Other Ambulatory Visit: Payer: Self-pay | Admitting: *Deleted

## 2015-04-17 MED ORDER — LEVOTHYROXINE SODIUM 50 MCG PO TABS: 50.0000 ug | ORAL_TABLET | Freq: Every day | ORAL | Status: DC

## 2015-05-23 ENCOUNTER — Telehealth: Payer: Self-pay | Admitting: Family Medicine

## 2015-05-26 ENCOUNTER — Other Ambulatory Visit: Payer: Medicare Other

## 2015-05-26 DIAGNOSIS — E875 Hyperkalemia: Secondary | ICD-10-CM

## 2015-05-27 LAB — BMP8+EGFR
BUN/Creatinine Ratio: 16 (ref 11–26)
BUN: 15 mg/dL (ref 8–27)
CO2: 31 mmol/L — ABNORMAL HIGH (ref 18–29)
Calcium: 9.4 mg/dL (ref 8.7–10.3)
Chloride: 95 mmol/L — ABNORMAL LOW (ref 96–106)
Creatinine, Ser: 0.93 mg/dL (ref 0.57–1.00)
GFR calc Af Amer: 63 mL/min/{1.73_m2} (ref >59)
GFR, EST NON AFRICAN AMERICAN: 55 mL/min/{1.73_m2} — AB (ref >59)
GLUCOSE: 86 mg/dL (ref 65–99)
POTASSIUM: 3.8 mmol/L (ref 3.5–5.2)
SODIUM: 138 mmol/L (ref 134–144)

## 2015-05-27 NOTE — Telephone Encounter (Signed)
FYI Patient fell yesterday and broke her hip. They are waiting for her to come off xarelto before they can operate. She is at [facility]. They said she was in [place] when EMS got there.

## 2015-06-09 ENCOUNTER — Ambulatory Visit: Payer: Medicare Other | Admitting: Family Medicine

## 2015-07-07 ENCOUNTER — Telehealth: Payer: Self-pay | Admitting: Family Medicine

## 2015-07-07 MED ORDER — AMOXICILLIN-POT CLAVULANATE 875-125 MG PO TABS: 1.0000 | ORAL_TABLET | Freq: Two times a day (BID) | ORAL | Status: DC

## 2015-07-07 NOTE — Telephone Encounter (Signed)
Ordered and pt aware- by VM

## 2015-07-07 NOTE — Telephone Encounter (Signed)
Please start Augmentin 875 #20 one twice daily with food if she is not allergic to penicillin

## 2015-07-09 ENCOUNTER — Ambulatory Visit (INDEPENDENT_AMBULATORY_CARE_PROVIDER_SITE_OTHER): Payer: Medicare Other | Admitting: Family Medicine

## 2015-07-09 ENCOUNTER — Ambulatory Visit (INDEPENDENT_AMBULATORY_CARE_PROVIDER_SITE_OTHER): Payer: Medicare Other

## 2015-07-09 ENCOUNTER — Encounter: Payer: Self-pay | Admitting: Family Medicine

## 2015-07-09 VITALS — BP 108/62 | HR 69 | Temp 98.6°F | Ht 60.0 in | Wt 134.0 lb

## 2015-07-09 DIAGNOSIS — J40 Bronchitis, not specified as acute or chronic: Secondary | ICD-10-CM

## 2015-07-09 DIAGNOSIS — R413 Other amnesia: Secondary | ICD-10-CM | POA: Diagnosis not present

## 2015-07-09 DIAGNOSIS — E034 Atrophy of thyroid (acquired): Secondary | ICD-10-CM | POA: Diagnosis not present

## 2015-07-09 DIAGNOSIS — E038 Other specified hypothyroidism: Secondary | ICD-10-CM

## 2015-07-09 DIAGNOSIS — R05 Cough: Secondary | ICD-10-CM

## 2015-07-09 DIAGNOSIS — I480 Paroxysmal atrial fibrillation: Secondary | ICD-10-CM

## 2015-07-09 DIAGNOSIS — E785 Hyperlipidemia, unspecified: Secondary | ICD-10-CM | POA: Diagnosis not present

## 2015-07-09 DIAGNOSIS — I509 Heart failure, unspecified: Secondary | ICD-10-CM

## 2015-07-09 DIAGNOSIS — I10 Essential (primary) hypertension: Secondary | ICD-10-CM

## 2015-07-09 DIAGNOSIS — J209 Acute bronchitis, unspecified: Secondary | ICD-10-CM

## 2015-07-09 DIAGNOSIS — R059 Cough, unspecified: Secondary | ICD-10-CM

## 2015-07-09 MED ORDER — CEFDINIR 300 MG PO CAPS: 300.0000 mg | ORAL_CAPSULE | Freq: Two times a day (BID) | ORAL | Status: DC

## 2015-07-09 MED ORDER — METHYLPREDNISOLONE ACETATE 80 MG/ML IJ SUSP
60.0000 mg | Freq: Once | INTRAMUSCULAR | Status: AC
  Administered 2015-07-09: 60 mg via INTRAMUSCULAR

## 2015-07-09 MED ORDER — PREDNISONE 10 MG PO TABS: ORAL_TABLET | ORAL | Status: DC

## 2015-07-09 NOTE — Patient Instructions (Signed)
The patient should continue to drink plenty of fluids and stay well hydrated She should take an arthritis strength Tylenol before her physical therapy only 1 She should take Mucinex maximum strength, plain, blue and white in color, 1 twice daily with a large glass of water for cough and congestion She should continue to use her Symbicort 2 puffs twice daily and rinse mouth after using She should use the albuterol inhaler as a rescue inhaler as needed for wheezing and shortness of breath She should take the antibiotic as directed and until completed She should take the prednisone as directed and until completed Keep the house as cool as possible

## 2015-07-09 NOTE — Progress Notes (Signed)
Subjective:    Patient ID: [name], female    DOB: 1925-02-20, 82 y.o.   MRN: [id number]  HPI Patient here today for hospital and Nursing center follow up. She has been home about 1 week. The patient comes to the visit today with her caregiver. She is using a walker. She is still getting physical therapy. She was in the nursing center for about 40 days. She has a history of congestive heart failure atrial fibrillation hypothyroidism hyperlipidemia hypertension and osteoarthritis along with memory disorder. The patient comes to the visit today as mentioned earlier with her walker. Her caregiver is fully in 10 to the patient and says that she is eating well and doing well other than a cough that she is developed since the weekend. She has not been running any fever. She is not coughing up any sputum. There is a family history of wheezing and asthma. She denies any chest pain trouble swallowing heartburn indigestion nausea vomiting or blood in the stool. She has had a few loose bowel movements and they've been given her some MiraLAX and we'll reduce this every other day to see if this makes a difference. She is passing her water without problems.     Patient Active Problem List   Diagnosis Date Noted  . Hypothyroidism 03/31/2015  . CHF (congestive heart failure) ([place]) 11/15/2014  . Absolute anemia 10/18/2014  . Bruising, spontaneous 09/16/2014  . Long term current use of anticoagulant 09/16/2014  . Atrial fibrillation ([place]) 04/15/2014  . Edema 04/15/2014  . Nodule of right lung 05/27/2013  . Memory impairment 03/12/2013  . Prophylactic immunotherapy 10/24/2012  . Dyspnea 07/06/2012  . Extrinsic asthma 08/31/2010  . Atrophic vaginitis   . Malaise and fatigue   . Thoracic scoliosis   . Insomnia, idiopathic   . Gastritis   . Gastroesophageal reflux disease with hiatal hernia   . Colon polyps   . Diverticulosis   . Osteoarthrosis and allied disorders   . Hyperlipidemia  02/11/2009  . Essential hypertension 02/10/2009   Outpatient Encounter Prescriptions as of 07/09/2015  Medication Sig  . albuterol (PROAIR HFA) 108 (90 BASE) MCG/ACT inhaler Inhale 2 puffs into the lungs every 6 (six) hours as needed.  Marland Kitchen albuterol (PROVENTIL) (2.5 MG/3ML) 0.083% nebulizer solution Take 3 mLs (2.5 mg total) by nebulization every 6 (six) hours as needed for wheezing or shortness of breath.  Marland Kitchen amiodarone (PACERONE) 200 MG tablet Take 0.5 tablets (100 mg total) by mouth daily.  Marland Kitchen amLODipine (NORVASC) 10 MG tablet Take 10 mg by mouth daily.  Marland Kitchen amoxicillin-clavulanate (AUGMENTIN) 875-125 MG tablet Take 1 tablet by mouth 2 (two) times daily.  . budesonide-formoterol (SYMBICORT) 80-4.5 MCG/ACT inhaler Inhale 2 puffs into the lungs 2 (two) times daily.  . busPIRone (BUSPAR) 5 MG tablet Take 1 tablet (5 mg total) by mouth at bedtime.  . calcium carbonate (OS-CAL) 1250 (500 Ca) MG chewable tablet Chew 1 tablet by mouth daily.  . cholecalciferol (VITAMIN D) 1000 units tablet Take 1,000 Units by mouth daily.  Marland Kitchen docusate sodium (COLACE) 100 MG capsule Take 100 mg by mouth 2 (two) times daily.  Marland Kitchen donepezil (ARICEPT) 10 MG tablet Take 1 tablet (10 mg total) by mouth daily.  . furosemide (LASIX) 20 MG tablet TAKE  (1)  TABLET A DAY AS DIRECTED.  Marland Kitchen levothyroxine (SYNTHROID, LEVOTHROID) 50 MCG tablet Take 1 tablet (50 mcg total) by mouth daily.  Marland Kitchen losartan (COZAAR) 100 MG tablet Take 1 tablet (100 mg total)  by mouth daily.  . memantine (NAMENDA) 10 MG tablet Take 1 tablet (10 mg total) by mouth 2 (two) times daily.  . metoprolol succinate (TOPROL-XL) 25 MG 24 hr tablet Take 1 tablet (25 mg total) by mouth daily.  . Multiple Vitamin (MULTIVITAMIN WITH MINERALS) TABS tablet Take 1 tablet by mouth daily.  Marland Kitchen omeprazole (PRILOSEC) 20 MG capsule TAKE (1) CAPSULE TWICE DAILY.  Marland Kitchen potassium chloride SA (K-DUR,KLOR-CON) 20 MEQ tablet Take 20 mEq by mouth daily.  . Rivaroxaban (XARELTO) 15 MG TABS tablet  TAKE (1) TABLET DAILY WITH SUPPER.   No facility-administered encounter medications on file as of 07/09/2015.      Review of Systems  Constitutional: Negative.  Negative for fever.  HENT: Positive for congestion.   Eyes: Negative.   Respiratory: Positive for cough.   Cardiovascular: Negative.   Gastrointestinal: Negative.   Endocrine: Negative.   Genitourinary: Negative.   Musculoskeletal: Positive for arthralgias (right hip pain).  Skin: Negative.   Allergic/Immunologic: Negative.   Neurological: Negative.   Hematological: Negative.   Psychiatric/Behavioral: Negative.        Objective:   Physical Exam  Constitutional: She is oriented to person, place, and time. She appears well-developed and well-nourished. No distress.  HENT:  Head: Normocephalic and atraumatic.  Right Ear: External ear normal.  Left Ear: External ear normal.  Nose: Nose normal.  Mouth/Throat: Oropharynx is clear and moist.  Eyes: Conjunctivae and EOM are normal. Pupils are equal, round, and reactive to light. Right eye exhibits no discharge. Left eye exhibits no discharge. No scleral icterus.  Neck: Normal range of motion. Neck supple. No JVD present. No thyromegaly present.  Cardiovascular: Normal rate, regular rhythm and normal heart sounds.  Exam reveals no friction rub.   No murmur heard. Pulmonary/Chest: Effort normal and breath sounds normal. No respiratory distress. She has no wheezes. She has no rales. She exhibits no tenderness.  Inspiratory and expiratory wheezes and no rales were audible. The patient had a raspy cough also. The cough was not productive of sputum.  Musculoskeletal: She exhibits no edema or tenderness.  The strength was good in the lower extremity bilaterally with some discomfort with raising her thigh otherwise good strength.  Lymphadenopathy:    She has no cervical adenopathy.  Neurological: She is alert and oriented to person, place, and time.  The patient was appropriate in  her manner and responded appropriately to questions asked of her. She is usually a noncomplainer.  Skin: Skin is warm and dry. No rash noted.  Psychiatric: She has a normal mood and affect. Her behavior is normal. Judgment and thought content normal.  Nursing note and vitals reviewed.  BP 108/62 mmHg  Pulse 69  Temp(Src) 98.6 F (37 C) (Oral)  Ht 5' (1.524 m)  Wt 134 lb (60.782 kg)  BMI 26.17 kg/m2  CBC and BMP are pending  [facility] reading (PRIMARY) by  Dr.Malayiah Mcbrayer-chest x-ray results pending                                        Assessment & Plan:  1. Memory impairment -This appears to be stable she will continue with her medications for this.  2. Hypothyroidism due to acquired atrophy of thyroid -Continue current thyroid medication  3. Hyperlipidemia -Continue with diet  4. Essential hypertension -Blood pressure is good today continue with current treatment  5. Congestive heart failure, unspecified congestive  heart failure chronicity, unspecified congestive heart failure type (HCC) -No rales on auscultation we are getting a chest x-ray  6. Paroxysmal atrial fibrillation (HCC) -The heart was regular today at about 72/m.  7. Cough -Take Mucinex twice daily with a large glass of water - DG Chest 2 View; Future - BMP8+EGFR - CBC with Differential/Platelet  8. Bronchitis with bronchospasm -Take antibiotic as directed and continue with Symbicort and only use albuterol as needed -Take prednisone as directed -We will call with results of x-rays as that becomes available and CBC that becomes available  Meds ordered this encounter  Medications  . calcium carbonate (OS-CAL) 1250 (500 Ca) MG chewable tablet    Sig: Chew 1 tablet by mouth daily.  . Multiple Vitamin (MULTIVITAMIN WITH MINERALS) TABS tablet    Sig: Take 1 tablet by mouth daily.  Marland Kitchen docusate sodium (COLACE) 100 MG capsule    Sig: Take 100 mg by mouth 2 (two) times daily.  . cholecalciferol (VITAMIN D) 1000  units tablet    Sig: Take 1,000 Units by mouth daily.  Marland Kitchen amLODipine (NORVASC) 10 MG tablet    Sig: Take 10 mg by mouth daily.  . potassium chloride SA (K-DUR,KLOR-CON) 20 MEQ tablet    Sig: Take 20 mEq by mouth daily.  . predniSONE (DELTASONE) 10 MG tablet    Sig: Take 1 tab QID x 2 days, then 1 tab TID x 2 days, then 1 tab BID x 2 days, then 1 tab QD x 2 days, then stop    Dispense:  20 tablet    Refill:  0  . cefdinir (OMNICEF) 300 MG capsule    Sig: Take 1 capsule (300 mg total) by mouth 2 (two) times daily. 1 po BID    Dispense:  20 capsule    Refill:  0  . methylPREDNISolone acetate (DEPO-MEDROL) injection 60 mg    Sig:      Patient Instructions  The patient should continue to drink plenty of fluids and stay well hydrated She should take an arthritis strength Tylenol before her physical therapy only 1 She should take Mucinex maximum strength, plain, blue and white in color, 1 twice daily with a large glass of water for cough and congestion She should continue to use her Symbicort 2 puffs twice daily and rinse mouth after using She should use the albuterol inhaler as a rescue inhaler as needed for wheezing and shortness of breath She should take the antibiotic as directed and until completed She should take the prednisone as directed and until completed Keep the house as cool as possible   Arrie Senate MD

## 2015-07-10 ENCOUNTER — Telehealth: Payer: Self-pay | Admitting: Family Medicine

## 2015-07-10 LAB — CBC WITH DIFFERENTIAL/PLATELET
Basophils Absolute: 0 10*3/uL (ref 0.0–0.2)
Basos: 0 %
EOS (ABSOLUTE): 0.1 10*3/uL (ref 0.0–0.4)
EOS: 0 %
HEMATOCRIT: 33.3 % — AB (ref 34.0–46.6)
Hemoglobin: 10.7 g/dL — ABNORMAL LOW (ref 11.1–15.9)
Immature Grans (Abs): 0 10*3/uL (ref 0.0–0.1)
Immature Granulocytes: 0 %
LYMPHS ABS: 0.8 10*3/uL (ref 0.7–3.1)
Lymphs: 7 %
MCH: 29.7 pg (ref 26.6–33.0)
MCHC: 32.1 g/dL (ref 31.5–35.7)
MCV: 93 fL (ref 79–97)
MONOS ABS: 1 10*3/uL — AB (ref 0.1–0.9)
Monocytes: 8 %
Neutrophils Absolute: 10.3 10*3/uL — ABNORMAL HIGH (ref 1.4–7.0)
Neutrophils: 85 %
Platelets: 272 10*3/uL (ref 150–379)
RBC: 3.6 x10E6/uL — AB (ref 3.77–5.28)
RDW: 16 % — AB (ref 12.3–15.4)
WBC: 12.2 10*3/uL — AB (ref 3.4–10.8)

## 2015-07-10 LAB — BMP8+EGFR
BUN / CREAT RATIO: 16 (ref 11–26)
BUN: 15 mg/dL (ref 8–27)
CHLORIDE: 90 mmol/L — AB (ref 96–106)
CO2: 26 mmol/L (ref 18–29)
CREATININE: 0.91 mg/dL (ref 0.57–1.00)
Calcium: 9 mg/dL (ref 8.7–10.3)
GFR calc Af Amer: 65 mL/min/{1.73_m2} (ref >59)
GFR calc non Af Amer: 57 mL/min/{1.73_m2} — ABNORMAL LOW (ref >59)
GLUCOSE: 99 mg/dL (ref 65–99)
Potassium: 3.8 mmol/L (ref 3.5–5.2)
SODIUM: 132 mmol/L — AB (ref 134–144)

## 2015-07-10 NOTE — Telephone Encounter (Signed)
Spoke to pt's niece and advised to take Arthritis strength tylenol 1 hour prior to pt's PT per [facility] last office visit note. Pt's niece voiced understanding.

## 2015-07-11 ENCOUNTER — Telehealth: Payer: Self-pay | Admitting: Family Medicine

## 2015-07-16 NOTE — Telephone Encounter (Signed)
Patients niece aware of results.

## 2015-07-17 ENCOUNTER — Ambulatory Visit: Payer: Medicare Other | Admitting: Family Medicine

## 2015-07-18 ENCOUNTER — Encounter: Payer: Self-pay | Admitting: Family Medicine

## 2015-07-18 ENCOUNTER — Ambulatory Visit (INDEPENDENT_AMBULATORY_CARE_PROVIDER_SITE_OTHER): Payer: Medicare Other | Admitting: Family Medicine

## 2015-07-18 VITALS — BP 140/67 | HR 66 | Temp 98.5°F | Ht 60.0 in | Wt 132.0 lb

## 2015-07-18 DIAGNOSIS — R05 Cough: Secondary | ICD-10-CM | POA: Diagnosis not present

## 2015-07-18 DIAGNOSIS — J40 Bronchitis, not specified as acute or chronic: Secondary | ICD-10-CM | POA: Diagnosis not present

## 2015-07-18 DIAGNOSIS — J209 Acute bronchitis, unspecified: Secondary | ICD-10-CM

## 2015-07-18 DIAGNOSIS — R059 Cough, unspecified: Secondary | ICD-10-CM

## 2015-07-18 MED ORDER — BENZONATATE 100 MG PO CAPS: 100.0000 mg | ORAL_CAPSULE | Freq: Three times a day (TID) | ORAL | Status: DC | PRN

## 2015-07-18 NOTE — Progress Notes (Signed)
Subjective:    Patient ID: [name], female    DOB: 1925-04-21, 82 y.o.   MRN: [id number]  HPI Patient here today for 8 day follow up on cough and bronchitis. She is still having a lot of coughing.This is a follow-up on a recent visit for cough and bronchitis and the patient has 1 more day of Augmentin until her full course is completed. The cough is some better but she still has a lot of coughing. The caregiver mentions that she keeps the temperature set at 84 in the house. She is not running any fever. She is not using a cool mist humidifier. The cough is nonproductive. She is using her inhalers regularly. Her caregiver  is giving her plenty of fluids.     Patient Active Problem List   Diagnosis Date Noted  . Hypothyroidism 03/31/2015  . CHF (congestive heart failure) (H[facility]) 11/15/2014  . Absolute anemia 10/18/2014  . Bruising, spontaneous 09/16/2014  . Long term current use of anticoagulant 09/16/2014  . Atrial fibrillation (H[facility]) 04/15/2014  . Edema 04/15/2014  . Nodule of right lung 05/27/2013  . Memory impairment 03/12/2013  . Prophylactic immunotherapy 10/24/2012  . Dyspnea 07/06/2012  . Extrinsic asthma 08/31/2010  . Atrophic vaginitis   . Malaise and fatigue   . Thoracic scoliosis   . Insomnia, idiopathic   . Gastritis   . Gastroesophageal reflux disease with hiatal hernia   . Colon polyps   . Diverticulosis   . Osteoarthrosis and allied disorders   . Hyperlipidemia 02/11/2009  . Essential hypertension 02/10/2009   Outpatient Encounter Prescriptions as of 07/18/2015  Medication Sig  . albuterol (PROAIR HFA) 108 (90 BASE) MCG/ACT inhaler Inhale 2 puffs into the lungs every 6 (six) hours as needed.  Marland Kitchen. albuterol (PROVENTIL) (2.5 MG/3ML) 0.083% nebulizer solution Take 3 mLs (2.5 mg total) by nebulization every 6 (six) hours as needed for wheezing or shortness of breath.  Marland Kitchen. amiodarone (PACERONE) 200 MG tablet Take 0.5 tablets (100 mg total) by mouth daily.  Marland Kitchen.  amLODipine (NORVASC) 10 MG tablet Take 10 mg by mouth daily.  Marland Kitchen. amoxicillin-clavulanate (AUGMENTIN) 875-125 MG tablet Take 1 tablet by mouth 2 (two) times daily.  . budesonide-formoterol (SYMBICORT) 80-4.5 MCG/ACT inhaler Inhale 2 puffs into the lungs 2 (two) times daily.  . busPIRone (BUSPAR) 5 MG tablet Take 1 tablet (5 mg total) by mouth at bedtime.  . calcium carbonate (OS-CAL) 1250 (500 Ca) MG chewable tablet Chew 1 tablet by mouth daily.  . cefdinir (OMNICEF) 300 MG capsule Take 1 capsule (300 mg total) by mouth 2 (two) times daily. 1 po BID  . cholecalciferol (VITAMIN D) 1000 units tablet Take 1,000 Units by mouth daily.  Marland Kitchen. docusate sodium (COLACE) 100 MG capsule Take 100 mg by mouth 2 (two) times daily.  Marland Kitchen. donepezil (ARICEPT) 10 MG tablet Take 1 tablet (10 mg total) by mouth daily.  . furosemide (LASIX) 20 MG tablet TAKE  (1)  TABLET A DAY AS DIRECTED.  Marland Kitchen. levothyroxine (SYNTHROID, LEVOTHROID) 50 MCG tablet Take 1 tablet (50 mcg total) by mouth daily.  Marland Kitchen. losartan (COZAAR) 100 MG tablet Take 1 tablet (100 mg total) by mouth daily.  . memantine (NAMENDA) 10 MG tablet Take 1 tablet (10 mg total) by mouth 2 (two) times daily.  . metoprolol succinate (TOPROL-XL) 25 MG 24 hr tablet Take 1 tablet (25 mg total) by mouth daily.  . Multiple Vitamin (MULTIVITAMIN WITH MINERALS) TABS tablet Take 1 tablet by mouth daily.  .Marland Kitchen  omeprazole (PRILOSEC) 20 MG capsule TAKE (1) CAPSULE TWICE DAILY.  Marland Kitchen potassium chloride SA (K-DUR,KLOR-CON) 20 MEQ tablet Take 20 mEq by mouth daily.  . predniSONE (DELTASONE) 10 MG tablet Take 1 tab QID x 2 days, then 1 tab TID x 2 days, then 1 tab BID x 2 days, then 1 tab QD x 2 days, then stop  . Rivaroxaban (XARELTO) 15 MG TABS tablet TAKE (1) TABLET DAILY WITH SUPPER.   No facility-administered encounter medications on file as of 07/18/2015.      Review of Systems  Constitutional: Negative.   HENT: Negative.   Eyes: Negative.   Respiratory: Positive for cough.     Cardiovascular: Negative.   Gastrointestinal: Negative.   Endocrine: Negative.   Genitourinary: Negative.   Musculoskeletal: Negative.   Skin: Negative.   Allergic/Immunologic: Negative.   Neurological: Negative.   Hematological: Negative.   Psychiatric/Behavioral: Negative.        Objective:   Physical Exam  Constitutional: She is oriented to person, place, and time. She appears well-nourished. No distress.  HENT:  Head: Normocephalic and atraumatic.  Right Ear: External ear normal.  Left Ear: External ear normal.  Nose: Nose normal.  Mouth/Throat: Oropharynx is clear and moist. No oropharyngeal exudate.  Eyes: Conjunctivae and EOM are normal. Pupils are equal, round, and reactive to light. Right eye exhibits no discharge. Left eye exhibits no discharge.  Neck: Normal range of motion. Neck supple. No thyromegaly present.  Cardiovascular: Normal rate, regular rhythm and normal heart sounds.   No murmur heard. Pulmonary/Chest: Effort normal. No respiratory distress. She has wheezes. She has no rales.  The patient has a tight dry cough without rales and a few sparse wheezes.  Musculoskeletal: She exhibits no edema or tenderness.  With a cane  Neurological: She is alert and oriented to person, place, and time.  Skin: Skin is warm and dry. No rash noted.  Psychiatric: She has a normal mood and affect. Her behavior is normal. Thought content normal.  Nursing note and vitals reviewed.  BP 140/67 mmHg  Pulse 66  Temp(Src) 98.5 F (36.9 C) (Oral)  Ht 5' (1.524 m)  Wt 132 lb (59.875 kg)  BMI 25.78 kg/m2        Assessment & Plan:  1. Bronchitis with bronchospasm -Continue with Mucinex and we will add Tessalon Perles to the treatment regimen -Continue to drink plenty of fluids and stay well hydrated -Keep the house as cool as possible -Finish the antibiotic -Reduce the thermostat to 72  2. Cough -Take Mucinex and Tessalon Perles -If no better in one week we will  arrange for her to see a pulmonologist.  Patient Instructions  Use the Tessalon Perles for cough if needed Keep house cool - around 72 deg would be great Push fluids Call us in 6 days - if no better  -- we will arrange appt with Pulmonary Dr  Use a cool mist humidifier   Nyra Capes MD

## 2015-07-18 NOTE — Patient Instructions (Addendum)
Use the Tessalon Perles for cough if needed Keep house cool - around 72 deg would be great Push fluids Call us in 6 days - if no better  -- we will arrange appt with Pulmonary Dr  Use a cool mist humidifier

## 2015-08-14 ENCOUNTER — Other Ambulatory Visit: Payer: Self-pay | Admitting: Family Medicine

## 2015-08-15 NOTE — Telephone Encounter (Signed)
See last thyroid labs

## 2015-09-05 ENCOUNTER — Other Ambulatory Visit: Payer: Self-pay | Admitting: Family Medicine

## 2015-09-23 ENCOUNTER — Ambulatory Visit (INDEPENDENT_AMBULATORY_CARE_PROVIDER_SITE_OTHER): Payer: Medicare Other

## 2015-09-23 ENCOUNTER — Ambulatory Visit (INDEPENDENT_AMBULATORY_CARE_PROVIDER_SITE_OTHER): Payer: Medicare Other | Admitting: Family

## 2015-09-23 ENCOUNTER — Encounter: Payer: Self-pay | Admitting: Family

## 2015-09-23 VITALS — BP 132/87 | HR 64 | Temp 98.8°F | Ht 60.0 in | Wt 130.0 lb

## 2015-09-23 DIAGNOSIS — M25512 Pain in left shoulder: Secondary | ICD-10-CM | POA: Diagnosis not present

## 2015-09-23 MED ORDER — PREDNISONE 10 MG (21) PO TBPK: 10.0000 mg | ORAL_TABLET | Freq: Every day | ORAL | Status: DC

## 2015-09-23 NOTE — Patient Instructions (Signed)

## 2015-09-23 NOTE — Progress Notes (Signed)
   Subjective:    Patient ID: [name], female    DOB: 08-14-1924, 82 y.o.   MRN: [id number]  Shoulder Pain  The pain is present in the left shoulder, left hand and left arm. This is a new problem. The current episode started 1 to 4 weeks ago. There has been no history of extremity trauma. The problem has been unchanged. The quality of the pain is described as aching. The pain is at a severity of 3/10. The pain is moderate. Associated symptoms include numbness and tingling. Pertinent negatives include no inability to bear weight, itching, joint swelling or stiffness. The symptoms are aggravated by activity. The treatment provided mild relief.      Review of Systems  Musculoskeletal: Negative for stiffness.  Skin: Negative for itching.  Neurological: Positive for tingling and numbness.  All other systems reviewed and are negative.      Objective:   Physical Exam  Constitutional: She is oriented to person, place, and time. She appears well-developed and well-nourished. No distress.  Cardiovascular: Normal rate, regular rhythm, normal heart sounds and intact distal pulses.   No murmur heard. Pulmonary/Chest: Effort normal and breath sounds normal. No respiratory distress. She has no wheezes.  Abdominal: Soft. Bowel sounds are normal. She exhibits no distension. There is no tenderness.  Musculoskeletal: Normal range of motion. She exhibits no edema or tenderness.  Decreased ROM, unable to lift arm greater than 45 degrees  Neurological: She is alert and oriented to person, place, and time.  Skin: Skin is warm and dry.  Psychiatric: She has a normal mood and affect. Her behavior is normal. Judgment and thought content normal.  Vitals reviewed.    BP 132/87 mmHg  Pulse 64  Temp(Src) 98.8 F (37.1 C) (Oral)  Ht 5' (1.524 m)  Wt 130 lb (58.968 kg)  BMI 25.39 kg/m2  Left shoulder- Hardware in place, no fracture noted, Preliminary reading by Jannifer Rodneyhristy Bao Coreas, FNP [id number]       Assessment & Plan:  1. Left shoulder pain -Rest -Ice as needed -Keep appt with ortho  -RTO prn  - DG Shoulder Left; Future - predniSONE (STERAPRED UNI-PAK 21 TAB) 10 MG (21) TBPK tablet; Take 1 tablet (10 mg total) by mouth daily. As directed x 6 days  Dispense: 21 tablet; Refill: 0  Jannifer Rodneyhristy Donalyn Schneeberger, FNP

## 2015-09-29 ENCOUNTER — Encounter: Payer: Self-pay | Admitting: Family Medicine

## 2015-09-29 ENCOUNTER — Ambulatory Visit (INDEPENDENT_AMBULATORY_CARE_PROVIDER_SITE_OTHER): Payer: Medicare Other

## 2015-09-29 ENCOUNTER — Ambulatory Visit (INDEPENDENT_AMBULATORY_CARE_PROVIDER_SITE_OTHER): Payer: Medicare Other | Admitting: Family Medicine

## 2015-09-29 ENCOUNTER — Other Ambulatory Visit: Payer: Self-pay | Admitting: Family Medicine

## 2015-09-29 VITALS — BP 111/64 | HR 66 | Temp 98.6°F | Ht 60.0 in | Wt 132.0 lb

## 2015-09-29 DIAGNOSIS — E785 Hyperlipidemia, unspecified: Secondary | ICD-10-CM | POA: Diagnosis not present

## 2015-09-29 DIAGNOSIS — E034 Atrophy of thyroid (acquired): Secondary | ICD-10-CM

## 2015-09-29 DIAGNOSIS — E559 Vitamin D deficiency, unspecified: Secondary | ICD-10-CM

## 2015-09-29 DIAGNOSIS — M25511 Pain in right shoulder: Secondary | ICD-10-CM

## 2015-09-29 DIAGNOSIS — M25512 Pain in left shoulder: Secondary | ICD-10-CM | POA: Diagnosis not present

## 2015-09-29 DIAGNOSIS — E038 Other specified hypothyroidism: Secondary | ICD-10-CM

## 2015-09-29 DIAGNOSIS — I10 Essential (primary) hypertension: Secondary | ICD-10-CM

## 2015-09-29 DIAGNOSIS — R413 Other amnesia: Secondary | ICD-10-CM | POA: Diagnosis not present

## 2015-09-29 DIAGNOSIS — R208 Other disturbances of skin sensation: Secondary | ICD-10-CM | POA: Diagnosis not present

## 2015-09-29 DIAGNOSIS — R200 Anesthesia of skin: Secondary | ICD-10-CM

## 2015-09-29 DIAGNOSIS — I480 Paroxysmal atrial fibrillation: Secondary | ICD-10-CM

## 2015-09-29 DIAGNOSIS — K219 Gastro-esophageal reflux disease without esophagitis: Secondary | ICD-10-CM | POA: Diagnosis not present

## 2015-09-29 NOTE — Progress Notes (Signed)
Subjective:    Patient ID: [name], female    DOB: [birth year], 82 y.o.   MRN: [id number]  HPI Pt here for follow up and management of chronic medical problems which includes hyperlipidemia, hypothyroid and hypertension. She is taking medications regularly.The patient comes to the visit today with her niece. She is still having some left shoulder pain and has been seeing Dr. case and has had shots in both shoulders. She is having some numbness in her fingers on the left side. She has had previous neck surgery. She will get lab work today and will be given an FOBT to return. Her blood pressure was elevated at home when she awakened but it is good here in the office today as 111/64. Her weight is stable. The patient has had a couple of injections in her shoulders but she still has numbness and tingling in the first 3 fingers of the left hand. She has had previous neck surgery as mentioned. We will get C-spine films today. With the numbness in the fingers of the left hand we will ask her to wear a wrist brace at nighttime. After 1 month if there's no improvement the niece will call us back and we will consider getting carpal tunnel test to further evaluate the numbness. The patient denies any chest pain or shortness of breath anymore than usual. She's not having any trouble with swallowing her food heartburn indigestion nausea vomiting diarrhea blood in the stool or black tarry bowel movements. She is not having any problems passing her water. Her mental status appears unchanged as far as her memory is concerned.      Patient Active Problem List   Diagnosis Date Noted  . Hypothyroidism 03/31/2015  . CHF (congestive heart failure) ([place]) 11/15/2014  . Absolute anemia 10/18/2014  . Bruising, spontaneous 09/16/2014  . Long term current use of anticoagulant 09/16/2014  . Atrial fibrillation ([place]) 04/15/2014  . Edema 04/15/2014  . Nodule of right lung 05/27/2013  . Memory impairment 03/12/2013   . Prophylactic immunotherapy 10/24/2012  . Dyspnea 07/06/2012  . Extrinsic asthma 08/31/2010  . Atrophic vaginitis   . Malaise and fatigue   . Thoracic scoliosis   . Insomnia, idiopathic   . Gastritis   . Gastroesophageal reflux disease with hiatal hernia   . Colon polyps   . Diverticulosis   . Osteoarthrosis and allied disorders   . Hyperlipidemia 02/11/2009  . Essential hypertension 02/10/2009   Outpatient Encounter Prescriptions as of 09/29/2015  Medication Sig  . albuterol (PROAIR HFA) 108 (90 BASE) MCG/ACT inhaler Inhale 2 puffs into the lungs every 6 (six) hours as needed.  Marland Kitchen amiodarone (PACERONE) 200 MG tablet Take 0.5 tablets (100 mg total) by mouth daily.  Marland Kitchen amLODipine (NORVASC) 10 MG tablet Take 10 mg by mouth daily.  . budesonide-formoterol (SYMBICORT) 80-4.5 MCG/ACT inhaler Inhale 2 puffs into the lungs 2 (two) times daily.  . busPIRone (BUSPAR) 5 MG tablet TAKE ONE TABLET AT BEDTIME  . donepezil (ARICEPT) 10 MG tablet Take 1 tablet (10 mg total) by mouth daily.  . furosemide (LASIX) 20 MG tablet TAKE  (1)  TABLET A DAY AS DIRECTED.  Marland Kitchen levothyroxine (SYNTHROID, LEVOTHROID) 50 MCG tablet TAKE 1 TABLET DAILY  . losartan (COZAAR) 100 MG tablet Take 1 tablet (100 mg total) by mouth daily.  . memantine (NAMENDA) 10 MG tablet Take 1 tablet (10 mg total) by mouth 2 (two) times daily.  . metoprolol succinate (TOPROL-XL) 25 MG 24 hr tablet Take  1 tablet (25 mg total) by mouth daily.  . Multiple Vitamin (MULTIVITAMIN WITH MINERALS) TABS tablet Take 1 tablet by mouth daily.  Marland Kitchen omeprazole (PRILOSEC) 20 MG capsule TAKE (1) CAPSULE TWICE DAILY.  Marland Kitchen potassium chloride SA (K-DUR,KLOR-CON) 20 MEQ tablet Take 20 mEq by mouth daily.  . Rivaroxaban (XARELTO) 15 MG TABS tablet TAKE (1) TABLET DAILY WITH SUPPER.  . [DISCONTINUED] albuterol (PROVENTIL) (2.5 MG/3ML) 0.083% nebulizer solution Take 3 mLs (2.5 mg total) by nebulization every 6 (six) hours as needed for wheezing or shortness of  breath.  . [DISCONTINUED] benzonatate (TESSALON) 100 MG capsule Take 1 capsule (100 mg total) by mouth 3 (three) times daily as needed for cough.  . [DISCONTINUED] calcium carbonate (OS-CAL) 1250 (500 Ca) MG chewable tablet Chew 1 tablet by mouth daily.  . [DISCONTINUED] cefdinir (OMNICEF) 300 MG capsule Take 1 capsule (300 mg total) by mouth 2 (two) times daily. 1 po BID  . [DISCONTINUED] cholecalciferol (VITAMIN D) 1000 units tablet Take 1,000 Units by mouth daily.  . [DISCONTINUED] docusate sodium (COLACE) 100 MG capsule Take 100 mg by mouth 2 (two) times daily.  . [DISCONTINUED] predniSONE (STERAPRED UNI-PAK 21 TAB) 10 MG (21) TBPK tablet Take 1 tablet (10 mg total) by mouth daily. As directed x 6 days   No facility-administered encounter medications on file as of 09/29/2015.      Review of Systems  Constitutional: Negative.   HENT: Negative.   Eyes: Negative.   Respiratory: Negative.   Cardiovascular: Negative.   Gastrointestinal: Negative.   Endocrine: Negative.   Genitourinary: Negative.   Musculoskeletal: Positive for arthralgias (bilateral shoulder pain - left worse).  Skin: Negative.   Allergic/Immunologic: Negative.   Neurological: Positive for numbness (left hand / fingers).  Hematological: Negative.   Psychiatric/Behavioral: Negative.        Objective:   Physical Exam  Constitutional: She is oriented to person, place, and time. She appears well-developed and well-nourished. No distress.  Pleasant and alert  HENT:  Head: Normocephalic and atraumatic.  Right Ear: External ear normal.  Left Ear: External ear normal.  Nose: Nose normal.  Mouth/Throat: Oropharynx is clear and moist.  Eyes: Conjunctivae and EOM are normal. Pupils are equal, round, and reactive to light. Right eye exhibits no discharge. Left eye exhibits no discharge. No scleral icterus.  Neck: Normal range of motion. Neck supple. No thyromegaly present.  No bruits or thyromegaly  Cardiovascular: Normal  rate, regular rhythm and normal heart sounds.  Exam reveals no friction rub.   No murmur heard. The heart has a regular rate and rhythm at 72/m  Pulmonary/Chest: Effort normal and breath sounds normal. No respiratory distress. She has no wheezes. She has no rales. She exhibits no tenderness.  Clear anteriorly and posteriorly  Abdominal: Soft. Bowel sounds are normal. She exhibits no mass. There is no tenderness. There is no rebound and no guarding.  No abdominal tenderness masses or organ enlargement or suprapubic tenderness.  Musculoskeletal: Normal range of motion. She exhibits no edema or tenderness.  The patient has fairly good mobility of the neck and arms.  Lymphadenopathy:    She has no cervical adenopathy.  Neurological: She is alert and oriented to person, place, and time. She has normal reflexes. No cranial nerve deficit.  The reflexes in the upper extremities were diminished bilaterally.  Skin: Skin is warm and dry. No rash noted.  Psychiatric: She has a normal mood and affect. Her behavior is normal. Judgment and thought content normal.  Nursing  note and vitals reviewed.  BP 111/64 mmHg  Pulse 66  Temp(Src) 98.6 F (37 C) (Oral)  Ht 5' (1.524 m)  Wt 132 lb (59.875 kg)  BMI 25.78 kg/m2        Assessment & Plan:  1. Hypothyroidism due to acquired atrophy of thyroid -Continue current treatment pending results of lab work - CBC with Differential/Platelet - Thyroid Panel With TSH  2. Essential hypertension -The blood pressure is good today in the office and will be no change in treatment - BMP8+EGFR - CBC with Differential/Platelet - Hepatic function panel  3. Hyperlipidemia -The patient has been statin intolerant in the past and she will continue with aggressive therapeutic lifestyle changes as possible. - CBC with Differential/Platelet - Lipid panel  4. Paroxysmal atrial fibrillation (HCC) -The rhythm is regular today and she will continue with her blood  thinner and her current medication. -She will continue to follow-up with Dr. Hamilton Capri - CBC with Differential/Platelet  5. Vitamin D deficiency -Continue current treatment pending results of lab work - CBC with Differential/Platelet - VITAMIN D 25 Hydroxy (Vit-D Deficiency, Fractures)  6. Gastroesophageal reflux disease, esophagitis presence not specified -No problems today with reflux and she will continue with omeprazole - CBC with Differential/Platelet  7. Memory impairment -Continue with Aricept and Namenda  8. Numbness of left hand -Wear wrist brace nightly on the left hand for one month and if numbness continues in the first 3 fingers, the niece will call us back and we will discuss whether we should do a referral nerve conduction studies. - DG Cervical Spine Complete; Future  9. Bilateral shoulder pain -Follow-up with orthopedist as needed - DG Cervical Spine Complete; Future  Patient Instructions                       Medicare Annual Wellness Visit  [facility] and the medical providers at [facility] strive to bring you the best medical care.  In doing so we not only want to address your current medical conditions and concerns but also to detect new conditions early and prevent illness, disease and health-related problems.    Medicare offers a yearly Wellness Visit which allows our clinical staff to assess your need for preventative services including immunizations, lifestyle education, counseling to decrease risk of preventable diseases and screening for fall risk and other medical concerns.    This visit is provided free of charge (no copay) for all Medicare recipients. The clinical pharmacists at [facility] have begun to conduct these Wellness Visits which will also include a thorough review of all your medications.    As you primary medical provider recommend that you make an appointment for your Annual Wellness Visit if  you have not done so already this year.  You may set up this appointment before you leave today or you may call back ([phone]) and schedule an appointment.  Please make sure when you call that you mention that you are scheduling your Annual Wellness Visit with the clinical pharmacist so that the appointment may be made for the proper length of time.     Continue current medications. Continue good therapeutic lifestyle changes which include good diet and exercise. Fall precautions discussed with patient. If an FOBT was given today- please return it to our front desk. If you are over 32 years old - you may need Prevnar 65 or the adult Pneumonia vaccine.  **Flu shots are available--- please call and schedule  a FLU-CLINIC appointment**  After your visit with [place] today you will receive a survey in the mail or online from [employer] regarding your care with [place]. Please take a moment to fill this out. Your feedback is very important to [place] as you can help [place] better understand your patient needs as well as improve your experience and satisfaction. WE CARE ABOUT YOU!!!   The patient should follow-up with her cardiologist as planned The niece should have her wear a wrist brace on her left hand nightly for 4 weeks. If this does not help the numbness after 4 weeks she should get back in touch with [place] as she may need to have perform nerve conduction velocity testing She should look into getting her out of the house if possible with more activities. Check into the adult daycare requirements in [place] and also look into the senior citizens watch at the Y in [place] Make sure that she stays well-hydrated Make sure that she uses her walker regularly and does not put herself at risk for falling   Arrie Senate MD

## 2015-09-29 NOTE — Patient Instructions (Addendum)
Medicare Annual Wellness Visit  [facility] and the medical providers at [facility] strive to bring you the best medical care.  In doing so we not only want to address your current medical conditions and concerns but also to detect new conditions early and prevent illness, disease and health-related problems.    Medicare offers a yearly Wellness Visit which allows our clinical staff to assess your need for preventative services including immunizations, lifestyle education, counseling to decrease risk of preventable diseases and screening for fall risk and other medical concerns.    This visit is provided free of charge (no copay) for all Medicare recipients. The clinical pharmacists at [facility] have begun to conduct these Wellness Visits which will also include a thorough review of all your medications.    As you primary medical provider recommend that you make an appointment for your Annual Wellness Visit if you have not done so already this year.  You may set up this appointment before you leave today or you may call back [phone] and schedule an appointment.  Please make sure when you call that you mention that you are scheduling your Annual Wellness Visit with the clinical pharmacist so that the appointment may be made for the proper length of time.     Continue current medications. Continue good therapeutic lifestyle changes which include good diet and exercise. Fall precautions discussed with patient. If an FOBT was given today- please return it to our front desk. If you are over 80 years old - you may need Prevnar 13 or the adult Pneumonia vaccine.  **Flu shots are available--- please call and schedule a FLU-CLINIC appointment**  After your visit with us today you will receive a survey in the mail or online from [employer] regarding your care with us. Please take a moment to fill this out. Your feedback is very  important to us as you can help us better understand your patient needs as well as improve your experience and satisfaction. WE CARE ABOUT YOU!!!   The patient should follow-up with her cardiologist as planned The niece should have her wear a wrist brace on her left hand nightly for 4 weeks. If this does not help the numbness after 4 weeks she should get back in touch with us as she may need to have perform nerve conduction velocity testing She should look into getting her out of the house if possible with more activities. Check into the adult daycare requirements in [place] and also look into the senior citizens watch at the Y in [place] Make sure that she stays well-hydrated Make sure that she uses her walker regularly and does not put herself at risk for falling

## 2015-09-30 ENCOUNTER — Other Ambulatory Visit: Payer: Self-pay | Admitting: *Deleted

## 2015-09-30 LAB — BMP8+EGFR
BUN / CREAT RATIO: 19 (ref 12–28)
BUN: 17 mg/dL (ref 8–27)
CO2: 31 mmol/L — ABNORMAL HIGH (ref 18–29)
CREATININE: 0.89 mg/dL (ref 0.57–1.00)
Calcium: 9.1 mg/dL (ref 8.7–10.3)
Chloride: 91 mmol/L — ABNORMAL LOW (ref 96–106)
GFR calc Af Amer: 67 mL/min/{1.73_m2} (ref >59)
GFR, EST NON AFRICAN AMERICAN: 58 mL/min/{1.73_m2} — AB (ref >59)
GLUCOSE: 127 mg/dL — AB (ref 65–99)
POTASSIUM: 3 mmol/L — AB (ref 3.5–5.2)
SODIUM: 142 mmol/L (ref 134–144)

## 2015-09-30 LAB — LIPID PANEL
CHOLESTEROL TOTAL: 230 mg/dL — AB (ref 100–199)
Chol/HDL Ratio: 2.8 ratio units (ref 0.0–4.4)
HDL: 81 mg/dL (ref >39)
LDL Calculated: 121 mg/dL — ABNORMAL HIGH (ref 0–99)
TRIGLYCERIDES: 141 mg/dL (ref 0–149)
VLDL Cholesterol Cal: 28 mg/dL (ref 5–40)

## 2015-09-30 LAB — CBC WITH DIFFERENTIAL/PLATELET
BASOS: 0 %
Basophils Absolute: 0 10*3/uL (ref 0.0–0.2)
EOS (ABSOLUTE): 0 10*3/uL (ref 0.0–0.4)
EOS: 0 %
HEMATOCRIT: 36.1 % (ref 34.0–46.6)
Hemoglobin: 11.9 g/dL (ref 11.1–15.9)
Immature Grans (Abs): 0.1 10*3/uL (ref 0.0–0.1)
Immature Granulocytes: 1 %
LYMPHS ABS: 1.9 10*3/uL (ref 0.7–3.1)
Lymphs: 13 %
MCH: 30.7 pg (ref 26.6–33.0)
MCHC: 33 g/dL (ref 31.5–35.7)
MCV: 93 fL (ref 79–97)
MONOS ABS: 1.1 10*3/uL — AB (ref 0.1–0.9)
Monocytes: 8 %
Neutrophils Absolute: 11.4 10*3/uL — ABNORMAL HIGH (ref 1.4–7.0)
Neutrophils: 78 %
Platelets: 301 10*3/uL (ref 150–379)
RBC: 3.87 x10E6/uL (ref 3.77–5.28)
RDW: 14 % (ref 12.3–15.4)
WBC: 14.5 10*3/uL — AB (ref 3.4–10.8)

## 2015-09-30 LAB — THYROID PANEL WITH TSH
FREE THYROXINE INDEX: 1.7 (ref 1.2–4.9)
T3 UPTAKE RATIO: 31 % (ref 24–39)
T4, Total: 5.5 ug/dL (ref 4.5–12.0)
TSH: 27.61 u[IU]/mL — ABNORMAL HIGH (ref 0.450–4.500)

## 2015-09-30 LAB — HEPATIC FUNCTION PANEL
ALT: 13 IU/L (ref 0–32)
AST: 14 IU/L (ref 0–40)
Albumin: 4.2 g/dL (ref 3.5–4.7)
Alkaline Phosphatase: 67 IU/L (ref 39–117)
Bilirubin Total: 0.4 mg/dL (ref 0.0–1.2)
Bilirubin, Direct: 0.11 mg/dL (ref 0.00–0.40)
Total Protein: 6.2 g/dL (ref 6.0–8.5)

## 2015-09-30 LAB — VITAMIN D 25 HYDROXY (VIT D DEFICIENCY, FRACTURES): Vit D, 25-Hydroxy: 30.8 ng/mL (ref 30.0–100.0)

## 2015-10-01 ENCOUNTER — Other Ambulatory Visit: Payer: Medicare Other

## 2015-10-01 DIAGNOSIS — Z1211 Encounter for screening for malignant neoplasm of colon: Secondary | ICD-10-CM

## 2015-10-02 ENCOUNTER — Telehealth: Payer: Self-pay | Admitting: Family Medicine

## 2015-10-03 ENCOUNTER — Other Ambulatory Visit: Payer: Self-pay | Admitting: *Deleted

## 2015-10-03 MED ORDER — LEVOTHYROXINE SODIUM 75 MCG PO TABS: 75.0000 ug | ORAL_TABLET | Freq: Every day | ORAL | Status: DC

## 2015-10-03 MED ORDER — POTASSIUM CHLORIDE CRYS ER 20 MEQ PO TBCR: 20.0000 meq | EXTENDED_RELEASE_TABLET | Freq: Every day | ORAL | Status: DC

## 2015-10-03 NOTE — Telephone Encounter (Signed)
Spoke with [name] and advised we are still waiting to hear back from the cardiologist office. [name] is working on this.

## 2015-10-03 NOTE — Telephone Encounter (Signed)
I spoke with Dr Luevenia Maxinlevenger's nurse and he is out today -- she reviewed the records and stated there was no significant reason that potassium was stopped -- it was just "normal" the last last time   I started her on potassium 20 meq daily  - [name] aware - pharm aware Aware to do labs in 2 weeks

## 2015-10-07 LAB — FECAL OCCULT BLOOD, IMMUNOCHEMICAL: FECAL OCCULT BLD: POSITIVE — AB

## 2015-10-08 ENCOUNTER — Other Ambulatory Visit: Payer: Self-pay | Admitting: *Deleted

## 2015-10-08 DIAGNOSIS — R195 Other fecal abnormalities: Secondary | ICD-10-CM

## 2015-10-23 ENCOUNTER — Other Ambulatory Visit: Payer: Self-pay | Admitting: Family Medicine

## 2015-10-23 ENCOUNTER — Other Ambulatory Visit: Payer: Medicare Other

## 2015-10-23 DIAGNOSIS — R195 Other fecal abnormalities: Secondary | ICD-10-CM

## 2015-10-24 LAB — CBC WITH DIFFERENTIAL/PLATELET
Basophils Absolute: 0.1 10*3/uL (ref 0.0–0.2)
Basos: 1 %
EOS (ABSOLUTE): 0.1 10*3/uL (ref 0.0–0.4)
EOS: 1 %
HEMATOCRIT: 37 % (ref 34.0–46.6)
Hemoglobin: 12 g/dL (ref 11.1–15.9)
Immature Grans (Abs): 0 10*3/uL (ref 0.0–0.1)
Immature Granulocytes: 0 %
LYMPHS ABS: 1.7 10*3/uL (ref 0.7–3.1)
Lymphs: 27 %
MCH: 30.5 pg (ref 26.6–33.0)
MCHC: 32.4 g/dL (ref 31.5–35.7)
MCV: 94 fL (ref 79–97)
MONOS ABS: 0.6 10*3/uL (ref 0.1–0.9)
Monocytes: 10 %
Neutrophils Absolute: 3.8 10*3/uL (ref 1.4–7.0)
Neutrophils: 61 %
Platelets: 320 10*3/uL (ref 150–379)
RBC: 3.93 x10E6/uL (ref 3.77–5.28)
RDW: 13 % (ref 12.3–15.4)
WBC: 6.2 10*3/uL (ref 3.4–10.8)

## 2015-10-27 LAB — FECAL OCCULT BLOOD, IMMUNOCHEMICAL: FECAL OCCULT BLD: POSITIVE — AB

## 2015-10-29 NOTE — Addendum Note (Signed)
Addended by: Angela AdamOSTOSKY, JESSICA C on: 10/29/2015 09:46 AM   Modules accepted: Orders

## 2015-11-13 ENCOUNTER — Other Ambulatory Visit: Payer: Medicare Other

## 2015-11-13 DIAGNOSIS — R195 Other fecal abnormalities: Secondary | ICD-10-CM

## 2015-11-13 LAB — CBC WITH DIFFERENTIAL/PLATELET
Basophils Absolute: 0.1 10*3/uL (ref 0.0–0.2)
Basos: 1 %
EOS (ABSOLUTE): 0.1 10*3/uL (ref 0.0–0.4)
EOS: 1 %
HEMATOCRIT: 35.6 % (ref 34.0–46.6)
HEMOGLOBIN: 11.6 g/dL (ref 11.1–15.9)
IMMATURE GRANS (ABS): 0 10*3/uL (ref 0.0–0.1)
Immature Granulocytes: 0 %
LYMPHS ABS: 1.7 10*3/uL (ref 0.7–3.1)
LYMPHS: 30 %
MCH: 30.8 pg (ref 26.6–33.0)
MCHC: 32.6 g/dL (ref 31.5–35.7)
MCV: 94 fL (ref 79–97)
MONOCYTES: 11 %
Monocytes Absolute: 0.6 10*3/uL (ref 0.1–0.9)
NEUTROS ABS: 3.2 10*3/uL (ref 1.4–7.0)
Neutrophils: 57 %
Platelets: 262 10*3/uL (ref 150–379)
RBC: 3.77 x10E6/uL (ref 3.77–5.28)
RDW: 13 % (ref 12.3–15.4)
WBC: 5.7 10*3/uL (ref 3.4–10.8)

## 2015-11-19 ENCOUNTER — Other Ambulatory Visit: Payer: Self-pay | Admitting: Family Medicine

## 2015-12-03 ENCOUNTER — Other Ambulatory Visit: Payer: Self-pay | Admitting: Family Medicine

## 2015-12-31 ENCOUNTER — Other Ambulatory Visit: Payer: Self-pay | Admitting: Family Medicine

## 2016-01-19 ENCOUNTER — Other Ambulatory Visit: Payer: Self-pay | Admitting: Family Medicine

## 2016-02-05 ENCOUNTER — Other Ambulatory Visit: Payer: Self-pay | Admitting: Family Medicine

## 2016-03-16 ENCOUNTER — Other Ambulatory Visit: Payer: Self-pay | Admitting: Family Medicine

## 2016-03-23 ENCOUNTER — Telehealth: Payer: Self-pay | Admitting: Family Medicine

## 2016-03-29 ENCOUNTER — Encounter: Payer: Self-pay | Admitting: Family Medicine

## 2016-03-29 ENCOUNTER — Ambulatory Visit (INDEPENDENT_AMBULATORY_CARE_PROVIDER_SITE_OTHER): Payer: Medicare Other | Admitting: Family Medicine

## 2016-03-29 VITALS — BP 167/71 | HR 53 | Temp 97.2°F | Ht 60.0 in | Wt 132.0 lb

## 2016-03-29 DIAGNOSIS — E7800 Pure hypercholesterolemia, unspecified: Secondary | ICD-10-CM | POA: Diagnosis not present

## 2016-03-29 DIAGNOSIS — E559 Vitamin D deficiency, unspecified: Secondary | ICD-10-CM

## 2016-03-29 DIAGNOSIS — Z23 Encounter for immunization: Secondary | ICD-10-CM | POA: Diagnosis not present

## 2016-03-29 DIAGNOSIS — R413 Other amnesia: Secondary | ICD-10-CM

## 2016-03-29 DIAGNOSIS — I10 Essential (primary) hypertension: Secondary | ICD-10-CM

## 2016-03-29 DIAGNOSIS — E034 Atrophy of thyroid (acquired): Secondary | ICD-10-CM | POA: Diagnosis not present

## 2016-03-29 DIAGNOSIS — I480 Paroxysmal atrial fibrillation: Secondary | ICD-10-CM | POA: Diagnosis not present

## 2016-03-29 DIAGNOSIS — K219 Gastro-esophageal reflux disease without esophagitis: Secondary | ICD-10-CM

## 2016-03-29 NOTE — Patient Instructions (Addendum)
Medicare Annual Wellness Visit  [facility] and the medical providers at [facility] strive to bring you the best medical care.  In doing so we not only want to address your current medical conditions and concerns but also to detect new conditions early and prevent illness, disease and health-related problems.    Medicare offers a yearly Wellness Visit which allows our clinical staff to assess your need for preventative services including immunizations, lifestyle education, counseling to decrease risk of preventable diseases and screening for fall risk and other medical concerns.    This visit is provided free of charge (no copay) for all Medicare recipients. The clinical pharmacists at [facility] have begun to conduct these Wellness Visits which will also include a thorough review of all your medications.    As you primary medical provider recommend that you make an appointment for your Annual Wellness Visit if you have not done so already this year.  You may set up this appointment before you leave today or you may call back [phone] and schedule an appointment.  Please make sure when you call that you mention that you are scheduling your Annual Wellness Visit with the clinical pharmacist so that the appointment may be made for the proper length of time.     Continue current medications. Continue good therapeutic lifestyle changes which include good diet and exercise. Fall precautions discussed with patient. If an FOBT was given today- please return it to our front desk. If you are over 366 years old - you may need Prevnar 13 or the adult Pneumonia vaccine.  **Flu shots are available--- please call and schedule a FLU-CLINIC appointment**  After your visit with us today you will receive a survey in the mail or online from [employer] regarding your care with us. Please take a moment to fill this out. Your feedback is very  important to us as you can help us better understand your patient needs as well as improve your experience and satisfaction. WE CARE ABOUT YOU!!!   The patient is encouraged to keep her appointment with her cardiologist that has now moved to continence 4. The niece will arrange this visit. She is encouraged to use nasal saline gel and the nasal saline spray in each nostril She is encouraged to walk as much as possible especially when her family members take her out to go to the grocery store.

## 2016-03-29 NOTE — Progress Notes (Signed)
Subjective:    Patient ID: [name], female    DOB: 12-12-1924, 82 y.o.   MRN: [id number]  HPI Pt here for follow up and management of chronic medical problems which includes hyperlipidemia and hypertension. She is taking medications regularly.The patient comes to the visit today with her niece. She lives at home by herself but has frequent checks by her relatives. According to the niece she has her continues to have memory loss especially short-term. The patient continues to have problems with nasal congestion and drainage. This is her main complaint today. She has a history of atrial fibrillation and congestive heart failure and hyperlipidemia and hypothyroidism. She also has a remote history of GI bleeding. The patient denies any chest pain or shortness of breath. She does have a runny nose and congestion and she does have nasal saline at home and will be encouraged to use this more frequently. She has had some nosebleeds 2 she has nasal saline gel and she will once again the encouraged to use this regularly. She denies any trouble with her stomach including nausea vomiting diarrhea blood in the stool or black tarry bowel movements. She says her bowel movements move regularly and they're normal colored. She passing her water without problems. She is alert and laughing during the visit today and there was no obvious memory impairment during that short period of time.    Patient Active Problem List   Diagnosis Date Noted  . Hypothyroidism 03/31/2015  . CHF (congestive heart failure) ([place]) 11/15/2014  . Absolute anemia 10/18/2014  . Bruising, spontaneous 09/16/2014  . Long term current use of anticoagulant 09/16/2014  . Atrial fibrillation ([place]) 04/15/2014  . Edema 04/15/2014  . Nodule of right lung 05/27/2013  . Memory impairment 03/12/2013  . Prophylactic immunotherapy 10/24/2012  . Dyspnea 07/06/2012  . Extrinsic asthma 08/31/2010  . Atrophic vaginitis   . Malaise and fatigue     . Thoracic scoliosis   . Insomnia, idiopathic   . Gastritis   . Gastroesophageal reflux disease with hiatal hernia   . Colon polyps   . Diverticulosis   . Osteoarthrosis and allied disorders   . Hyperlipidemia 02/11/2009  . Essential hypertension 02/10/2009   Outpatient Encounter Prescriptions as of 03/29/2016  Medication Sig  . albuterol (PROAIR HFA) 108 (90 BASE) MCG/ACT inhaler Inhale 2 puffs into the lungs every 6 (six) hours as needed.  Marland Kitchen amiodarone (PACERONE) 200 MG tablet Take 0.5 tablets (100 mg total) by mouth daily.  . budesonide-formoterol (SYMBICORT) 80-4.5 MCG/ACT inhaler Inhale 2 puffs into the lungs 2 (two) times daily.  . busPIRone (BUSPAR) 5 MG tablet TAKE ONE TABLET AT BEDTIME  . donepezil (ARICEPT) 10 MG tablet TAKE 1 TABLET DAILY  . furosemide (LASIX) 20 MG tablet TAKE (1) TABLET A DAY AS DIRECTED.  Marland Kitchen losartan (COZAAR) 100 MG tablet Take 1 tablet (100 mg total) by mouth daily.  . memantine (NAMENDA) 10 MG tablet Take 1 tablet (10 mg total) by mouth 2 (two) times daily.  . metoprolol succinate (TOPROL-XL) 25 MG 24 hr tablet Take 1 tablet (25 mg total) by mouth daily.  . Multiple Vitamin (MULTIVITAMIN WITH MINERALS) TABS tablet Take 1 tablet by mouth daily.  Marland Kitchen omeprazole (PRILOSEC) 20 MG capsule TAKE (1) CAPSULE TWICE DAILY.  Marland Kitchen XARELTO 15 MG TABS tablet TAKE (1) TABLET DAILY WITH SUPPER.  . [DISCONTINUED] amLODipine (NORVASC) 10 MG tablet Take 10 mg by mouth daily.  . [DISCONTINUED] levothyroxine (SYNTHROID, LEVOTHROID) 75 MCG tablet Take 1  tablet (75 mcg total) by mouth daily. (Patient not taking: Reported on 03/29/2016)  . [DISCONTINUED] potassium chloride SA (K-DUR,KLOR-CON) 20 MEQ tablet Take 1 tablet (20 mEq total) by mouth daily. (Patient not taking: Reported on 03/29/2016)  . [DISCONTINUED] Rivaroxaban (XARELTO) 15 MG TABS tablet TAKE (1) TABLET DAILY WITH SUPPER.   No facility-administered encounter medications on file as of 03/29/2016.       Review of Systems   Constitutional: Negative.   HENT: Positive for postnasal drip (runny nose).   Eyes: Negative.   Respiratory: Negative.   Cardiovascular: Negative.   Gastrointestinal: Negative.   Endocrine: Negative.   Genitourinary: Negative.   Musculoskeletal: Negative.   Skin: Negative.   Allergic/Immunologic: Negative.   Neurological: Negative.   Hematological: Negative.   Psychiatric/Behavioral: Negative.        Objective:   Physical Exam  Constitutional: She is oriented to person, place, and time. She appears well-developed and well-nourished. No distress.  The elderly and with a history of impaired memory, the patient seems to be alert today laughing and responding appropriately to questions asked of her  HENT:  Head: Normocephalic and atraumatic.  Right Ear: External ear normal.  Left Ear: External ear normal.  Nose: Nose normal.  Mouth/Throat: Oropharynx is clear and moist.  Nasal passages were clear with no sign of any bleeding or significant drainage.  Eyes: Conjunctivae and EOM are normal. Pupils are equal, round, and reactive to light. Right eye exhibits no discharge. Left eye exhibits no discharge. No scleral icterus.  Neck: Normal range of motion. Neck supple. No thyromegaly present.  No bruits thyromegaly or anterior cervical adenopathy   Cardiovascular: Normal rate, regular rhythm, normal heart sounds and intact distal pulses.   No murmur heard. The heart was regular at 60/m  Pulmonary/Chest: Effort normal and breath sounds normal. No respiratory distress. She has no wheezes. She has no rales.  Abdominal: Soft. Bowel sounds are normal. She exhibits no mass. There is no tenderness. There is no rebound and no guarding.  No abdominal tenderness masses or organ enlargement  Musculoskeletal: Normal range of motion. She exhibits no edema.  The patient is slightly unstable with her gait and needs assistance with getting on the table.  Lymphadenopathy:    She has no cervical  adenopathy.  Neurological: She is alert and oriented to person, place, and time. She has normal reflexes. No cranial nerve deficit.  Skin: Skin is warm and dry. No rash noted.  Psychiatric: She has a normal mood and affect. Her behavior is normal. Judgment and thought content normal.  Affect was pleasant  Nursing note and vitals reviewed.   BP (!) 152/64 (BP Location: Left Arm)   Pulse (!) 53   Temp 97.2 F (36.2 C) (Oral)   Ht 5' (1.524 m)   Wt 132 lb (59.9 kg)   BMI 25.78 kg/m        Assessment & Plan:  1. Hypothyroidism due to acquired atrophy of thyroid -Check thyroid profile because of history of amiodarone. - CBC with Differential/Platelet - Thyroid Panel With TSH  2. Pure hypercholesterolemia -Continue with aggressive therapeutic lifestyle changes - CBC with Differential/Platelet - Lipid panel  3. Essential hypertension -The blood pressure slightly elevated today and we will not make any changes in her treatment. - BMP8+EGFR - CBC with Differential/Platelet - Hepatic function panel  4. Paroxysmal atrial fibrillation (HCC) -Continue follow-up with cardiology and patient's niece will make an appointment for this. The patient was in normal sinus rhythm today  at 60/m - CBC with Differential/Platelet  5. Vitamin D deficiency -Continue vitamin D replacement pending results of lab work - CBC with Differential/Platelet - VITAMIN D 25 Hydroxy (Vit-D Deficiency, Fractures)  6. Gastroesophageal reflux disease, esophagitis presence not specified -She has had a history of GI bleeding in the past. Her epigastrium was nontender today and she is not getting any history of black tarry stools. - CBC with Differential/Platelet - Hepatic function panel  7. Memory impairment -Memory and alert wise the patient seems to be good today during her visit  No orders of the defined types were placed in this encounter.  Patient Instructions                       Medicare Annual  Wellness Visit  [facility] and the medical providers at [facility] strive to bring you the best medical care.  In doing so we not only want to address your current medical conditions and concerns but also to detect new conditions early and prevent illness, disease and health-related problems.    Medicare offers a yearly Wellness Visit which allows our clinical staff to assess your need for preventative services including immunizations, lifestyle education, counseling to decrease risk of preventable diseases and screening for fall risk and other medical concerns.    This visit is provided free of charge (no copay) for all Medicare recipients. The clinical pharmacists at [facility] have begun to conduct these Wellness Visits which will also include a thorough review of all your medications.    As you primary medical provider recommend that you make an appointment for your Annual Wellness Visit if you have not done so already this year.  You may set up this appointment before you leave today or you may call back ([phone]) and schedule an appointment.  Please make sure when you call that you mention that you are scheduling your Annual Wellness Visit with the clinical pharmacist so that the appointment may be made for the proper length of time.     Continue current medications. Continue good therapeutic lifestyle changes which include good diet and exercise. Fall precautions discussed with patient. If an FOBT was given today- please return it to our front desk. If you are over 47 years old - you may need Prevnar 66 or the adult Pneumonia vaccine.  **Flu shots are available--- please call and schedule a FLU-CLINIC appointment**  After your visit with [place] today you will receive a survey in the mail or online from [employer] regarding your care with [place]. Please take a moment to fill this out. Your feedback is very important to [place] as you can help [place] better  understand your patient needs as well as improve your experience and satisfaction. WE CARE ABOUT YOU!!!   The patient is encouraged to keep her appointment with her cardiologist that has now moved to continence 4. The niece will arrange this visit. She is encouraged to use nasal saline gel and the nasal saline spray in each nostril She is encouraged to walk as much as possible especially when her family members take her out to go to the grocery store.   Arrie Senate MD

## 2016-03-30 LAB — LIPID PANEL
CHOL/HDL RATIO: 2.7 ratio (ref 0.0–4.4)
Cholesterol, Total: 183 mg/dL (ref 100–199)
HDL: 67 mg/dL (ref >39)
LDL CALC: 96 mg/dL (ref 0–99)
TRIGLYCERIDES: 99 mg/dL (ref 0–149)
VLDL Cholesterol Cal: 20 mg/dL (ref 5–40)

## 2016-03-30 LAB — HEPATIC FUNCTION PANEL
ALBUMIN: 4.3 g/dL (ref 3.5–4.7)
ALK PHOS: 69 IU/L (ref 39–117)
ALT: 11 IU/L (ref 0–32)
AST: 22 IU/L (ref 0–40)
BILIRUBIN TOTAL: 0.2 mg/dL (ref 0.0–1.2)
BILIRUBIN, DIRECT: 0.08 mg/dL (ref 0.00–0.40)
Total Protein: 6.6 g/dL (ref 6.0–8.5)

## 2016-03-30 LAB — THYROID PANEL WITH TSH
FREE THYROXINE INDEX: 2.3 (ref 1.2–4.9)
T3 UPTAKE RATIO: 29 % (ref 24–39)
T4, Total: 7.8 ug/dL (ref 4.5–12.0)
TSH: 4.82 u[IU]/mL — AB (ref 0.450–4.500)

## 2016-03-30 LAB — CBC WITH DIFFERENTIAL/PLATELET
BASOS ABS: 0 10*3/uL (ref 0.0–0.2)
Basos: 0 %
EOS (ABSOLUTE): 0.1 10*3/uL (ref 0.0–0.4)
Eos: 1 %
Hematocrit: 34.4 % (ref 34.0–46.6)
Hemoglobin: 10.8 g/dL — ABNORMAL LOW (ref 11.1–15.9)
IMMATURE GRANULOCYTES: 0 %
Immature Grans (Abs): 0 10*3/uL (ref 0.0–0.1)
LYMPHS ABS: 2.3 10*3/uL (ref 0.7–3.1)
Lymphs: 25 %
MCH: 28.9 pg (ref 26.6–33.0)
MCHC: 31.4 g/dL — AB (ref 31.5–35.7)
MCV: 92 fL (ref 79–97)
MONOS ABS: 0.7 10*3/uL (ref 0.1–0.9)
Monocytes: 7 %
NEUTROS PCT: 67 %
Neutrophils Absolute: 6.3 10*3/uL (ref 1.4–7.0)
PLATELETS: 288 10*3/uL (ref 150–379)
RBC: 3.74 x10E6/uL — AB (ref 3.77–5.28)
RDW: 13.4 % (ref 12.3–15.4)
WBC: 9.4 10*3/uL (ref 3.4–10.8)

## 2016-03-30 LAB — BMP8+EGFR
BUN / CREAT RATIO: 12 (ref 12–28)
BUN: 11 mg/dL (ref 8–27)
CHLORIDE: 98 mmol/L (ref 96–106)
CO2: 29 mmol/L (ref 18–29)
CREATININE: 0.93 mg/dL (ref 0.57–1.00)
Calcium: 9.6 mg/dL (ref 8.7–10.3)
GFR calc Af Amer: 63 mL/min/{1.73_m2} (ref >59)
GFR calc non Af Amer: 55 mL/min/{1.73_m2} — ABNORMAL LOW (ref >59)
GLUCOSE: 84 mg/dL (ref 65–99)
Potassium: 5.2 mmol/L (ref 3.5–5.2)
SODIUM: 138 mmol/L (ref 134–144)

## 2016-03-30 LAB — VITAMIN D 25 HYDROXY (VIT D DEFICIENCY, FRACTURES): Vit D, 25-Hydroxy: 72 ng/mL (ref 30.0–100.0)

## 2016-03-31 ENCOUNTER — Telehealth: Payer: Self-pay | Admitting: Family Medicine

## 2016-03-31 ENCOUNTER — Other Ambulatory Visit: Payer: Self-pay | Admitting: Family Medicine

## 2016-03-31 NOTE — Telephone Encounter (Signed)
FYI

## 2016-04-02 NOTE — Telephone Encounter (Signed)
What was this call about?

## 2016-04-05 MED ORDER — LEVOTHYROXINE SODIUM 75 MCG PO TABS: 75.0000 ug | ORAL_TABLET | Freq: Every day | ORAL | 3 refills | Status: DC

## 2016-04-05 NOTE — Telephone Encounter (Signed)
Pt is currently Levothyroxine Corrected in medication list

## 2016-04-17 ENCOUNTER — Other Ambulatory Visit: Payer: Self-pay | Admitting: Family Medicine

## 2016-04-27 ENCOUNTER — Other Ambulatory Visit: Payer: Self-pay | Admitting: Family Medicine

## 2016-05-14 ENCOUNTER — Other Ambulatory Visit: Payer: Self-pay | Admitting: Family Medicine

## 2016-06-03 ENCOUNTER — Other Ambulatory Visit: Payer: Self-pay | Admitting: Family Medicine

## 2016-06-21 ENCOUNTER — Ambulatory Visit (INDEPENDENT_AMBULATORY_CARE_PROVIDER_SITE_OTHER): Payer: Medicare Other | Admitting: Family Medicine

## 2016-06-21 ENCOUNTER — Encounter: Payer: Self-pay | Admitting: Family Medicine

## 2016-06-21 ENCOUNTER — Ambulatory Visit (INDEPENDENT_AMBULATORY_CARE_PROVIDER_SITE_OTHER): Payer: Medicare Other

## 2016-06-21 VITALS — BP 106/58 | HR 72 | Temp 98.3°F | Ht 60.0 in | Wt 141.0 lb

## 2016-06-21 DIAGNOSIS — R062 Wheezing: Secondary | ICD-10-CM

## 2016-06-21 MED ORDER — ALBUTEROL SULFATE HFA 108 (90 BASE) MCG/ACT IN AERS: 2.0000 | INHALATION_SPRAY | Freq: Four times a day (QID) | RESPIRATORY_TRACT | 3 refills | Status: DC | PRN

## 2016-06-21 MED ORDER — AMOXICILLIN-POT CLAVULANATE 875-125 MG PO TABS: 1.0000 | ORAL_TABLET | Freq: Two times a day (BID) | ORAL | 0 refills | Status: DC

## 2016-06-21 MED ORDER — ALBUTEROL SULFATE (2.5 MG/3ML) 0.083% IN NEBU: 2.5000 mg | INHALATION_SOLUTION | Freq: Four times a day (QID) | RESPIRATORY_TRACT | 1 refills | Status: DC | PRN

## 2016-06-21 MED ORDER — METHYLPREDNISOLONE 4 MG PO TBPK: ORAL_TABLET | ORAL | 0 refills | Status: DC

## 2016-06-21 NOTE — Progress Notes (Signed)
Subjective:  Patient ID: [name], female    DOB: November 01, 1924  Age: 83 y.o. MRN: [id number]  CC: Cough (pt here today c/o cough, and wheezing)   HPI [name] presents for 4 days of increasing cough. She has been short of breath with just walking from the bedroom to the bathroom to the living room. She is having to have assistance to do ADLs. She has been getting worse in spite of using over-the-counter Mucinex. She also has some nebulizer treatments with albuterol that she got from her mother. Those have not helped as of yet either. Her niece who is with her states that she has been doing a lot of wheezing as well as coughing. The cough has been dry. She maintains a good appetite.   History [name] has a past medical history of Abdominal pain; Atrophic vaginitis; Colon polyps; Diverticulosis; Essential hypertension, benign; Gastritis; Hiatal hernia; Insomnia, idiopathic; Malaise and fatigue; Osteoarthrosis and allied disorders; Other and unspecified hyperlipidemia; and Thoracic scoliosis.   She has a past surgical history that includes Joint replacement; rt knee  (10 1 08); Rt rotator  cuff repair (4 1 00); and left shoulder surgery .   Her family history includes Cancer in her sister; Heart disease (age of onset: 41) in her father.She reports that she has quit smoking. Her smoking use included Cigarettes. She has a 0.50 pack-year smoking history. She has never used smokeless tobacco. She reports that she does not drink alcohol or use drugs.    ROS Review of Systems  Constitutional: Negative for activity change, appetite change and fever.  HENT: Negative for congestion, rhinorrhea and sore throat.   Eyes: Negative for visual disturbance.  Respiratory: Positive for cough and wheezing. Negative for shortness of breath.   Cardiovascular: Negative for chest pain and palpitations.  Gastrointestinal: Negative for abdominal pain, diarrhea and nausea.  Genitourinary: Negative for  dysuria.  Musculoskeletal: Negative for arthralgias and myalgias.    Objective:  BP (!) 106/58   Pulse 72   Temp 98.3 F (36.8 C) (Oral)   Ht 5' (1.524 m)   Wt 141 lb (64 kg)   SpO2 98%   BMI 27.54 kg/m   BP Readings from Last 3 Encounters:  06/21/16 (!) 106/58  03/29/16 (!) 167/71  09/29/15 111/64    Wt Readings from Last 3 Encounters:  06/21/16 141 lb (64 kg)  03/29/16 132 lb (59.9 kg)  09/29/15 132 lb (59.9 kg)     Physical Exam  Constitutional: She is oriented to person, place, and time. She appears well-developed and well-nourished. No distress.  HENT:  Head: Normocephalic and atraumatic.  Eyes: Conjunctivae are normal. Pupils are equal, round, and reactive to light.  Neck: Normal range of motion. Neck supple. No thyromegaly present.  Cardiovascular: Normal rate, regular rhythm and normal heart sounds.   No murmur heard. Pulmonary/Chest: Effort normal. No respiratory distress. She has wheezes (throughout all fields, coarse, exp). She has rales (Right base).  Abdominal: Soft. Bowel sounds are normal. She exhibits no distension. There is no tenderness.  Musculoskeletal: Normal range of motion.  Lymphadenopathy:    She has no cervical adenopathy.  Neurological: She is alert and oriented to person, place, and time.  Skin: Skin is warm and dry.  Psychiatric: She has a normal mood and affect. Her behavior is normal. Judgment and thought content normal.    Ct Chest Wo Contrast  Result Date: 12/14/2013 CLINICAL DATA:  Followup indeterminate right lung nodule. EXAM: CT CHEST  WITHOUT CONTRAST TECHNIQUE: Multidetector CT imaging of the chest was performed following the standard protocol without IV contrast. COMPARISON:  06/01/2013 and 02/28/2013 FINDINGS: Mediastinum/[place] Regions: No masses or pathologically enlarged lymph nodes identified. Other Thoracic Lymphadenopathy:  None. Lungs: Sub-solid nodular density in the right lung apex measures 12 x 11 mm on image 8. This is  stable in size and appearance since prior study. Adjacent scarring in the right apex is also unchanged. No new or enlarging pulmonary nodules or masses are identified. No evidence of pulmonary airspace disease or central endobronchial obstruction. Pleural-parenchymal scarring in the left lung base is stable. Pleura:  No evidence of effusion or mass. Vascular/Cardiac: No thoracic aortic aneurysm or other significant abnormality identified. Musculoskeletal:  No suspicious bone lesions identified. Other:  None. IMPRESSION: Stable asymmetric right apical scarring and sub-solid pulmonary nodule in the right lung apex. Continued yearly followup by CT is recommended. This recommendation follows the consensus statement: Recommendations for the Management of Subsolid Pulmonary Nodules Detected at CT: A Statement from the Fleischner Society. Radiology 2013;266:304-317. Electronically Signed   By: Myles Rosenthal M.D.   On: 12/14/2013 16:42    Assessment & Plan:   Naz was seen today for cough.  Diagnoses and all orders for this visit:  Wheezes -     DG Chest 2 View; Future -     DME Nebulizer machine  Wheezing -     DME Nebulizer machine -     albuterol (PROAIR HFA) 108 (90 Base) MCG/ACT inhaler; Inhale 2 puffs into the lungs every 6 (six) hours as needed.  Other orders -     albuterol (PROVENTIL) (2.5 MG/3ML) 0.083% nebulizer solution; Take 3 mLs (2.5 mg total) by nebulization every 6 (six) hours as needed for wheezing or shortness of breath. -     amoxicillin-clavulanate (AUGMENTIN) 875-125 MG tablet; Take 1 tablet by mouth 2 (two) times daily. Take all of this medication -     methylPREDNISolone (MEDROL DOSEPAK) 4 MG TBPK tablet; Start with 6 on the first day and take one less each day until finished       I have discontinued Ms. Kean's XARELTO. I am also having her start on albuterol, amoxicillin-clavulanate, and methylPREDNISolone. Additionally, I am having her maintain her  budesonide-formoterol, multivitamin with minerals, losartan, memantine, metoprolol succinate, levothyroxine, donepezil, omeprazole, furosemide, busPIRone, amiodarone, and albuterol.  Allergies as of 06/21/2016      Reactions   Ace Inhibitors Cough   Zocor [simvastatin] Other (See Comments)   Leg pain      Medication List       Accurate as of 06/21/16 11:04 AM. Always use your most recent med list.          albuterol (2.5 MG/3ML) 0.083% nebulizer solution Commonly known as:  PROVENTIL Take 3 mLs (2.5 mg total) by nebulization every 6 (six) hours as needed for wheezing or shortness of breath.   albuterol 108 (90 Base) MCG/ACT inhaler Commonly known as:  PROAIR HFA Inhale 2 puffs into the lungs every 6 (six) hours as needed.   amiodarone 200 MG tablet Commonly known as:  PACERONE TAKE (1/2) TABLET DAILY.   amoxicillin-clavulanate 875-125 MG tablet Commonly known as:  AUGMENTIN Take 1 tablet by mouth 2 (two) times daily. Take all of this medication   budesonide-formoterol 80-4.5 MCG/ACT inhaler Commonly known as:  SYMBICORT Inhale 2 puffs into the lungs 2 (two) times daily.   busPIRone 5 MG tablet Commonly known as:  BUSPAR TAKE ONE TABLET AT BEDTIME  donepezil 10 MG tablet Commonly known as:  ARICEPT TAKE 1 TABLET DAILY   furosemide 20 MG tablet Commonly known as:  LASIX TAKE (1) TABLET A DAY AS DIRECTED.   levothyroxine 75 MCG tablet Commonly known as:  SYNTHROID, LEVOTHROID Take 1 tablet (75 mcg total) by mouth daily.   losartan 100 MG tablet Commonly known as:  COZAAR TAKE 1 TABLET DAILY   memantine 10 MG tablet Commonly known as:  NAMENDA TAKE  (1)  TABLET TWICE A DAY.   methylPREDNISolone 4 MG Tbpk tablet Commonly known as:  MEDROL DOSEPAK Start with 6 on the first day and take one less each day until finished   metoprolol succinate 25 MG 24 hr tablet Commonly known as:  TOPROL-XL TAKE 1 TABLET DAILY   multivitamin with minerals Tabs tablet Take  1 tablet by mouth daily.   omeprazole 20 MG capsule Commonly known as:  PRILOSEC TAKE (1) CAPSULE TWICE DAILY.            Durable Medical Equipment        Start     Ordered   06/21/16 0000  DME Nebulizer machine    Question:  Patient needs a nebulizer to treat with the following condition  Answer:  Wheezing-associated respiratory infection   06/21/16 1042     Chest x-ray shows pleural effusion. Perhaps small right lower lobe infiltrate.  Follow-up: Return in about 4 days (around 06/25/2016).  Mechele ClaudeWarren Tinlee Navarrette, M.D.

## 2016-06-24 ENCOUNTER — Other Ambulatory Visit: Payer: Self-pay | Admitting: Family Medicine

## 2016-06-25 ENCOUNTER — Ambulatory Visit (INDEPENDENT_AMBULATORY_CARE_PROVIDER_SITE_OTHER): Payer: Medicare Other | Admitting: Family Medicine

## 2016-06-25 ENCOUNTER — Encounter: Payer: Self-pay | Admitting: Family Medicine

## 2016-06-25 VITALS — BP 113/62 | HR 72 | Temp 98.6°F | Ht 60.0 in | Wt 139.0 lb

## 2016-06-25 DIAGNOSIS — J4521 Mild intermittent asthma with (acute) exacerbation: Secondary | ICD-10-CM | POA: Diagnosis not present

## 2016-06-25 NOTE — Progress Notes (Signed)
Subjective:  Patient ID: [name], female    DOB: November 10, 1924  Age: 83 y.o. MRN: [id number]  CC: Follow-up (pt here today for one week follow up for wheezing, she states she is feeling better.)   HPI [name] presents for recheck of pneumonia. Verified tx as discussed at onset. Energy better, breathing better. Cough decreased. No noticeable wheezing    History [name] has a past medical history of Abdominal pain; Atrophic vaginitis; Colon polyps; Diverticulosis; Essential hypertension, benign; Gastritis; Hiatal hernia; Insomnia, idiopathic; Malaise and fatigue; Osteoarthrosis and allied disorders; Other and unspecified hyperlipidemia; and Thoracic scoliosis.   She has a past surgical history that includes Joint replacement; rt knee  (10 1 08); Rt rotator  cuff repair (4 1 00); and left shoulder surgery .   Her family history includes Cancer in her sister; Heart disease (age of onset: 79) in her father.She reports that she has quit smoking. Her smoking use included Cigarettes. She has a 0.50 pack-year smoking history. She has never used smokeless tobacco. She reports that she does not drink alcohol or use drugs.    ROS Review of Systems  Constitutional: Negative for activity change, appetite change and fever.  HENT: Negative for congestion, rhinorrhea and sore throat.   Eyes: Negative for visual disturbance.  Respiratory: Positive for cough. Negative for shortness of breath and wheezing.   Cardiovascular: Negative for chest pain and palpitations.  Gastrointestinal: Negative for abdominal pain, diarrhea and nausea.  Genitourinary: Negative for dysuria.  Musculoskeletal: Positive for arthralgias (left knee). Negative for myalgias.    Objective:  BP 113/62   Pulse 72   Temp 98.6 F (37 C) (Oral)   Ht 5' (1.524 m)   Wt 139 lb (63 kg)   SpO2 97%   BMI 27.15 kg/m   BP Readings from Last 3 Encounters:  06/25/16 113/62  06/21/16 (!) 106/58  03/29/16 (!) 167/71     Wt Readings from Last 3 Encounters:  06/25/16 139 lb (63 kg)  06/21/16 141 lb (64 kg)  03/29/16 132 lb (59.9 kg)     Physical Exam  Constitutional: She is oriented to person, place, and time. She appears well-developed and well-nourished. No distress.  HENT:  Head: Normocephalic and atraumatic.  Eyes: Conjunctivae are normal. Pupils are equal, round, and reactive to light.  Neck: Normal range of motion. Neck supple. No thyromegaly present.  Cardiovascular: Normal rate, regular rhythm and normal heart sounds.   No murmur heard. Pulmonary/Chest: Effort normal and breath sounds normal. No respiratory distress. She has no wheezes. She has no rales.  Abdominal: Soft. There is no tenderness.  Musculoskeletal: Normal range of motion.  Lymphadenopathy:    She has no cervical adenopathy.  Neurological: She is alert and oriented to person, place, and time.  Skin: Skin is warm and dry.  Psychiatric: She has a normal mood and affect. Her behavior is normal. Judgment and thought content normal.      Assessment & Plan:   [name] was seen today for follow-up.  Diagnoses and all orders for this visit:  Mild intermittent asthmatic bronchitis with acute exacerbation       I am having [name] maintain her budesonide-formoterol, multivitamin with minerals, losartan, metoprolol succinate, levothyroxine, donepezil, furosemide, busPIRone, amiodarone, albuterol, albuterol, amoxicillin-clavulanate, methylPREDNISolone, memantine, omeprazole, and sodium chloride.  Allergies as of 06/25/2016      Reactions   Ace Inhibitors Cough   Zocor [simvastatin] Other (See Comments)   Leg pain  Medication List       Accurate as of 06/25/16  4:10 PM. Always use your most recent med list.          albuterol (2.5 MG/3ML) 0.083% nebulizer solution Commonly known as:  PROVENTIL Take 3 mLs (2.5 mg total) by nebulization every 6 (six) hours as needed for wheezing or shortness of breath.    albuterol 108 (90 Base) MCG/ACT inhaler Commonly known as:  PROAIR HFA Inhale 2 puffs into the lungs every 6 (six) hours as needed.   amiodarone 200 MG tablet Commonly known as:  PACERONE TAKE (1/2) TABLET DAILY.   amoxicillin-clavulanate 875-125 MG tablet Commonly known as:  AUGMENTIN Take 1 tablet by mouth 2 (two) times daily. Take all of this medication   budesonide-formoterol 80-4.5 MCG/ACT inhaler Commonly known as:  SYMBICORT Inhale 2 puffs into the lungs 2 (two) times daily.   busPIRone 5 MG tablet Commonly known as:  BUSPAR TAKE ONE TABLET AT BEDTIME   donepezil 10 MG tablet Commonly known as:  ARICEPT TAKE 1 TABLET DAILY   furosemide 20 MG tablet Commonly known as:  LASIX TAKE (1) TABLET A DAY AS DIRECTED.   levothyroxine 75 MCG tablet Commonly known as:  SYNTHROID, LEVOTHROID Take 1 tablet (75 mcg total) by mouth daily.   losartan 100 MG tablet Commonly known as:  COZAAR TAKE 1 TABLET DAILY   memantine 10 MG tablet Commonly known as:  NAMENDA TAKE  (1)  TABLET TWICE A DAY.   methylPREDNISolone 4 MG Tbpk tablet Commonly known as:  MEDROL DOSEPAK Start with 6 on the first day and take one less each day until finished   metoprolol succinate 25 MG 24 hr tablet Commonly known as:  TOPROL-XL TAKE 1 TABLET DAILY   multivitamin with minerals Tabs tablet Take 1 tablet by mouth daily.   omeprazole 20 MG capsule Commonly known as:  PRILOSEC TAKE (1) CAPSULE TWICE DAILY.   sodium chloride 0.65 % nasal spray Commonly known as:  OCEAN 1 spray by Both Nostrils route as needed for Congestion.        Follow-up: Return if symptoms worsen or fail to improve.  Mechele ClaudeWarren Lorali Khamis, M.D.

## 2016-06-30 ENCOUNTER — Other Ambulatory Visit: Payer: Self-pay | Admitting: Family Medicine

## 2016-07-08 ENCOUNTER — Other Ambulatory Visit: Payer: Self-pay

## 2016-07-08 ENCOUNTER — Other Ambulatory Visit: Payer: Self-pay | Admitting: Family Medicine

## 2016-07-08 ENCOUNTER — Encounter: Payer: Self-pay | Admitting: Family

## 2016-07-08 ENCOUNTER — Ambulatory Visit (INDEPENDENT_AMBULATORY_CARE_PROVIDER_SITE_OTHER): Payer: Medicare Other | Admitting: Family

## 2016-07-08 VITALS — BP 167/72 | HR 68 | Temp 97.9°F | Ht 60.0 in | Wt 146.0 lb

## 2016-07-08 DIAGNOSIS — M79605 Pain in left leg: Secondary | ICD-10-CM

## 2016-07-08 DIAGNOSIS — I509 Heart failure, unspecified: Secondary | ICD-10-CM | POA: Diagnosis not present

## 2016-07-08 DIAGNOSIS — M7989 Other specified soft tissue disorders: Secondary | ICD-10-CM

## 2016-07-08 DIAGNOSIS — R609 Edema, unspecified: Secondary | ICD-10-CM | POA: Diagnosis not present

## 2016-07-08 MED ORDER — FUROSEMIDE 20 MG PO TABS: ORAL_TABLET | ORAL | 2 refills | Status: DC

## 2016-07-08 NOTE — Progress Notes (Addendum)
   Subjective:    Patient ID: [name], female    DOB: [birth year], 83 y.o.   MRN: [id number]  HPI Pt presents to the office today with left leg swelling that she noticed today. Pt states she has been having right knee pain and was using a heating pad today and then noticed her left knee, calf, and ankle were swollen. PT reports an aching 8 out 10. Nursing aid reports 6 lbs weight gain over the last 5 days.    Review of Systems  Cardiovascular: Positive for leg swelling.  Musculoskeletal: Positive for arthralgias.  All other systems reviewed and are negative.      Objective:   Physical Exam  Constitutional: She is oriented to person, place, and time. She appears well-developed and well-nourished. No distress.  HENT:  Head: Normocephalic.  Eyes: Pupils are equal, round, and reactive to light.  Neck: Normal range of motion. Neck supple. No thyromegaly present.  Cardiovascular: Normal rate, regular rhythm, normal heart sounds and intact distal pulses.   No murmur heard. Pulmonary/Chest: Effort normal and breath sounds normal. No respiratory distress. She has no wheezes.  Abdominal: Soft. Bowel sounds are normal. She exhibits no distension. There is no tenderness.  Musculoskeletal: She exhibits edema (trace in left leg). She exhibits no tenderness.  Using walker  Neurological: She is alert and oriented to person, place, and time.  Skin: Skin is warm and dry.  Psychiatric: She has a normal mood and affect. Her behavior is normal. Judgment and thought content normal.  Vitals reviewed.     BP (!) 167/72   Pulse 68   Temp 97.9 F (36.6 C) (Oral)   Ht 5' (1.524 m)   Wt 146 lb (66.2 kg)   BMI 28.51 kg/m      Assessment & Plan:  1. Left leg swelling - furosemide (LASIX) 20 MG tablet; TAKE 2 TABLET A DAY AS DIRECTED.  Dispense: 60 tablet; Refill: 2 - Ultrasound doppler venous legs bilat; Future  2. Congestive heart failure, unspecified congestive heart failure  chronicity, unspecified congestive heart failure type (HCC)  3. Peripheral edema   Will order doppler to rule out DVT I do not believe this is a DVT but more CHF Increase lasix to 40 mg for next few days until decrease fluid weight Low salt diet Compression hose Continue weighing daily RTO prn  And keep chronic follow up   Jannifer Rodneyhristy Kaori Jumper, FNP

## 2016-07-08 NOTE — Patient Instructions (Signed)

## 2016-07-09 ENCOUNTER — Ambulatory Visit (HOSPITAL_COMMUNITY): Payer: Self-pay

## 2016-07-09 ENCOUNTER — Ambulatory Visit (HOSPITAL_COMMUNITY)
Admission: RE | Admit: 2016-07-09 | Discharge: 2016-07-09 | Disposition: A | Discharge status: Home / Self Care | Payer: Medicare Other | Source: Ambulatory Visit | Attending: Family | Admitting: Family

## 2016-07-09 DIAGNOSIS — M79605 Pain in left leg: Secondary | ICD-10-CM | POA: Diagnosis not present

## 2016-07-29 ENCOUNTER — Other Ambulatory Visit: Payer: Self-pay | Admitting: Family Medicine

## 2016-08-02 ENCOUNTER — Ambulatory Visit (INDEPENDENT_AMBULATORY_CARE_PROVIDER_SITE_OTHER): Payer: Medicare Other | Admitting: Family Medicine

## 2016-08-02 ENCOUNTER — Encounter: Payer: Self-pay | Admitting: Family Medicine

## 2016-08-02 ENCOUNTER — Ambulatory Visit (INDEPENDENT_AMBULATORY_CARE_PROVIDER_SITE_OTHER): Payer: Medicare Other

## 2016-08-02 VITALS — BP 123/62 | HR 64 | Temp 98.4°F | Ht 60.0 in | Wt 141.0 lb

## 2016-08-02 DIAGNOSIS — M25562 Pain in left knee: Secondary | ICD-10-CM

## 2016-08-02 DIAGNOSIS — G8929 Other chronic pain: Secondary | ICD-10-CM | POA: Diagnosis not present

## 2016-08-02 DIAGNOSIS — I10 Essential (primary) hypertension: Secondary | ICD-10-CM | POA: Diagnosis not present

## 2016-08-02 DIAGNOSIS — E559 Vitamin D deficiency, unspecified: Secondary | ICD-10-CM

## 2016-08-02 DIAGNOSIS — R911 Solitary pulmonary nodule: Secondary | ICD-10-CM | POA: Diagnosis not present

## 2016-08-02 DIAGNOSIS — I480 Paroxysmal atrial fibrillation: Secondary | ICD-10-CM

## 2016-08-02 DIAGNOSIS — E7800 Pure hypercholesterolemia, unspecified: Secondary | ICD-10-CM

## 2016-08-02 DIAGNOSIS — I509 Heart failure, unspecified: Secondary | ICD-10-CM

## 2016-08-02 DIAGNOSIS — K219 Gastro-esophageal reflux disease without esophagitis: Secondary | ICD-10-CM | POA: Diagnosis not present

## 2016-08-02 DIAGNOSIS — E034 Atrophy of thyroid (acquired): Secondary | ICD-10-CM | POA: Diagnosis not present

## 2016-08-02 NOTE — Progress Notes (Signed)
Subjective:    Patient ID: [name], female    DOB: 04-09-1925, 83 y.o.   MRN: [id number]  HPI Pt here for follow up and management of chronic medical problems which includes hypothyroid, hyperlipidemia and hypertension. She is taking medication regularly.The patient complains with some left knee pain. She also has some edema that she is concerned about. She will get lab work today. She comes with her niece to the visit today. This patient has a history of congestive heart failure in the past and atrial fibrillation and hypothyroidism. She also has memory impairment. She has a history of a right lung nodule that was supposed to be followed yearly with CT scans. This was ordered by Dr. Chase Caller. The last CT scan that I see on record was in 2015. The patient is still complaining with left knee pain and has had lower extremity Dopplers that were negative for DVT. She is using a walker. She denies any chest pain or shortness of breath more than usual. She is passing her water well without problems she is having no problems with her intestinal tract including nausea vomiting diarrhea or blood in the stool. We will schedule her also for a CT scan of the lung because of this history of a pulmonary nodule and the need to have yearly CT scans. Patient is fairly alert and has a good sense of humor today.    Patient Active Problem List   Diagnosis Date Noted  . Hypothyroidism 03/31/2015  . CHF (congestive heart failure) ([place]) 11/15/2014  . Absolute anemia 10/18/2014  . Bruising, spontaneous 09/16/2014  . Long term current use of anticoagulant 09/16/2014  . Atrial fibrillation ([place]) 04/15/2014  . Edema 04/15/2014  . Nodule of right lung 05/27/2013  . Memory impairment 03/12/2013  . Prophylactic immunotherapy 10/24/2012  . Dyspnea 07/06/2012  . Extrinsic asthma 08/31/2010  . Atrophic vaginitis   . Malaise and fatigue   . Thoracic scoliosis   . Insomnia, idiopathic   . Gastritis   .  Gastroesophageal reflux disease with hiatal hernia   . Colon polyps   . Diverticulosis   . Osteoarthrosis and allied disorders   . Hyperlipidemia 02/11/2009  . Essential hypertension 02/10/2009   Outpatient Encounter Prescriptions as of 08/02/2016  Medication Sig  . albuterol (PROAIR HFA) 108 (90 Base) MCG/ACT inhaler Inhale 2 puffs into the lungs every 6 (six) hours as needed.  Marland Kitchen albuterol (PROVENTIL) (2.5 MG/3ML) 0.083% nebulizer solution Take 3 mLs (2.5 mg total) by nebulization every 6 (six) hours as needed for wheezing or shortness of breath.  Marland Kitchen amiodarone (PACERONE) 200 MG tablet TAKE (1/2) TABLET DAILY.  . budesonide-formoterol (SYMBICORT) 80-4.5 MCG/ACT inhaler Inhale 2 puffs into the lungs 2 (two) times daily.  . busPIRone (BUSPAR) 5 MG tablet TAKE ONE TABLET AT BEDTIME  . donepezil (ARICEPT) 10 MG tablet TAKE 1 TABLET DAILY  . furosemide (LASIX) 20 MG tablet TAKE 2 TABLET A DAY AS DIRECTED.  Marland Kitchen levothyroxine (SYNTHROID, LEVOTHROID) 75 MCG tablet Take 1 tablet (75 mcg total) by mouth daily.  Marland Kitchen losartan (COZAAR) 100 MG tablet TAKE 1 TABLET DAILY  . memantine (NAMENDA) 10 MG tablet TAKE  (1)  TABLET TWICE A DAY.  . metoprolol succinate (TOPROL-XL) 25 MG 24 hr tablet TAKE 1 TABLET DAILY  . Multiple Vitamin (MULTIVITAMIN WITH MINERALS) TABS tablet Take 1 tablet by mouth daily.  Marland Kitchen omeprazole (PRILOSEC) 20 MG capsule TAKE (1) CAPSULE TWICE DAILY.  . sodium chloride (OCEAN) 0.65 % nasal spray 1  spray by Both Nostrils route as needed for Congestion.   No facility-administered encounter medications on file as of 08/02/2016.       Review of Systems  Constitutional: Negative.   HENT: Negative.   Eyes: Negative.   Respiratory: Negative.   Cardiovascular: Positive for leg swelling.  Gastrointestinal: Negative.   Endocrine: Negative.   Genitourinary: Negative.   Musculoskeletal: Positive for arthralgias (left knee pain ).  Skin: Negative.   Allergic/Immunologic: Negative.     Neurological: Negative.   Hematological: Negative.   Psychiatric/Behavioral: Negative.        Objective:   Physical Exam  Constitutional: She is oriented to person, place, and time. She appears well-developed and well-nourished. No distress.  The patient is fairly alert today and does have a good sense of humor.  HENT:  Head: Normocephalic and atraumatic.  Nose: Nose normal.  Mouth/Throat: Oropharynx is clear and moist.  Ears cerumen bilaterally  Eyes: Conjunctivae and EOM are normal. Pupils are equal, round, and reactive to light. Right eye exhibits no discharge. Left eye exhibits no discharge. No scleral icterus.  Neck: Normal range of motion. Neck supple. No thyromegaly present.  Good range of motion.  Cardiovascular: Normal rate, regular rhythm and normal heart sounds.   No murmur heard. Distal pulses were difficult to palpate. The heart is regular at 72/m.  Pulmonary/Chest: Effort normal and breath sounds normal. No respiratory distress. She has no wheezes. She has no rales.  The lungs are clear anteriorly and posteriorly  Abdominal: Soft. Bowel sounds are normal. She exhibits no mass. There is no tenderness. There is no rebound and no guarding.  No abdominal tenderness masses or organ enlargement palpable.  Musculoskeletal: She exhibits tenderness. She exhibits no edema.  The patient uses a walker for ambulation. She is tender at the medial joint line of the left knee with palpation. There is minimal swelling and minimal edema of that leg.  Lymphadenopathy:    She has no cervical adenopathy.  Neurological: She is alert and oriented to person, place, and time. She has normal reflexes. No cranial nerve deficit.  Skin: Skin is warm and dry. No rash noted.  Psychiatric: She has a normal mood and affect. Her behavior is normal. Judgment and thought content normal.  Nursing note and vitals reviewed.   BP 123/62 (BP Location: Right Arm)   Pulse 64   Temp 98.4 F (36.9 C) (Oral)    Ht 5' (1.524 m)   Wt 141 lb (64 kg)   BMI 27.54 kg/m        Assessment & Plan:  1. Congestive heart failure, unspecified congestive heart failure chronicity, unspecified congestive heart failure type ([place]) -The patient's heart seems to be stable. The rhythm is regular today and she has no complaints with chest pain. - CBC with Differential/Platelet  2. Essential hypertension -The blood pressure is good today and she will continue with current treatment - CBC with Differential/Platelet - BMP8+EGFR - Hepatic function panel  3. Pure hypercholesterolemia -Continue with aggressive therapeutic lifestyle changes as much as possible - CBC with Differential/Platelet - Lipid panel  4. Hypothyroidism due to acquired atrophy of thyroid -Continue with current thyroid treatment - CBC with Differential/Platelet  5. Paroxysmal atrial fibrillation (HCC) -The rhythm was regular today and no change in treatment - CBC with Differential/Platelet  6. Vitamin D deficiency -Continue with vitamin D replacement pending results of lab work - CBC with Differential/Platelet - VITAMIN D 25 Hydroxy (Vit-D Deficiency, Fractures)  7. Gastroesophageal reflux disease,  esophagitis presence not specified -Continue with omeprazole - CBC with Differential/Platelet  8. Pulmonary nodule -The patient was supposed to have a repeat chest CT over 2 years ago. She did not get this for some reason. We will schedule this today. - CT Chest Wo Contrast; Future  9. Chronic pain of left knee -Orthopedic referral and x-rays of knee today. - DG Knee 1-2 Views Left; Future  Patient Instructions                       Medicare Annual Wellness Visit  [facility] and the medical providers at [facility] strive to bring you the best medical care.  In doing so we not only want to address your current medical conditions and concerns but also to detect new conditions early and prevent illness, disease  and health-related problems.    Medicare offers a yearly Wellness Visit which allows our clinical staff to assess your need for preventative services including immunizations, lifestyle education, counseling to decrease risk of preventable diseases and screening for fall risk and other medical concerns.    This visit is provided free of charge (no copay) for all Medicare recipients. The clinical pharmacists at [facility] have begun to conduct these Wellness Visits which will also include a thorough review of all your medications.    As you primary medical provider recommend that you make an appointment for your Annual Wellness Visit if you have not done so already this year.  You may set up this appointment before you leave today or you may call back ([phone]) and schedule an appointment.  Please make sure when you call that you mention that you are scheduling your Annual Wellness Visit with the clinical pharmacist so that the appointment may be made for the proper length of time.     Continue current medications. Continue good therapeutic lifestyle changes which include good diet and exercise. Fall precautions discussed with patient. If an FOBT was given today- please return it to our front desk. If you are over 48 years old - you may need Prevnar 12 or the adult Pneumonia vaccine.  **Flu shots are available--- please call and schedule a FLU-CLINIC appointment**  After your visit with [place] today you will receive a survey in the mail or online from [employer] regarding your care with [place]. Please take a moment to fill this out. Your feedback is very important to [place] as you can help [place] better understand your patient needs as well as improve your experience and satisfaction. WE CARE ABOUT YOU!!!   We will arrange for the patient to have a CT scan of the chest to follow-up on the pulmonary nodule. We will call with the results of the left knee films and we'll schedule her to see  the orthopedist that comes to our office when he is here at the next visit. She should drink plenty of fluids and take Tylenol if needed for pain.    Arrie Senate MD

## 2016-08-02 NOTE — Addendum Note (Signed)
Addended by: Magdalene River on: 08/02/2016 12:41 PM   Modules accepted: Orders

## 2016-08-02 NOTE — Patient Instructions (Addendum)
Medicare Annual Wellness Visit  [facility] and the medical providers at [facility] strive to bring you the best medical care.  In doing so we not only want to address your current medical conditions and concerns but also to detect new conditions early and prevent illness, disease and health-related problems.    Medicare offers a yearly Wellness Visit which allows our clinical staff to assess your need for preventative services including immunizations, lifestyle education, counseling to decrease risk of preventable diseases and screening for fall risk and other medical concerns.    This visit is provided free of charge (no copay) for all Medicare recipients. The clinical pharmacists at [facility] have begun to conduct these Wellness Visits which will also include a thorough review of all your medications.    As you primary medical provider recommend that you make an appointment for your Annual Wellness Visit if you have not done so already this year.  You may set up this appointment before you leave today or you may call back ([phone]) and schedule an appointment.  Please make sure when you call that you mention that you are scheduling your Annual Wellness Visit with the clinical pharmacist so that the appointment may be made for the proper length of time.     Continue current medications. Continue good therapeutic lifestyle changes which include good diet and exercise. Fall precautions discussed with patient. If an FOBT was given today- please return it to our front desk. If you are over 30 years old - you may need Prevnar 13 or the adult Pneumonia vaccine.  **Flu shots are available--- please call and schedule a FLU-CLINIC appointment**  After your visit with [place] today you will receive a survey in the mail or online from [employer] regarding your care with [place]. Please take a moment to fill this out. Your feedback is very  important to [place] as you can help [place] better understand your patient needs as well as improve your experience and satisfaction. WE CARE ABOUT YOU!!!   We will arrange for the patient to have a CT scan of the chest to follow-up on the pulmonary nodule. We will call with the results of the left knee films and we'll schedule her to see the orthopedist that comes to our office when he is here at the next visit. She should drink plenty of fluids and take Tylenol if needed for pain.

## 2016-08-03 LAB — CBC WITH DIFFERENTIAL/PLATELET
Basophils Absolute: 0 10*3/uL (ref 0.0–0.2)
Basos: 1 %
EOS (ABSOLUTE): 0 10*3/uL (ref 0.0–0.4)
Eos: 0 %
HEMATOCRIT: 34.5 % (ref 34.0–46.6)
HEMOGLOBIN: 11.3 g/dL (ref 11.1–15.9)
Immature Grans (Abs): 0 10*3/uL (ref 0.0–0.1)
Immature Granulocytes: 0 %
LYMPHS ABS: 1.7 10*3/uL (ref 0.7–3.1)
LYMPHS: 25 %
MCH: 28.6 pg (ref 26.6–33.0)
MCHC: 32.8 g/dL (ref 31.5–35.7)
MCV: 87 fL (ref 79–97)
Monocytes Absolute: 0.8 10*3/uL (ref 0.1–0.9)
Monocytes: 12 %
Neutrophils Absolute: 4.2 10*3/uL (ref 1.4–7.0)
Neutrophils: 62 %
Platelets: 270 10*3/uL (ref 150–379)
RBC: 3.95 x10E6/uL (ref 3.77–5.28)
RDW: 15.4 % (ref 12.3–15.4)
WBC: 6.7 10*3/uL (ref 3.4–10.8)

## 2016-08-03 LAB — HEPATIC FUNCTION PANEL
ALBUMIN: 4.1 g/dL (ref 3.5–4.7)
ALK PHOS: 69 IU/L (ref 39–117)
ALT: 13 IU/L (ref 0–32)
AST: 22 IU/L (ref 0–40)
BILIRUBIN TOTAL: 0.3 mg/dL (ref 0.0–1.2)
BILIRUBIN, DIRECT: 0.1 mg/dL (ref 0.00–0.40)
TOTAL PROTEIN: 6.7 g/dL (ref 6.0–8.5)

## 2016-08-03 LAB — VITAMIN D 25 HYDROXY (VIT D DEFICIENCY, FRACTURES): Vit D, 25-Hydroxy: 56.2 ng/mL (ref 30.0–100.0)

## 2016-08-03 LAB — LIPID PANEL
Chol/HDL Ratio: 3.5 ratio (ref 0.0–4.4)
Cholesterol, Total: 202 mg/dL — ABNORMAL HIGH (ref 100–199)
HDL: 58 mg/dL (ref >39)
LDL CALC: 107 mg/dL — AB (ref 0–99)
Triglycerides: 185 mg/dL — ABNORMAL HIGH (ref 0–149)
VLDL CHOLESTEROL CAL: 37 mg/dL (ref 5–40)

## 2016-08-03 LAB — BMP8+EGFR
BUN/Creatinine Ratio: 20 (ref 12–28)
BUN: 24 mg/dL (ref 8–27)
CALCIUM: 9.5 mg/dL (ref 8.7–10.3)
CO2: 29 mmol/L (ref 18–29)
CREATININE: 1.19 mg/dL — AB (ref 0.57–1.00)
Chloride: 102 mmol/L (ref 96–106)
GFR calc Af Amer: 47 mL/min/{1.73_m2} — ABNORMAL LOW (ref >59)
GFR, EST NON AFRICAN AMERICAN: 41 mL/min/{1.73_m2} — AB (ref >59)
Glucose: 80 mg/dL (ref 65–99)
Potassium: 4.9 mmol/L (ref 3.5–5.2)
Sodium: 143 mmol/L (ref 134–144)

## 2016-08-09 ENCOUNTER — Other Ambulatory Visit: Payer: Self-pay | Admitting: Family Medicine

## 2016-08-17 ENCOUNTER — Ambulatory Visit (HOSPITAL_COMMUNITY)
Admission: RE | Admit: 2016-08-17 | Discharge: 2016-08-17 | Disposition: A | Discharge status: Home / Self Care | Payer: Medicare Other | Source: Ambulatory Visit | Attending: Family Medicine | Admitting: Family Medicine

## 2016-08-17 DIAGNOSIS — J479 Bronchiectasis, uncomplicated: Secondary | ICD-10-CM | POA: Diagnosis not present

## 2016-08-17 DIAGNOSIS — R911 Solitary pulmonary nodule: Secondary | ICD-10-CM | POA: Insufficient documentation

## 2016-08-17 DIAGNOSIS — I2510 Atherosclerotic heart disease of native coronary artery without angina pectoris: Secondary | ICD-10-CM | POA: Diagnosis not present

## 2016-09-02 ENCOUNTER — Other Ambulatory Visit: Payer: Self-pay | Admitting: Family Medicine

## 2016-09-27 ENCOUNTER — Other Ambulatory Visit: Payer: Self-pay | Admitting: Family Medicine

## 2016-10-09 ENCOUNTER — Other Ambulatory Visit: Payer: Self-pay | Admitting: Family Medicine

## 2016-10-09 ENCOUNTER — Other Ambulatory Visit: Payer: Self-pay | Admitting: Family

## 2016-10-09 DIAGNOSIS — M7989 Other specified soft tissue disorders: Secondary | ICD-10-CM

## 2016-10-28 ENCOUNTER — Other Ambulatory Visit: Payer: Self-pay | Admitting: Family Medicine

## 2016-10-29 ENCOUNTER — Other Ambulatory Visit: Payer: Self-pay | Admitting: Family Medicine

## 2016-11-02 ENCOUNTER — Encounter: Payer: Self-pay | Admitting: *Deleted

## 2016-11-16 ENCOUNTER — Encounter: Payer: Self-pay | Admitting: *Deleted

## 2016-11-24 ENCOUNTER — Other Ambulatory Visit: Payer: Self-pay | Admitting: Family Medicine

## 2016-11-25 NOTE — Telephone Encounter (Signed)
Last seen 08/02/16  DWM

## 2016-12-20 ENCOUNTER — Ambulatory Visit: Payer: Medicare Other | Admitting: Family Medicine

## 2016-12-23 ENCOUNTER — Other Ambulatory Visit: Payer: Self-pay | Admitting: Family Medicine

## 2017-01-03 ENCOUNTER — Ambulatory Visit: Payer: Medicare Other | Admitting: Family Medicine

## 2017-01-04 ENCOUNTER — Ambulatory Visit: Payer: Medicare Other | Admitting: Family Medicine

## 2017-01-17 ENCOUNTER — Other Ambulatory Visit: Payer: Self-pay | Admitting: Family Medicine

## 2017-01-17 ENCOUNTER — Encounter: Payer: Self-pay | Admitting: Family Medicine

## 2017-01-17 ENCOUNTER — Ambulatory Visit (INDEPENDENT_AMBULATORY_CARE_PROVIDER_SITE_OTHER): Payer: Medicare Other | Admitting: Family Medicine

## 2017-01-17 VITALS — BP 142/66 | HR 60 | Temp 97.0°F | Ht 60.0 in | Wt 141.0 lb

## 2017-01-17 DIAGNOSIS — Z23 Encounter for immunization: Secondary | ICD-10-CM

## 2017-01-17 DIAGNOSIS — I480 Paroxysmal atrial fibrillation: Secondary | ICD-10-CM | POA: Diagnosis not present

## 2017-01-17 DIAGNOSIS — E7800 Pure hypercholesterolemia, unspecified: Secondary | ICD-10-CM

## 2017-01-17 DIAGNOSIS — I10 Essential (primary) hypertension: Secondary | ICD-10-CM | POA: Diagnosis not present

## 2017-01-17 DIAGNOSIS — K219 Gastro-esophageal reflux disease without esophagitis: Secondary | ICD-10-CM | POA: Diagnosis not present

## 2017-01-17 DIAGNOSIS — H6121 Impacted cerumen, right ear: Secondary | ICD-10-CM

## 2017-01-17 DIAGNOSIS — E034 Atrophy of thyroid (acquired): Secondary | ICD-10-CM

## 2017-01-17 DIAGNOSIS — E559 Vitamin D deficiency, unspecified: Secondary | ICD-10-CM | POA: Diagnosis not present

## 2017-01-17 NOTE — Patient Instructions (Addendum)
Medicare Annual Wellness Visit  [facility] and the medical providers at [facility] strive to bring you the best medical care.  In doing so we not only want to address your current medical conditions and concerns but also to detect new conditions early and prevent illness, disease and health-related problems.    Medicare offers a yearly Wellness Visit which allows our clinical staff to assess your need for preventative services including immunizations, lifestyle education, counseling to decrease risk of preventable diseases and screening for fall risk and other medical concerns.    This visit is provided free of charge (no copay) for all Medicare recipients. The clinical pharmacists at [facility] have begun to conduct these Wellness Visits which will also include a thorough review of all your medications.    As you primary medical provider recommend that you make an appointment for your Annual Wellness Visit if you have not done so already this year.  You may set up this appointment before you leave today or you may call back [phone]) and schedule an appointment.  Please make sure when you call that you mention that you are scheduling your Annual Wellness Visit with the clinical pharmacist so that the appointment may be made for the proper length of time.     Continue current medications. Continue good therapeutic lifestyle changes which include good diet and exercise. Fall precautions discussed with patient. If an FOBT was given today- please return it to our front desk. If you are over 55 years old - you may need Prevnar 13 or the adult Pneumonia vaccine.  **Flu shots are available--- please call and schedule a FLU-CLINIC appointment**  After your visit with [place] today you will receive a survey in the mail or online from [employer] regarding your care with [place]. Please take a moment to fill this out. Your feedback is very  important to [place] as you can help [place] better understand your patient needs as well as improve your experience and satisfaction. WE CARE ABOUT YOU!!!   Follow-up with Dr. Selena Batten as planned and get that appointment as soon as possible if she is past due for that appointment Flu shot and she receives today may make her arm sore She should be encouraged to drink plenty of fluids and stay well hydrated She should use her Symbicort inhaler 2 puffs twice daily on a regular basis and rinse her mouth after using Use Debrox periodically to soften earwax so that irrigation can be more successful

## 2017-01-17 NOTE — Progress Notes (Signed)
Subjective:    Patient ID: [name], female    DOB: [birth year], 83 y.o.   MRN: [id number]  HPI Pt here for follow up and management of chronic medical problems which includes hypothyroid, hypertension and hyperlipidemia. She is taking medication regularly.The patient comes to the visit today with her niece. She has no complaints. She is past due to her visit to the cardiologist because of atrial fibrillation. She also has had some problems with a trending downward of her hemoglobin and positive stools and is currently not taking any thing for anticoagulation. According to the cardiology note if her hemoglobin stabilized he would consider restarting Xarelto or possibly Eliquis. She will be given an FOBT to to return today and she will also get a CBC as part of her lab work. A recent chest CT from April of this year was reviewed and she has progressing bronchiectasis. No acute pulmonary process was identified and she does have aortic atherosclerosis and stable and benign right lung nodules. The patient is smiling and feeling well. Her niece says that mentally that she seems to be stable with her memory. The patient denies any chest pain or shortness of breath. She denies any trouble with swallowing heartburn indigestion nausea vomiting diarrhea or blood in the stool. She has not had any black tarry bowel movements. She is passing her water without problems. She is becoming a little bit more reclusive. She also has to be encouraged to drink more water and stay well hydrated. She also needs more encouragement with getting out with assistance and supervision.      Patient Active Problem List   Diagnosis Date Noted  . Hypothyroidism 03/31/2015  . CHF (congestive heart failure) ([place]) 11/15/2014  . Absolute anemia 10/18/2014  . Bruising, spontaneous 09/16/2014  . Long term current use of anticoagulant 09/16/2014  . Atrial fibrillation ([place]) 04/15/2014  . Edema 04/15/2014  . Nodule of right  lung 05/27/2013  . Memory impairment 03/12/2013  . Prophylactic immunotherapy 10/24/2012  . Dyspnea 07/06/2012  . Extrinsic asthma 08/31/2010  . Atrophic vaginitis   . Malaise and fatigue   . Thoracic scoliosis   . Insomnia, idiopathic   . Gastritis   . Gastroesophageal reflux disease with hiatal hernia   . Colon polyps   . Diverticulosis   . Osteoarthrosis and allied disorders   . Hyperlipidemia 02/11/2009  . Essential hypertension 02/10/2009   Outpatient Encounter Prescriptions as of 01/17/2017  Medication Sig  . albuterol (PROAIR HFA) 108 (90 Base) MCG/ACT inhaler Inhale 2 puffs into the lungs every 6 (six) hours as needed.  Marland Kitchen albuterol (PROVENTIL) (2.5 MG/3ML) 0.083% nebulizer solution Take 3 mLs (2.5 mg total) by nebulization every 6 (six) hours as needed for wheezing or shortness of breath.  Marland Kitchen amiodarone (PACERONE) 200 MG tablet TAKE (1/2) TABLET DAILY.  Marland Kitchen Biotin 5 MG TABS Take by mouth.  . budesonide-formoterol (SYMBICORT) 80-4.5 MCG/ACT inhaler Inhale 2 puffs into the lungs 2 (two) times daily.  . busPIRone (BUSPAR) 5 MG tablet TAKE ONE TABLET AT BEDTIME  . calcium-vitamin D (OSCAL WITH D) 500-200 MG-UNIT tablet Take 1 tablet by mouth.  . donepezil (ARICEPT) 10 MG tablet TAKE 1 TABLET DAILY  . furosemide (LASIX) 20 MG tablet TAKE 2 TABLET A DAY AS DIRECTED.  Marland Kitchen levothyroxine (SYNTHROID, LEVOTHROID) 75 MCG tablet Take 1 tablet (75 mcg total) by mouth daily.  Marland Kitchen losartan (COZAAR) 100 MG tablet TAKE 1 TABLET DAILY  . memantine (NAMENDA) 10 MG tablet TAKE  (1)  TABLET TWICE A DAY.  . metoprolol succinate (TOPROL-XL) 25 MG 24 hr tablet TAKE 1 TABLET DAILY  . Multiple Vitamin (MULTIVITAMIN WITH MINERALS) TABS tablet Take 1 tablet by mouth daily.  Marland Kitchen omeprazole (PRILOSEC) 20 MG capsule TAKE (1) CAPSULE TWICE DAILY.  Marland Kitchen potassium chloride SA (K-DUR,KLOR-CON) 20 MEQ tablet Take 1 tablet (20 mEq total) by mouth daily.  . sodium chloride (OCEAN) 0.65 % nasal spray 1 spray by Both Nostrils  route as needed for Congestion.   No facility-administered encounter medications on file as of 01/17/2017.       Review of Systems  Constitutional: Negative.   HENT: Negative.   Eyes: Negative.   Respiratory: Negative.   Cardiovascular: Negative.   Gastrointestinal: Negative.   Endocrine: Negative.   Genitourinary: Negative.   Musculoskeletal: Negative.   Skin: Negative.   Allergic/Immunologic: Negative.   Neurological: Negative.   Hematological: Negative.   Psychiatric/Behavioral: Negative.        Objective:   Physical Exam  Constitutional: She is oriented to person, place, and time. She appears well-developed and well-nourished. No distress.  HENT:  Head: Normocephalic and atraumatic.  Left Ear: External ear normal.  Nose: Nose normal.  Mouth/Throat: Oropharynx is clear and moist.  ear cerumen right ear canal  Eyes: Pupils are equal, round, and reactive to light. Conjunctivae and EOM are normal. Right eye exhibits no discharge. Left eye exhibits no discharge. No scleral icterus.  Neck: Normal range of motion. Neck supple. No thyromegaly present.  No thyromegaly or bruits  Cardiovascular: Normal rate, regular rhythm and normal heart sounds.   No murmur heard. Pulmonary/Chest: Effort normal and breath sounds normal. No respiratory distress. She has no wheezes. She has no rales.  Clear anteriorly and posteriorly  Abdominal: Soft. Bowel sounds are normal. She exhibits no mass. There is no tenderness. There is no rebound and no guarding.  No abdominal tenderness masses or organ enlargement or bruits  Musculoskeletal: Normal range of motion. She exhibits no edema.  Lymphadenopathy:    She has no cervical adenopathy.  Neurological: She is alert and oriented to person, place, and time. She has normal reflexes. No cranial nerve deficit.  Skin: Skin is warm and dry. No rash noted.  Psychiatric: She has a normal mood and affect. Her behavior is normal. Judgment and thought  content normal.  The patient's memory issues do appear to be stable and she was able to carry on a good conversation and smile a lot during the conversation.  Nursing note and vitals reviewed.  BP (!) 142/66 (BP Location: Left Arm)   Pulse 60   Temp (!) 97 F (36.1 C) (Oral)   Ht 5' (1.524 m)   Wt 141 lb (64 kg)   BMI 27.54 kg/m         Assessment & Plan:  1. Essential hypertension -The systolic blood pressure is slightly elevated but there'll be no change in treatment today. - BMP8+EGFR - CBC with Differential/Platelet - Hepatic function panel  2. Pure hypercholesterolemia -Continue with aggressive therapeutic lifestyle changes which include diet and exercise as much as possible - CBC with Differential/Platelet - Lipid panel  3. Paroxysmal atrial fibrillation (HCC) -Heart was in normal sinus rhythm today at 72/m - CBC with Differential/Platelet  4. Hypothyroidism due to acquired atrophy of thyroid -Continue with current treatment pending results of lab work - CBC with Differential/Platelet - Thyroid Panel With TSH  5. Vitamin D deficiency -Continue with current treatment - CBC with Differential/Platelet - VITAMIN D  25 Hydroxy (Vit-D Deficiency, Fractures)  6. Gastroesophageal reflux disease, esophagitis presence not specified -Continue with omeprazole - CBC with Differential/Platelet - Hepatic function panel  7. cerumen right ear canal -Irrigation to remove cerumen  Patient Instructions                       Medicare Annual Wellness Visit  [facility] and the medical providers at [facility] strive to bring you the best medical care.  In doing so we not only want to address your current medical conditions and concerns but also to detect new conditions early and prevent illness, disease and health-related problems.    Medicare offers a yearly Wellness Visit which allows our clinical staff to assess your need for preventative services  including immunizations, lifestyle education, counseling to decrease risk of preventable diseases and screening for fall risk and other medical concerns.    This visit is provided free of charge (no copay) for all Medicare recipients. The clinical pharmacists at [facility] have begun to conduct these Wellness Visits which will also include a thorough review of all your medications.    As you primary medical provider recommend that you make an appointment for your Annual Wellness Visit if you have not done so already this year.  You may set up this appointment before you leave today or you may call back ([phone]) and schedule an appointment.  Please make sure when you call that you mention that you are scheduling your Annual Wellness Visit with the clinical pharmacist so that the appointment may be made for the proper length of time.     Continue current medications. Continue good therapeutic lifestyle changes which include good diet and exercise. Fall precautions discussed with patient. If an FOBT was given today- please return it to our front desk. If you are over 58 years old - you may need Prevnar 38 or the adult Pneumonia vaccine.  **Flu shots are available--- please call and schedule a FLU-CLINIC appointment**  After your visit with [place] today you will receive a survey in the mail or online from [employer] regarding your care with [place]. Please take a moment to fill this out. Your feedback is very important to [place] as you can help [place] better understand your patient needs as well as improve your experience and satisfaction. WE CARE ABOUT YOU!!!   Follow-up with Dr. Maudie Mercury as planned and get that appointment as soon as possible if she is past due for that appointment Flu shot and she receives today may make her arm sore She should be encouraged to drink plenty of fluids and stay well hydrated She should use her Symbicort inhaler 2 puffs twice daily on a regular basis and rinse  her mouth after using    Arrie Senate MD

## 2017-01-18 LAB — THYROID PANEL WITH TSH
Free Thyroxine Index: 2.5 (ref 1.2–4.9)
T3 UPTAKE RATIO: 28 % (ref 24–39)
T4 TOTAL: 8.9 ug/dL (ref 4.5–12.0)
TSH: 1.96 u[IU]/mL (ref 0.450–4.500)

## 2017-01-18 LAB — CBC WITH DIFFERENTIAL/PLATELET
BASOS ABS: 0 10*3/uL (ref 0.0–0.2)
Basos: 0 %
EOS (ABSOLUTE): 0.1 10*3/uL (ref 0.0–0.4)
Eos: 1 %
HEMOGLOBIN: 10.5 g/dL — AB (ref 11.1–15.9)
Hematocrit: 33.2 % — ABNORMAL LOW (ref 34.0–46.6)
IMMATURE GRANS (ABS): 0 10*3/uL (ref 0.0–0.1)
Immature Granulocytes: 0 %
LYMPHS ABS: 1.5 10*3/uL (ref 0.7–3.1)
LYMPHS: 24 %
MCH: 28.5 pg (ref 26.6–33.0)
MCHC: 31.6 g/dL (ref 31.5–35.7)
MCV: 90 fL (ref 79–97)
MONOCYTES: 10 %
Monocytes Absolute: 0.7 10*3/uL (ref 0.1–0.9)
Neutrophils Absolute: 4.1 10*3/uL (ref 1.4–7.0)
Neutrophils: 65 %
Platelets: 252 10*3/uL (ref 150–379)
RBC: 3.68 x10E6/uL — ABNORMAL LOW (ref 3.77–5.28)
RDW: 13.9 % (ref 12.3–15.4)
WBC: 6.4 10*3/uL (ref 3.4–10.8)

## 2017-01-18 LAB — HEPATIC FUNCTION PANEL
ALT: 15 IU/L (ref 0–32)
AST: 25 IU/L (ref 0–40)
Albumin: 4.2 g/dL (ref 3.5–4.7)
Alkaline Phosphatase: 74 IU/L (ref 39–117)
Bilirubin Total: 0.2 mg/dL (ref 0.0–1.2)
Bilirubin, Direct: 0.09 mg/dL (ref 0.00–0.40)
Total Protein: 6.4 g/dL (ref 6.0–8.5)

## 2017-01-18 LAB — BMP8+EGFR
BUN/Creatinine Ratio: 17 (ref 12–28)
BUN: 18 mg/dL (ref 8–27)
CALCIUM: 9 mg/dL (ref 8.7–10.3)
CHLORIDE: 102 mmol/L (ref 96–106)
CO2: 25 mmol/L (ref 20–29)
Creatinine, Ser: 1.08 mg/dL — ABNORMAL HIGH (ref 0.57–1.00)
GFR calc Af Amer: 53 mL/min/{1.73_m2} — ABNORMAL LOW (ref >59)
GFR calc non Af Amer: 46 mL/min/{1.73_m2} — ABNORMAL LOW (ref >59)
GLUCOSE: 105 mg/dL — AB (ref 65–99)
Potassium: 4.6 mmol/L (ref 3.5–5.2)
Sodium: 142 mmol/L (ref 134–144)

## 2017-01-18 LAB — LIPID PANEL
CHOLESTEROL TOTAL: 223 mg/dL — AB (ref 100–199)
Chol/HDL Ratio: 4.8 ratio — ABNORMAL HIGH (ref 0.0–4.4)
HDL: 46 mg/dL (ref >39)
LDL CALC: 101 mg/dL — AB (ref 0–99)
TRIGLYCERIDES: 382 mg/dL — AB (ref 0–149)
VLDL CHOLESTEROL CAL: 76 mg/dL — AB (ref 5–40)

## 2017-01-18 LAB — VITAMIN D 25 HYDROXY (VIT D DEFICIENCY, FRACTURES): Vit D, 25-Hydroxy: 39.5 ng/mL (ref 30.0–100.0)

## 2017-02-09 ENCOUNTER — Other Ambulatory Visit: Payer: Self-pay | Admitting: Family Medicine

## 2017-02-21 ENCOUNTER — Other Ambulatory Visit: Payer: Self-pay | Admitting: Family Medicine

## 2017-03-03 ENCOUNTER — Other Ambulatory Visit: Payer: Self-pay | Admitting: Family Medicine

## 2017-03-03 DIAGNOSIS — M7989 Other specified soft tissue disorders: Secondary | ICD-10-CM

## 2017-03-15 ENCOUNTER — Other Ambulatory Visit: Payer: Self-pay | Admitting: Family Medicine

## 2017-04-09 ENCOUNTER — Other Ambulatory Visit: Payer: Self-pay | Admitting: Family Medicine

## 2017-04-15 ENCOUNTER — Other Ambulatory Visit: Payer: Self-pay | Admitting: Family Medicine

## 2017-05-02 ENCOUNTER — Other Ambulatory Visit: Payer: Self-pay | Admitting: Family Medicine

## 2017-05-16 ENCOUNTER — Other Ambulatory Visit: Payer: Self-pay | Admitting: Family Medicine

## 2017-05-21 NOTE — Progress Notes (Signed)
Subjective:    Patient ID: [name], female    DOB: Jun 18, 1924, 84 y.o.   MRN: [id number]  HPI Pt here for follow up and management of chronic medical problems which include hypertension, hypothyroidism, GERD, hyperlipidemia and heart failure.  The patient comes to the visit today with her nieces daughter Aundra Millet.  She has no specific complaints today.  Vital signs are stable except that the blood pressure is slightly elevated on the systolic side.  She will get lab work today.  She is due to get a mammogram.  The patient has no complaints today.  She denies any chest pain or shortness of breath.  She denies any trouble with swallowing heartburn indigestion nausea vomiting diarrhea blood in the stool change in bowel habits or trouble passing her water.  She is smiling and laughing and cutting up as usual.  She does see the cardiologist regularly because of her heart failure and atrial fibrillation.  She is no longer on a blood thinner because the wrist were felt to be greater with a blood thinner with the possibility of her falling.  She seems to be in good spirits.  She sees a cardiologist once a year in the fall.   Review of Systems  HENT: Positive for postnasal drip.   Musculoskeletal: Positive for arthralgias (bilateral shoulder pain).  Allergic/Immunologic: Positive for environmental allergies.  Psychiatric/Behavioral: Negative.        Objective:   Physical Exam  Constitutional: She is oriented to person, place, and time. She appears well-developed and well-nourished. No distress.  The patient is pleasant and memory wise appears to be stable with minimal change from previous visit  HENT:  Head: Normocephalic and atraumatic.  Right Ear: External ear normal.  Left Ear: External ear normal.  Nose: Nose normal.  Mouth/Throat: Oropharynx is clear and moist. No oropharyngeal exudate.  Eyes: Conjunctivae and EOM are normal. Pupils are equal, round, and reactive to light. Right eye  exhibits no discharge. Left eye exhibits no discharge. No scleral icterus.  Neck: Normal range of motion. Neck supple. No thyromegaly present.  No bruits thyromegaly or anterior cervical adenopathy  Cardiovascular: Normal rate, regular rhythm, normal heart sounds and intact distal pulses.  No murmur heard. Heart has a regular rate and rhythm today at 72/min pedal pulses were present but decreased.  Pulmonary/Chest: Effort normal and breath sounds normal. No respiratory distress. She has no wheezes. She has no rales.  Clear anteriorly and posteriorly  Abdominal: Soft. Bowel sounds are normal. She exhibits no mass. There is no tenderness. There is no rebound and no guarding.  No abdominal tenderness masses organ enlargement or bruits  Genitourinary:  Genitourinary Comments: Both breasts were checked and axillary regions were checked and there was no sign of any lumps or masses or abnormality detected.  Musculoskeletal: Normal range of motion. She exhibits no edema or tenderness.  Lymphadenopathy:    She has no cervical adenopathy.  Neurological: She is alert and oriented to person, place, and time. She has normal reflexes. No cranial nerve deficit.  Skin: Skin is warm and dry. No rash noted.  Psychiatric: She has a normal mood and affect. Her behavior is normal. Judgment and thought content normal.  Patient was pleasant and smiling and answering questions appropriately  Nursing note and vitals reviewed.  BP (!) 145/59 (BP Location: Left Arm, Patient Position: Sitting, Cuff Size: Normal)   Pulse 65   Temp 97.7 F (36.5 C) (Oral)   Ht 5' (1.524 m)  Wt 144 lb 3.2 oz (65.4 kg)   BMI 28.16 kg/m        Assessment & Plan:  1. Essential hypertension -The systolic blood pressure reading was slightly elevated today but because of her age no change in treatment. - CBC with Differential/Platelet - Basic Metabolic Panel  2. Pure hypercholesterolemia -Continue with aggressive therapeutic  lifestyle changes - Lipid panel - Hepatic function panel  3. Paroxysmal atrial fibrillation (HCC) -The patient was in normal sinus rhythm today at 72/min  4. Hypothyroidism due to acquired atrophy of thyroid -Thyroid profile with TSH  5. Vitamin D deficiency -Continue vitamin D replacement and calcium pending results of lab work  6. Gastroesophageal reflux disease, esophagitis presence not specified -Continue with omeprazole and any anti-reflex measures.  7. Pulmonary nodule -Patient has some small and thought to be benign small apical nodules.  She has bronchiectasis.  The last CT scan was done less than a year ago we will continue to monitor this.  As the patient is doing well currently with her breathing.  8. Memory impairment -Continue with current treatment  9.  Bronchiectasis -The patient is doing well pulmonary wise and had a CT scan in April of this past year.  No orders of the defined types were placed in this encounter.  Patient Instructions  Continue current medications. Continue good therapeutic lifestyle changes which include good diet and exercise. Fall precautions discussed with patient. If an FOBT was given today- please return it to our front desk. If you are over 82 years old - you may need Prevnar 13 or the adult Pneumonia vaccine.  **Flu shots are available--- please call and schedule a FLU-CLINIC appointment**  After your visit with us today you will receive a survey in the mail or online from [employer] regarding your care with us. Please take a moment to fill this out. Your feedback is very important to us as you can help us better understand your patient needs as well as improve your experience and satisfaction. WE CARE ABOUT YOU!!!   Continue to be careful do not put yourself at risk for falling Use walker regularly Drink plenty of fluids and stay well-hydrated Keep the house as cool as possible Eat healthy Follow-up with cardiology as  planned  Nyra Capeson W. Moore MD

## 2017-05-23 ENCOUNTER — Other Ambulatory Visit: Payer: Self-pay | Admitting: Family Medicine

## 2017-05-23 ENCOUNTER — Ambulatory Visit (INDEPENDENT_AMBULATORY_CARE_PROVIDER_SITE_OTHER): Payer: Medicare Other | Admitting: Family Medicine

## 2017-05-23 ENCOUNTER — Encounter: Payer: Self-pay | Admitting: Family Medicine

## 2017-05-23 VITALS — BP 145/59 | HR 65 | Temp 97.7°F | Ht 60.0 in | Wt 144.2 lb

## 2017-05-23 DIAGNOSIS — E034 Atrophy of thyroid (acquired): Secondary | ICD-10-CM | POA: Diagnosis not present

## 2017-05-23 DIAGNOSIS — I10 Essential (primary) hypertension: Secondary | ICD-10-CM | POA: Diagnosis not present

## 2017-05-23 DIAGNOSIS — E7800 Pure hypercholesterolemia, unspecified: Secondary | ICD-10-CM

## 2017-05-23 DIAGNOSIS — K219 Gastro-esophageal reflux disease without esophagitis: Secondary | ICD-10-CM

## 2017-05-23 DIAGNOSIS — I480 Paroxysmal atrial fibrillation: Secondary | ICD-10-CM | POA: Diagnosis not present

## 2017-05-23 DIAGNOSIS — R911 Solitary pulmonary nodule: Secondary | ICD-10-CM | POA: Diagnosis not present

## 2017-05-23 DIAGNOSIS — J479 Bronchiectasis, uncomplicated: Secondary | ICD-10-CM

## 2017-05-23 DIAGNOSIS — E559 Vitamin D deficiency, unspecified: Secondary | ICD-10-CM

## 2017-05-23 DIAGNOSIS — R413 Other amnesia: Secondary | ICD-10-CM

## 2017-05-23 NOTE — Patient Instructions (Addendum)
Continue current medications. Continue good therapeutic lifestyle changes which include good diet and exercise. Fall precautions discussed with patient. If an FOBT was given today- please return it to our front desk. If you are over 82 years old - you may need Prevnar 13 or the adult Pneumonia vaccine.  **Flu shots are available--- please call and schedule a FLU-CLINIC appointment**  After your visit with us today you will receive a survey in the mail or online from [employer] regarding your care with us. Please take a moment to fill this out. Your feedback is very important to us as you can help us better understand your patient needs as well as improve your experience and satisfaction. WE CARE ABOUT YOU!!!   Continue to be careful do not put yourself at risk for falling Use walker regularly Drink plenty of fluids and stay well-hydrated Keep the house as cool as possible Eat healthy Follow-up with cardiology as planned

## 2017-05-23 NOTE — Addendum Note (Signed)
Addended by: Bearl MulberryUTHERFORD, Farouk Vivero K on: 05/23/2017 02:47 PM   Modules accepted: Orders

## 2017-05-24 LAB — LIPID PANEL
CHOLESTEROL TOTAL: 199 mg/dL (ref 100–199)
Chol/HDL Ratio: 4.2 ratio (ref 0.0–4.4)
HDL: 47 mg/dL (ref >39)
LDL CALC: 93 mg/dL (ref 0–99)
Triglycerides: 296 mg/dL — ABNORMAL HIGH (ref 0–149)
VLDL Cholesterol Cal: 59 mg/dL — ABNORMAL HIGH (ref 5–40)

## 2017-05-24 LAB — BASIC METABOLIC PANEL
BUN/Creatinine Ratio: 15 (ref 12–28)
BUN: 16 mg/dL (ref 10–36)
CO2: 25 mmol/L (ref 20–29)
CREATININE: 1.1 mg/dL — AB (ref 0.57–1.00)
Calcium: 9.2 mg/dL (ref 8.7–10.3)
Chloride: 104 mmol/L (ref 96–106)
GFR calc Af Amer: 51 mL/min/{1.73_m2} — ABNORMAL LOW (ref >59)
GFR calc non Af Amer: 44 mL/min/{1.73_m2} — ABNORMAL LOW (ref >59)
GLUCOSE: 88 mg/dL (ref 65–99)
POTASSIUM: 4.8 mmol/L (ref 3.5–5.2)
SODIUM: 144 mmol/L (ref 134–144)

## 2017-05-24 LAB — CBC WITH DIFFERENTIAL/PLATELET
BASOS ABS: 0.1 10*3/uL (ref 0.0–0.2)
Basos: 1 %
EOS (ABSOLUTE): 0.1 10*3/uL (ref 0.0–0.4)
Eos: 1 %
Hematocrit: 32.9 % — ABNORMAL LOW (ref 34.0–46.6)
Hemoglobin: 10.6 g/dL — ABNORMAL LOW (ref 11.1–15.9)
Immature Grans (Abs): 0 10*3/uL (ref 0.0–0.1)
Immature Granulocytes: 0 %
LYMPHS ABS: 1.6 10*3/uL (ref 0.7–3.1)
Lymphs: 23 %
MCH: 28.9 pg (ref 26.6–33.0)
MCHC: 32.2 g/dL (ref 31.5–35.7)
MCV: 90 fL (ref 79–97)
MONOS ABS: 0.7 10*3/uL (ref 0.1–0.9)
Monocytes: 10 %
NEUTROS ABS: 4.5 10*3/uL (ref 1.4–7.0)
Neutrophils: 65 %
PLATELETS: 270 10*3/uL (ref 150–379)
RBC: 3.67 x10E6/uL — AB (ref 3.77–5.28)
RDW: 14.5 % (ref 12.3–15.4)
WBC: 7 10*3/uL (ref 3.4–10.8)

## 2017-05-24 LAB — THYROID PANEL WITH TSH
FREE THYROXINE INDEX: 2.5 (ref 1.2–4.9)
T3 UPTAKE RATIO: 30 % (ref 24–39)
T4, Total: 8.4 ug/dL (ref 4.5–12.0)
TSH: 2.06 u[IU]/mL (ref 0.450–4.500)

## 2017-05-24 LAB — HEPATIC FUNCTION PANEL
ALT: 12 IU/L (ref 0–32)
AST: 20 IU/L (ref 0–40)
Albumin: 4.3 g/dL (ref 3.2–4.6)
Alkaline Phosphatase: 73 IU/L (ref 39–117)
BILIRUBIN, DIRECT: 0.05 mg/dL (ref 0.00–0.40)
Total Protein: 6.5 g/dL (ref 6.0–8.5)

## 2017-05-27 ENCOUNTER — Other Ambulatory Visit: Payer: Self-pay | Admitting: Family Medicine

## 2017-05-27 ENCOUNTER — Other Ambulatory Visit: Payer: Self-pay | Admitting: Nurse Practitioner

## 2017-06-24 ENCOUNTER — Other Ambulatory Visit: Payer: Self-pay | Admitting: Family Medicine

## 2017-06-30 ENCOUNTER — Other Ambulatory Visit: Payer: Self-pay | Admitting: Family Medicine

## 2017-07-05 IMAGING — DX DG SHOULDER 2+V*L*
3 series · 3 of 3 positions shown · non-contrast
Comparison: Chest x-ray of 07/09/2015 and 05/26/2015

CLINICAL DATA: Left shoulder pain, fell in [REDACTED]

EXAM:
LEFT SHOULDER - 2+ VIEW

[shoulder ap]
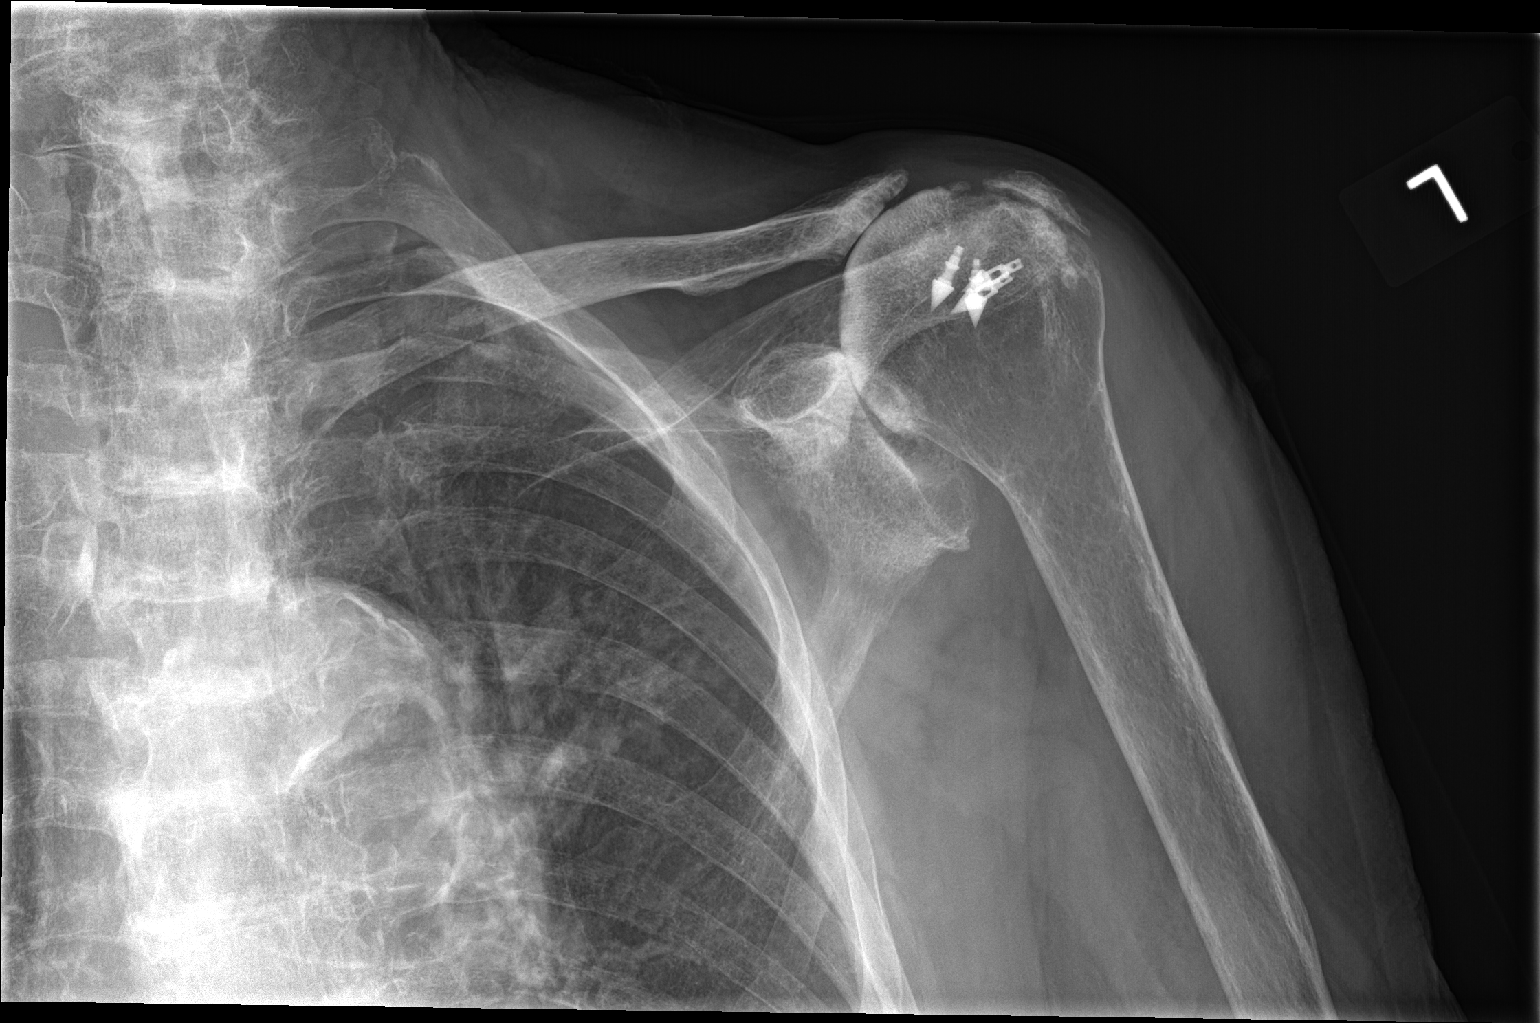

[shoulder obl]
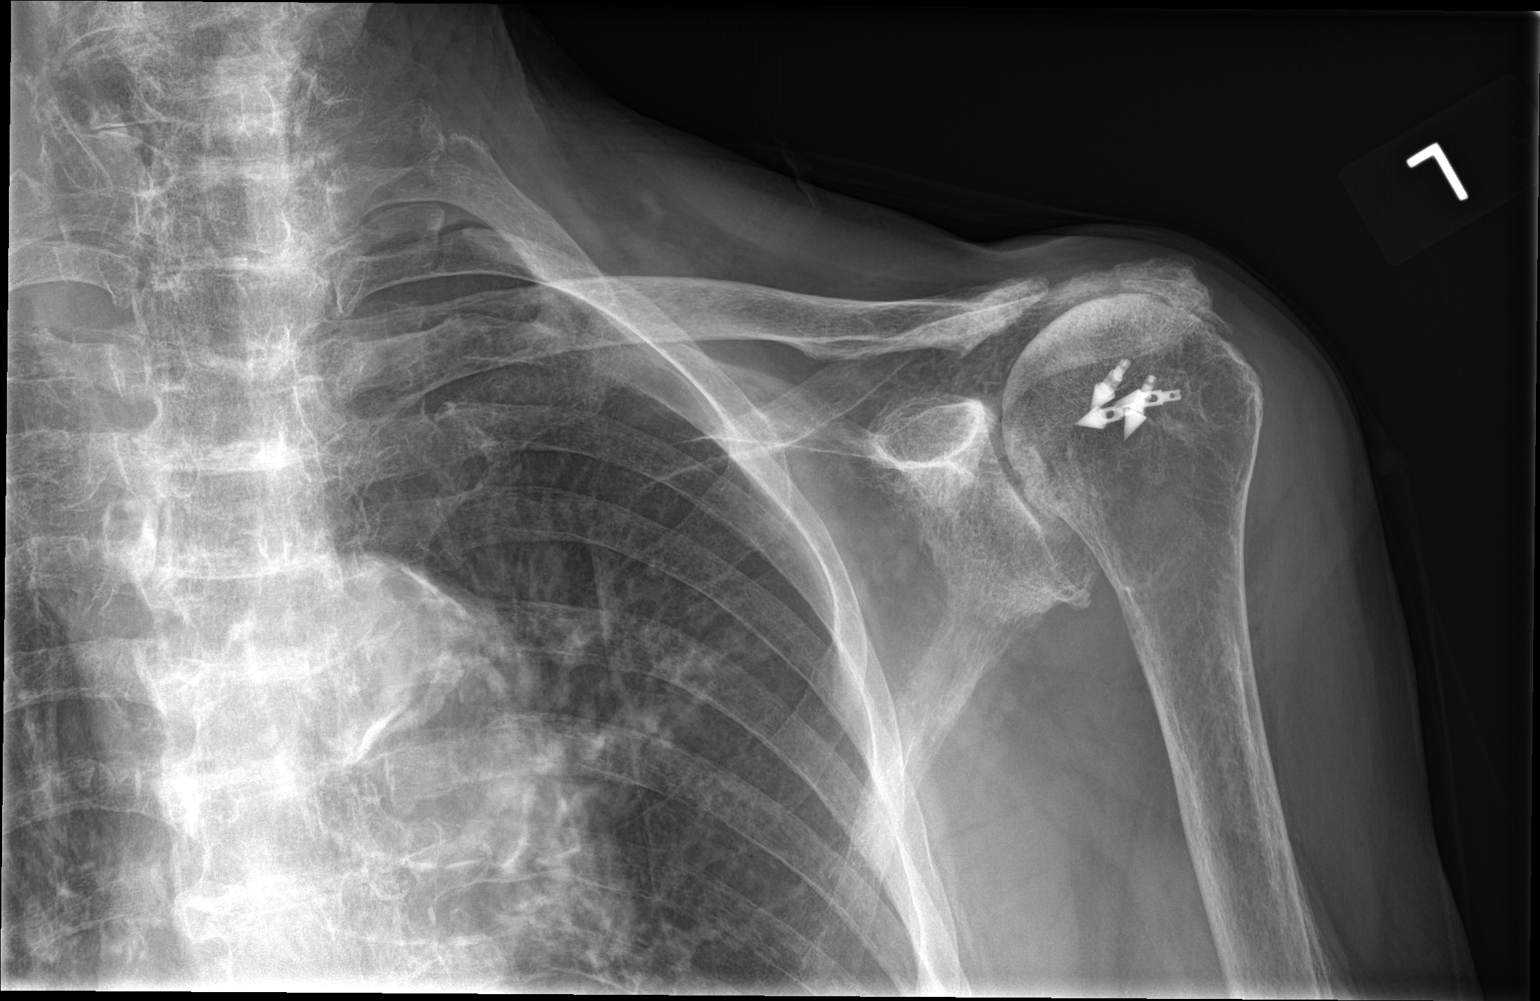

[shoulder axial]
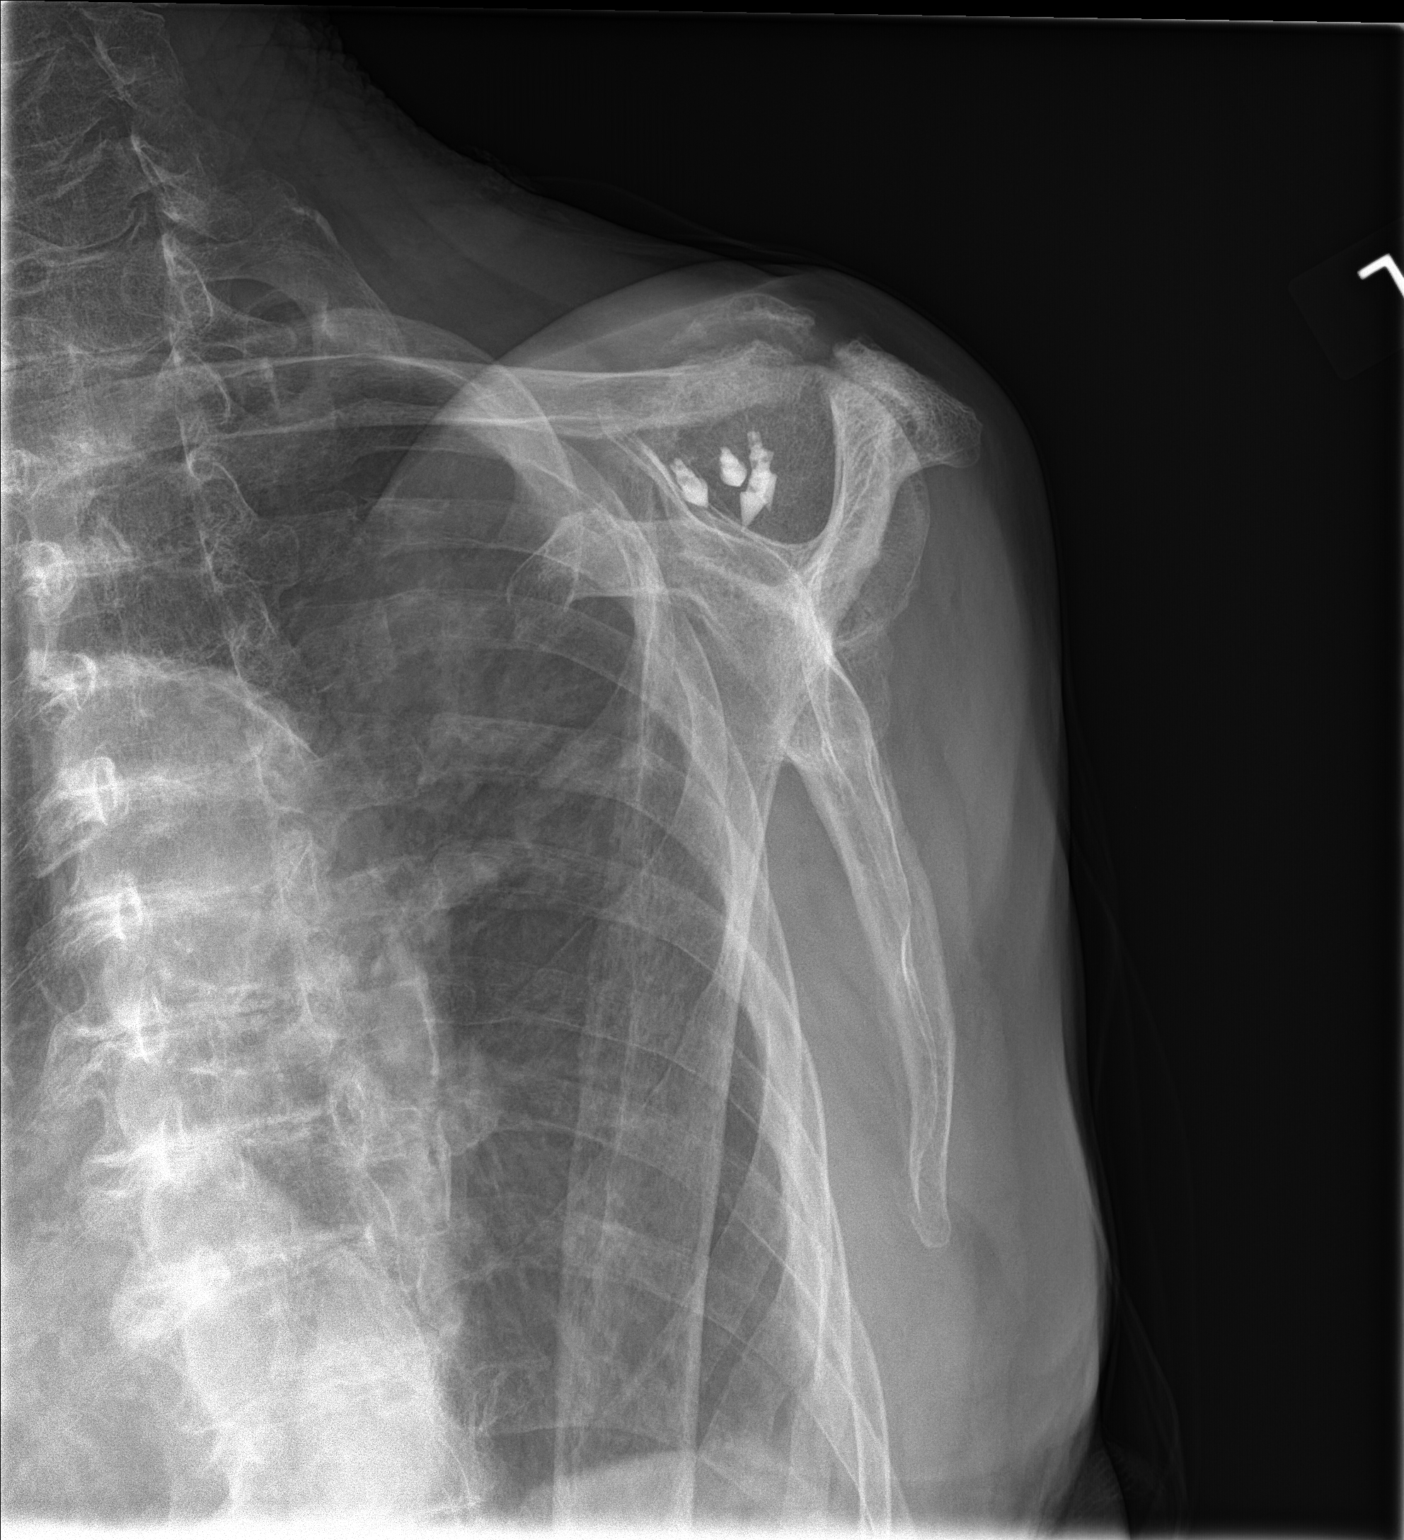

[3 of 3 positions shown; findings below may reference images not displayed]

FINDINGS: There is significant degenerative joint disease involving the left
shoulder with complete loss of left shoulder joint space, sclerosis,
and spurring present. Anchors are noted within the left humeral head
presumably due to prior rotator cuff repair. There is complete
absence of the subacromial space consistent with chronic rotator
cuff disease. No acute fracture is seen.
IMPRESSION: 1. Significant degenerative joint disease involving the left
shoulder.
2. Suspect chronic rotator cuff disease.
3. No fracture .

## 2017-07-18 ENCOUNTER — Other Ambulatory Visit: Payer: Self-pay | Admitting: Family Medicine

## 2017-07-26 ENCOUNTER — Other Ambulatory Visit: Payer: Self-pay | Admitting: Family Medicine

## 2017-08-18 ENCOUNTER — Other Ambulatory Visit: Payer: Self-pay | Admitting: Family Medicine

## 2017-08-23 ENCOUNTER — Other Ambulatory Visit: Payer: Self-pay | Admitting: Family Medicine

## 2017-08-23 DIAGNOSIS — M7989 Other specified soft tissue disorders: Secondary | ICD-10-CM

## 2017-08-30 ENCOUNTER — Encounter: Payer: Self-pay | Admitting: *Deleted

## 2017-09-12 ENCOUNTER — Other Ambulatory Visit: Payer: Self-pay | Admitting: Family Medicine

## 2017-09-27 ENCOUNTER — Other Ambulatory Visit: Payer: Self-pay | Admitting: Family Medicine

## 2017-09-27 NOTE — Telephone Encounter (Signed)
Ov 12/02/17 

## 2017-10-14 ENCOUNTER — Other Ambulatory Visit: Payer: Self-pay | Admitting: Family Medicine

## 2017-10-17 ENCOUNTER — Other Ambulatory Visit: Payer: Self-pay | Admitting: *Deleted

## 2017-10-17 DIAGNOSIS — R911 Solitary pulmonary nodule: Secondary | ICD-10-CM

## 2017-10-17 MED ORDER — BUDESONIDE-FORMOTEROL FUMARATE 80-4.5 MCG/ACT IN AERO: 2.0000 | INHALATION_SPRAY | Freq: Two times a day (BID) | RESPIRATORY_TRACT | 2 refills | Status: DC

## 2017-10-20 ENCOUNTER — Other Ambulatory Visit: Payer: Self-pay | Admitting: Family Medicine

## 2017-10-20 DIAGNOSIS — M7989 Other specified soft tissue disorders: Secondary | ICD-10-CM

## 2017-11-07 ENCOUNTER — Other Ambulatory Visit: Payer: Self-pay | Admitting: Family Medicine

## 2017-11-21 ENCOUNTER — Ambulatory Visit: Payer: Medicare Other | Admitting: Family Medicine

## 2017-11-21 ENCOUNTER — Other Ambulatory Visit: Payer: Self-pay | Admitting: Family Medicine

## 2017-12-02 ENCOUNTER — Ambulatory Visit: Payer: Medicare Other | Admitting: Family Medicine

## 2017-12-13 ENCOUNTER — Other Ambulatory Visit: Payer: Self-pay | Admitting: Family Medicine

## 2017-12-19 ENCOUNTER — Ambulatory Visit (INDEPENDENT_AMBULATORY_CARE_PROVIDER_SITE_OTHER): Payer: Medicare Other | Admitting: Family Medicine

## 2017-12-19 ENCOUNTER — Other Ambulatory Visit: Payer: Self-pay | Admitting: Family Medicine

## 2017-12-19 ENCOUNTER — Encounter: Payer: Self-pay | Admitting: Family Medicine

## 2017-12-19 VITALS — BP 134/65 | HR 56 | Temp 98.4°F | Ht 60.0 in | Wt 147.0 lb

## 2017-12-19 DIAGNOSIS — H6121 Impacted cerumen, right ear: Secondary | ICD-10-CM | POA: Diagnosis not present

## 2017-12-19 DIAGNOSIS — I700 Atherosclerosis of aorta: Secondary | ICD-10-CM

## 2017-12-19 DIAGNOSIS — R413 Other amnesia: Secondary | ICD-10-CM

## 2017-12-19 DIAGNOSIS — J479 Bronchiectasis, uncomplicated: Secondary | ICD-10-CM | POA: Insufficient documentation

## 2017-12-19 DIAGNOSIS — E034 Atrophy of thyroid (acquired): Secondary | ICD-10-CM | POA: Diagnosis not present

## 2017-12-19 DIAGNOSIS — I10 Essential (primary) hypertension: Secondary | ICD-10-CM | POA: Diagnosis not present

## 2017-12-19 DIAGNOSIS — R911 Solitary pulmonary nodule: Secondary | ICD-10-CM

## 2017-12-19 DIAGNOSIS — E7800 Pure hypercholesterolemia, unspecified: Secondary | ICD-10-CM | POA: Diagnosis not present

## 2017-12-19 DIAGNOSIS — I480 Paroxysmal atrial fibrillation: Secondary | ICD-10-CM

## 2017-12-19 DIAGNOSIS — K219 Gastro-esophageal reflux disease without esophagitis: Secondary | ICD-10-CM

## 2017-12-19 DIAGNOSIS — E559 Vitamin D deficiency, unspecified: Secondary | ICD-10-CM

## 2017-12-19 MED ORDER — VENLAFAXINE HCL ER 37.5 MG PO CP24: 37.5000 mg | ORAL_CAPSULE | Freq: Every day | ORAL | 3 refills | Status: DC

## 2017-12-19 NOTE — Progress Notes (Signed)
Subjective:    Patient ID: [name], female    DOB: 03-01-1925, 84 y.o.   MRN: [id number]  HPI Pt here for follow up and management of chronic medical problems which includes hypothyroid, hypertension and hyperlipidemia. He is taking medication regularly.  The patient is doing well overall but just does not want to get energized to do anything.  Her great niece, Jinny Blossom comes with her to the visit today.  She will get lab work and will be given an FOBT to return.  She has a cardiac visit scheduled in October which is a yearly visit.  The last CT scan of the chest showed stable and benign small apical right lung nodules related to scarring.  Bronchiectasis has progressed since the previous CT in 2016.  This CT scan was done in April 2018.  The bronchiectasis was considered moderate.  There is no acute pulmonary process identified and she does have calcific aortic and coronary artery atherosclerosis.  And this was in April 2018.    Patient Active Problem List   Diagnosis Date Noted  . Hypothyroidism 03/31/2015  . CHF (congestive heart failure) ([place]) 11/15/2014  . Absolute anemia 10/18/2014  . Bruising, spontaneous 09/16/2014  . Long term current use of anticoagulant 09/16/2014  . Atrial fibrillation ([place]) 04/15/2014  . Edema 04/15/2014  . Nodule of right lung 05/27/2013  . Memory impairment 03/12/2013  . Prophylactic immunotherapy 10/24/2012  . Dyspnea 07/06/2012  . Extrinsic asthma 08/31/2010  . Atrophic vaginitis   . Malaise and fatigue   . Thoracic scoliosis   . Insomnia, idiopathic   . Gastritis   . Gastroesophageal reflux disease with hiatal hernia   . Colon polyps   . Diverticulosis   . Osteoarthrosis and allied disorders   . Hyperlipidemia 02/11/2009  . Essential hypertension 02/10/2009   Outpatient Encounter Medications as of 12/19/2017  Medication Sig  . albuterol (PROAIR HFA) 108 (90 Base) MCG/ACT inhaler Inhale 2 puffs into the lungs every 6 (six) hours as  needed.  Marland Kitchen albuterol (PROVENTIL) (2.5 MG/3ML) 0.083% nebulizer solution Take 3 mLs (2.5 mg total) by nebulization every 6 (six) hours as needed for wheezing or shortness of breath.  Marland Kitchen amiodarone (PACERONE) 200 MG tablet TAKE (1/2) TABLET DAILY.  Marland Kitchen Biotin 5 MG TABS Take by mouth.  . budesonide-formoterol (SYMBICORT) 80-4.5 MCG/ACT inhaler Inhale 2 puffs into the lungs 2 (two) times daily.  . busPIRone (BUSPAR) 5 MG tablet TAKE ONE TABLET AT BEDTIME  . calcium-vitamin D (OSCAL WITH D) 500-200 MG-UNIT tablet Take 1 tablet by mouth.  . donepezil (ARICEPT) 10 MG tablet TAKE 1 TABLET DAILY  . furosemide (LASIX) 20 MG tablet TAKE 2 TABLET A DAY AS DIRECTED.  Marland Kitchen levothyroxine (SYNTHROID, LEVOTHROID) 75 MCG tablet TAKE 1 TABLET DAILY  . losartan (COZAAR) 100 MG tablet TAKE 1 TABLET DAILY  . memantine (NAMENDA) 10 MG tablet TAKE  (1)  TABLET TWICE A DAY.  . metoprolol succinate (TOPROL-XL) 25 MG 24 hr tablet TAKE 1 TABLET DAILY  . Multiple Vitamin (MULTIVITAMIN WITH MINERALS) TABS tablet Take 1 tablet by mouth daily.  Marland Kitchen omeprazole (PRILOSEC) 20 MG capsule TAKE (1) CAPSULE TWICE DAILY.  Marland Kitchen potassium chloride SA (K-DUR,KLOR-CON) 20 MEQ tablet Take 1 tablet (20 mEq total) by mouth daily.   No facility-administered encounter medications on file as of 12/19/2017.      Review of Systems  Constitutional: Negative.   HENT: Negative.   Eyes: Negative.   Respiratory: Negative.   Cardiovascular: Negative.  Gastrointestinal: Negative.   Endocrine: Negative.   Genitourinary: Negative.   Musculoskeletal: Negative.   Skin: Negative.   Allergic/Immunologic: Negative.   Neurological: Negative.   Hematological: Negative.   Psychiatric/Behavioral: Negative.        "No WANT" to do anything       Objective:   Physical Exam  Constitutional: She is oriented to person, place, and time. She appears well-developed and well-nourished.  The patient is calm and pleasant and hears well and overall appears to be  stable from her previous visit.  She comes with her great niece to the visit today.  HENT:  Head: Normocephalic and atraumatic.  Left Ear: External ear normal.  Nose: Nose normal.  Mouth/Throat: Oropharynx is clear and moist.  Ear cerumen right ear canal  Eyes: Pupils are equal, round, and reactive to light. Conjunctivae and EOM are normal. Right eye exhibits no discharge. Left eye exhibits no discharge. No scleral icterus.  Neck: Normal range of motion. Neck supple. No thyromegaly present.  No bruits thyromegaly or anterior cervical adenopathy  Cardiovascular: Normal rate, regular rhythm, normal heart sounds and intact distal pulses.  No murmur heard. Heart today was regular at 60/min  Pulmonary/Chest: Effort normal and breath sounds normal. No respiratory distress. She has no wheezes. She has no rales.  Clear anteriorly and posteriorly.  No increased congestion or tightness noted with coughing.  Abdominal: Soft. Bowel sounds are normal. She exhibits no mass. There is no tenderness.  Or spleen enlargement.  No epigastric tenderness.  No masses no bruits and no inguinal adenopathy detected.  Musculoskeletal: Normal range of motion. She exhibits no edema.  She uses a walker for ambulation.  The niece says that she uses this regularly around the house.  Lymphadenopathy:    She has no cervical adenopathy.  Neurological: She is alert and oriented to person, place, and time. She has normal reflexes. No cranial nerve deficit.  Reflexes are 2+ and equal bilaterally  Skin: Skin is warm and dry. No rash noted.  Psychiatric: She has a normal mood and affect. Her behavior is normal. Judgment and thought content normal.  Patient was asked why she did not want to get out of the house and do a lot she says that she just enjoys being in her house and she is 8 years old.  The niece says that they have tried to encourage her to come out for family outings and she just prefers to stay in.  We discussed the  possibility of trying a low-dose antidepressant like Effexor or Lexapro and the family will discuss this among themselves and get back in touch with [place].  Nursing note and vitals reviewed.  BP 134/65 (BP Location: Left Arm)   Pulse (!) 56   Temp 98.4 F (36.9 C) (Oral)   Ht 5' (1.524 m)   Wt 147 lb (66.7 kg)   BMI 28.71 kg/m         Assessment & Plan:  1. Hypothyroidism due to acquired atrophy of thyroid - CBC with Differential/Platelet - Thyroid Panel With TSH  2. Pure hypercholesterolemia -Continue with as aggressive therapeutic lifestyle changes as possible - CBC with Differential/Platelet - Lipid panel - Hepatic function panel  3. Essential hypertension -Blood pressure is good today she will continue with current treatment - BMP8+EGFR - CBC with Differential/Platelet - Hepatic function panel  4. Paroxysmal atrial fibrillation ([place]) -Follow-up with cardiology in October the rhythm appeared regular today. - CBC with Differential/Platelet  5. Gastroesophageal reflux disease,  esophagitis presence not specified -Patient is on omeprazole and has no complaints with her GI track including heartburn - CBC with Differential/Platelet - Hepatic function panel  6. Vitamin D deficiency -Continue with vitamin D replacement - CBC with Differential/Platelet - VITAMIN D 25 Hydroxy (Vit-D Deficiency, Fractures)  7. Pulmonary nodule -Patient also has bronchiectasis and we will have the radiologist review her films from the spring 2018.  The great-niece says she is not having any complaints or symptoms with coughing or breathing.  She is 84 years old.  Unless there is any special concern from the radiologist we will most likely not repeat the CT scan but we will discuss this with the family wants to have the radiologist review the films.  Breath sounds today were good and even with coughing there is no increased congestion. - CBC with Differential/Platelet  8. Memory  impairment -This appears to be stable with Aricept and Namenda and she will continue with this. - CBC with Differential/Platelet  9. Bronchiectasis without complication ([place]) -No complaints from family or patient regarding her breathing or coughing. -The patient was encouraged to use her Symbicort regularly.  And keep drinking plenty of fluids. - CBC with Differential/Platelet  10. Aortic atherosclerosis ([place]) -10 you with as aggressive therapeutic lifestyle changes as possible including good eating habits. - CBC with Differential/Platelet  11. Right ear impacted cerumen -Lavage to remove cerumen and continue with Debrox at home to prevent ear cerumen accumulation  Patient Instructions                       Medicare Annual Wellness Visit  [facility] and the medical providers at [facility] strive to bring you the best medical care.  In doing so we not only want to address your current medical conditions and concerns but also to detect new conditions early and prevent illness, disease and health-related problems.    Medicare offers a yearly Wellness Visit which allows our clinical staff to assess your need for preventative services including immunizations, lifestyle education, counseling to decrease risk of preventable diseases and screening for fall risk and other medical concerns.    This visit is provided free of charge (no copay) for all Medicare recipients. The clinical pharmacists at [facility] have begun to conduct these Wellness Visits which will also include a thorough review of all your medications.    As you primary medical provider recommend that you make an appointment for your Annual Wellness Visit if you have not done so already this year.  You may set up this appointment before you leave today or you may call back ([phone]) and schedule an appointment.  Please make sure when you call that you mention that you are scheduling your  Annual Wellness Visit with the clinical pharmacist so that the appointment may be made for the proper length of time.     Continue current medications. Continue good therapeutic lifestyle changes which include good diet and exercise. Fall precautions discussed with patient. If an FOBT was given today- please return it to our front desk. If you are over 34 years old - you may need Prevnar 57 or the adult Pneumonia vaccine.  **Flu shots are available--- please call and schedule a FLU-CLINIC appointment**  After your visit with [place] today you will receive a survey in the mail or online from [employer] regarding your care with [place]. Please take a moment to fill this out. Your feedback is very  important to [place] as you can help [place] better understand your patient needs as well as improve your experience and satisfaction. WE CARE ABOUT YOU!!!     Arrie Senate MD

## 2017-12-19 NOTE — Addendum Note (Signed)
Addended by: Magdalene RiverBULLINS, Vineeth Fell H on: 12/19/2017 12:04 PM   Modules accepted: Orders

## 2017-12-19 NOTE — Patient Instructions (Addendum)
Medicare Annual Wellness Visit  [facility] and the medical providers at [facility] strive to bring you the best medical care.  In doing [facility] we not only want to address your current medical conditions and concerns but also to detect new conditions early and prevent illness, disease and health-related problems.    Medicare offers a yearly Wellness Visit which allows our clinical staff to assess your need for preventative services including immunizations, lifestyle education, counseling to decrease risk of preventable diseases and screening for fall risk and other medical concerns.    This visit is provided free of charge (no copay) for all Medicare recipients. [facility] have begun to conduct these Wellness Visits which will also include a thorough review of all your medications.    As you primary medical provider recommend that you make an appointment for your Annual Wellness Visit if you have not done so already this year.  You may set up this appointment before you leave today or you may call back [phone] and schedule an appointment.  Please make sure when you call that you mention that you are scheduling your Annual Wellness Visit with the clinical pharmacist so that the appointment may be made for the proper length of time.     Continue current medications. Continue good therapeutic lifestyle changes which include good diet and exercise. Fall precautions discussed with patient. If an FOBT was given today- please return it to our front desk. If you are over 82 years old - you may need Prevnar 13 or the adult Pneumonia vaccine.  **Flu shots are available--- please call and schedule a FLU-CLINIC appointment**  After your visit with us today you will receive a survey in the mail or online from [employer] regarding your care with us. Please take a moment to fill this out. Your feedback is very  important to us as you can help us better understand your patient needs as well as improve your experience and satisfaction. WE CARE ABOUT YOU!!!   Follow-up with cardiology as planned Check back with us regarding radiology recommendations regarding CT scan from the spring 2018 Continue to drink plenty of fluids Discuss with family members the thoughts of trying a low dose antidepressant and get back in touch with us.

## 2017-12-20 LAB — BMP8+EGFR
BUN/Creatinine Ratio: 15 (ref 12–28)
BUN: 18 mg/dL (ref 10–36)
CO2: 24 mmol/L (ref 20–29)
CREATININE: 1.2 mg/dL — AB (ref 0.57–1.00)
Calcium: 9.2 mg/dL (ref 8.7–10.3)
Chloride: 101 mmol/L (ref 96–106)
GFR calc Af Amer: 46 mL/min/{1.73_m2} — ABNORMAL LOW (ref >59)
GFR, EST NON AFRICAN AMERICAN: 40 mL/min/{1.73_m2} — AB (ref >59)
GLUCOSE: 93 mg/dL (ref 65–99)
POTASSIUM: 4.2 mmol/L (ref 3.5–5.2)
SODIUM: 142 mmol/L (ref 134–144)

## 2017-12-20 LAB — CBC WITH DIFFERENTIAL/PLATELET
BASOS: 1 %
Basophils Absolute: 0 10*3/uL (ref 0.0–0.2)
EOS (ABSOLUTE): 0.1 10*3/uL (ref 0.0–0.4)
Eos: 1 %
Hematocrit: 33.9 % — ABNORMAL LOW (ref 34.0–46.6)
Hemoglobin: 10.8 g/dL — ABNORMAL LOW (ref 11.1–15.9)
IMMATURE GRANS (ABS): 0 10*3/uL (ref 0.0–0.1)
IMMATURE GRANULOCYTES: 0 %
LYMPHS: 22 %
Lymphocytes Absolute: 1.5 10*3/uL (ref 0.7–3.1)
MCH: 28.3 pg (ref 26.6–33.0)
MCHC: 31.9 g/dL (ref 31.5–35.7)
MCV: 89 fL (ref 79–97)
MONOS ABS: 0.8 10*3/uL (ref 0.1–0.9)
Monocytes: 12 %
NEUTROS PCT: 64 %
Neutrophils Absolute: 4.4 10*3/uL (ref 1.4–7.0)
PLATELETS: 231 10*3/uL (ref 150–450)
RBC: 3.82 x10E6/uL (ref 3.77–5.28)
RDW: 14.9 % (ref 12.3–15.4)
WBC: 6.7 10*3/uL (ref 3.4–10.8)

## 2017-12-20 LAB — LIPID PANEL
CHOLESTEROL TOTAL: 229 mg/dL — AB (ref 100–199)
Chol/HDL Ratio: 4.2 ratio (ref 0.0–4.4)
HDL: 55 mg/dL (ref >39)
LDL Calculated: 125 mg/dL — ABNORMAL HIGH (ref 0–99)
TRIGLYCERIDES: 245 mg/dL — AB (ref 0–149)
VLDL CHOLESTEROL CAL: 49 mg/dL — AB (ref 5–40)

## 2017-12-20 LAB — HEPATIC FUNCTION PANEL
ALK PHOS: 63 IU/L (ref 39–117)
ALT: 15 IU/L (ref 0–32)
AST: 21 IU/L (ref 0–40)
Albumin: 4.3 g/dL (ref 3.2–4.6)
BILIRUBIN TOTAL: 0.2 mg/dL (ref 0.0–1.2)
BILIRUBIN, DIRECT: 0.08 mg/dL (ref 0.00–0.40)
Total Protein: 6.5 g/dL (ref 6.0–8.5)

## 2017-12-20 LAB — VITAMIN D 25 HYDROXY (VIT D DEFICIENCY, FRACTURES): Vit D, 25-Hydroxy: 33.8 ng/mL (ref 30.0–100.0)

## 2017-12-20 LAB — THYROID PANEL WITH TSH
FREE THYROXINE INDEX: 2.9 (ref 1.2–4.9)
T3 Uptake Ratio: 31 % (ref 24–39)
T4, Total: 9.4 ug/dL (ref 4.5–12.0)
TSH: 4.48 u[IU]/mL (ref 0.450–4.500)

## 2018-01-02 ENCOUNTER — Other Ambulatory Visit: Payer: Self-pay | Admitting: Family Medicine

## 2018-01-11 ENCOUNTER — Other Ambulatory Visit: Payer: Self-pay | Admitting: Family Medicine

## 2018-01-13 ENCOUNTER — Other Ambulatory Visit: Payer: Self-pay | Admitting: Family Medicine

## 2018-02-07 ENCOUNTER — Ambulatory Visit (INDEPENDENT_AMBULATORY_CARE_PROVIDER_SITE_OTHER): Payer: Medicare Other

## 2018-02-07 DIAGNOSIS — Z23 Encounter for immunization: Secondary | ICD-10-CM | POA: Diagnosis not present

## 2018-03-13 ENCOUNTER — Other Ambulatory Visit: Payer: Self-pay | Admitting: Family Medicine

## 2018-03-24 ENCOUNTER — Other Ambulatory Visit: Payer: Self-pay | Admitting: Family Medicine

## 2018-04-03 ENCOUNTER — Other Ambulatory Visit: Payer: Self-pay | Admitting: Family Medicine

## 2018-04-04 ENCOUNTER — Other Ambulatory Visit: Payer: Self-pay

## 2018-04-14 ENCOUNTER — Other Ambulatory Visit: Payer: Self-pay | Admitting: Family Medicine

## 2018-05-15 ENCOUNTER — Other Ambulatory Visit: Payer: Self-pay | Admitting: *Deleted

## 2018-05-15 MED ORDER — OMEPRAZOLE 20 MG PO CPDR: 20.0000 mg | DELAYED_RELEASE_CAPSULE | Freq: Every day | ORAL | 3 refills | Status: DC

## 2018-05-26 ENCOUNTER — Other Ambulatory Visit: Payer: Self-pay | Admitting: Family Medicine

## 2018-05-26 NOTE — Telephone Encounter (Signed)
Last seen 12/19/17  DWM 

## 2018-06-08 ENCOUNTER — Other Ambulatory Visit: Payer: Self-pay | Admitting: Family Medicine

## 2018-06-17 ENCOUNTER — Other Ambulatory Visit: Payer: Self-pay | Admitting: Family Medicine

## 2018-06-19 ENCOUNTER — Ambulatory Visit (INDEPENDENT_AMBULATORY_CARE_PROVIDER_SITE_OTHER): Payer: Medicare Other | Admitting: Family Medicine

## 2018-06-19 ENCOUNTER — Encounter: Payer: Self-pay | Admitting: Family Medicine

## 2018-06-19 VITALS — BP 120/59 | HR 64 | Temp 98.9°F | Ht 60.0 in | Wt 151.0 lb

## 2018-06-19 DIAGNOSIS — E034 Atrophy of thyroid (acquired): Secondary | ICD-10-CM | POA: Diagnosis not present

## 2018-06-19 DIAGNOSIS — M7989 Other specified soft tissue disorders: Secondary | ICD-10-CM

## 2018-06-19 DIAGNOSIS — I700 Atherosclerosis of aorta: Secondary | ICD-10-CM

## 2018-06-19 DIAGNOSIS — I480 Paroxysmal atrial fibrillation: Secondary | ICD-10-CM

## 2018-06-19 DIAGNOSIS — I10 Essential (primary) hypertension: Secondary | ICD-10-CM

## 2018-06-19 DIAGNOSIS — R413 Other amnesia: Secondary | ICD-10-CM

## 2018-06-19 DIAGNOSIS — R911 Solitary pulmonary nodule: Secondary | ICD-10-CM

## 2018-06-19 DIAGNOSIS — K219 Gastro-esophageal reflux disease without esophagitis: Secondary | ICD-10-CM

## 2018-06-19 DIAGNOSIS — R062 Wheezing: Secondary | ICD-10-CM

## 2018-06-19 DIAGNOSIS — J4521 Mild intermittent asthma with (acute) exacerbation: Secondary | ICD-10-CM

## 2018-06-19 DIAGNOSIS — E559 Vitamin D deficiency, unspecified: Secondary | ICD-10-CM

## 2018-06-19 DIAGNOSIS — E7800 Pure hypercholesterolemia, unspecified: Secondary | ICD-10-CM

## 2018-06-19 MED ORDER — ALBUTEROL SULFATE HFA 108 (90 BASE) MCG/ACT IN AERS: 2.0000 | INHALATION_SPRAY | Freq: Four times a day (QID) | RESPIRATORY_TRACT | 11 refills | Status: AC | PRN

## 2018-06-19 MED ORDER — FUROSEMIDE 20 MG PO TABS: ORAL_TABLET | ORAL | 3 refills | Status: AC

## 2018-06-19 MED ORDER — LEVOTHYROXINE SODIUM 75 MCG PO TABS: 75.0000 ug | ORAL_TABLET | Freq: Every day | ORAL | 3 refills | Status: DC

## 2018-06-19 MED ORDER — METOPROLOL SUCCINATE ER 25 MG PO TB24: 25.0000 mg | ORAL_TABLET | Freq: Every day | ORAL | 3 refills | Status: AC

## 2018-06-19 MED ORDER — SPACER/AERO-HOLDING CHAMBERS DEVI: 1 refills | Status: AC

## 2018-06-19 MED ORDER — OMEPRAZOLE 20 MG PO CPDR: 20.0000 mg | DELAYED_RELEASE_CAPSULE | Freq: Every day | ORAL | 3 refills | Status: DC

## 2018-06-19 MED ORDER — ALBUTEROL SULFATE (2.5 MG/3ML) 0.083% IN NEBU: 2.5000 mg | INHALATION_SOLUTION | Freq: Four times a day (QID) | RESPIRATORY_TRACT | 11 refills | Status: DC | PRN

## 2018-06-19 MED ORDER — MEMANTINE HCL 10 MG PO TABS: ORAL_TABLET | ORAL | 3 refills | Status: AC

## 2018-06-19 MED ORDER — BUDESONIDE-FORMOTEROL FUMARATE 80-4.5 MCG/ACT IN AERO: 2.0000 | INHALATION_SPRAY | Freq: Two times a day (BID) | RESPIRATORY_TRACT | 11 refills | Status: DC

## 2018-06-19 MED ORDER — LOSARTAN POTASSIUM 100 MG PO TABS: 100.0000 mg | ORAL_TABLET | Freq: Every day | ORAL | 3 refills | Status: DC

## 2018-06-19 MED ORDER — POTASSIUM CHLORIDE CRYS ER 20 MEQ PO TBCR: EXTENDED_RELEASE_TABLET | ORAL | 3 refills | Status: AC

## 2018-06-19 MED ORDER — VENLAFAXINE HCL ER 37.5 MG PO CP24: 37.5000 mg | ORAL_CAPSULE | Freq: Every day | ORAL | 3 refills | Status: DC

## 2018-06-19 MED ORDER — BUSPIRONE HCL 5 MG PO TABS: 5.0000 mg | ORAL_TABLET | Freq: Every day | ORAL | 3 refills | Status: AC

## 2018-06-19 MED ORDER — DONEPEZIL HCL 10 MG PO TABS: 10.0000 mg | ORAL_TABLET | Freq: Every day | ORAL | 3 refills | Status: AC

## 2018-06-19 MED ORDER — AMIODARONE HCL 200 MG PO TABS: ORAL_TABLET | ORAL | 3 refills | Status: DC

## 2018-06-19 NOTE — Progress Notes (Signed)
Subjective:    Patient ID: [name], female    DOB: 1924-07-07, 85 y.o.   MRN: [id number]  HPI Pt here for follow up and management of chronic medical problems which includes a fib, hypothyroid and hyperlipidemia. She is taking medication regularly.  Patient's niece says that she is complaining today with some cough and congestion although the patient did not say that.  She is doing well overall with no other complaints.  She will be given an FOBT to return and will get lab work today.  She comes today with her caregiver and her niece.  The patient is followed periodically by the cardiologist.  Her vital signs today are stable.  Her weight is up about 4 pounds since her last visit.  She has a history of heart failure bronchiectasis atrial for up hypothyroidism hypertension and GERD.  Patient comes to the visit today with her niece and caregiver.  She is in good spirits and has no particular complaints although her niece says that she does have slightly more cough and congestion and does keep the house fairly warm at home.  She does have a coolmist humidifier.  The patient herself denies any chest pain pressure tightness or shortness of breath.  She does admit to having some cough.  She is not running any fever.  She denies any trouble with swallowing including heartburn indigestion nausea vomiting diarrhea blood in the stool or black tarry bowel movements.  She is passing her water without problems.  There is a mention that she may not be getting the true effectiveness of her Symbicort and we will recommend that she get an AeroChamber so that it can work more effectively.  She is reminded that she needs to rinse her mouth out after using this.    Patient Active Problem List   Diagnosis Date Noted  . Aortic atherosclerosis ([place]) 12/19/2017  . Bronchiectasis without complication ([place]) 69/45/0388  . Hypothyroidism 03/31/2015  . CHF (congestive heart failure) ([place]) 11/15/2014  . Absolute anemia  10/18/2014  . Bruising, spontaneous 09/16/2014  . Long term current use of anticoagulant 09/16/2014  . Atrial fibrillation ([place]) 04/15/2014  . Edema 04/15/2014  . Nodule of right lung 05/27/2013  . Memory impairment 03/12/2013  . Prophylactic immunotherapy 10/24/2012  . Dyspnea 07/06/2012  . Extrinsic asthma 08/31/2010  . Atrophic vaginitis   . Malaise and fatigue   . Thoracic scoliosis   . Insomnia, idiopathic   . Gastritis   . Gastroesophageal reflux disease with hiatal hernia   . Colon polyps   . Diverticulosis   . Osteoarthrosis and allied disorders   . Hyperlipidemia 02/11/2009  . Essential hypertension 02/10/2009   Outpatient Encounter Medications as of 06/19/2018  Medication Sig  . albuterol (PROAIR HFA) 108 (90 Base) MCG/ACT inhaler Inhale 2 puffs into the lungs every 6 (six) hours as needed.  Marland Kitchen albuterol (PROVENTIL) (2.5 MG/3ML) 0.083% nebulizer solution Take 3 mLs (2.5 mg total) by nebulization every 6 (six) hours as needed for wheezing or shortness of breath.  Marland Kitchen amiodarone (PACERONE) 200 MG tablet TAKE (1/2) TABLET DAILY.  Marland Kitchen Biotin 5 MG TABS Take by mouth.  . budesonide-formoterol (SYMBICORT) 80-4.5 MCG/ACT inhaler Inhale 2 puffs into the lungs 2 (two) times daily.  . busPIRone (BUSPAR) 5 MG tablet TAKE ONE TABLET AT BEDTIME  . calcium-vitamin D (OSCAL WITH D) 500-200 MG-UNIT tablet Take 1 tablet by mouth.  . donepezil (ARICEPT) 10 MG tablet TAKE 1 TABLET DAILY  . furosemide (LASIX) 20  MG tablet TAKE 2 TABLET A DAY AS DIRECTED.  Marland Kitchen levothyroxine (SYNTHROID, LEVOTHROID) 75 MCG tablet TAKE 1 TABLET DAILY  . losartan (COZAAR) 100 MG tablet TAKE 1 TABLET DAILY  . memantine (NAMENDA) 10 MG tablet TAKE (1) TABLET TWICE A DAY.  . metoprolol succinate (TOPROL-XL) 25 MG 24 hr tablet TAKE 1 TABLET DAILY  . Multiple Vitamin (MULTIVITAMIN WITH MINERALS) TABS tablet Take 1 tablet by mouth daily.  Marland Kitchen omeprazole (PRILOSEC) 20 MG capsule Take 1 capsule (20 mg total) by mouth daily.  .  potassium chloride SA (K-DUR,KLOR-CON) 20 MEQ tablet Take 1 tablet (20 mEq total) by mouth daily.  Marland Kitchen venlafaxine XR (EFFEXOR-XR) 37.5 MG 24 hr capsule Take 1 capsule (37.5 mg total) by mouth daily with breakfast.   No facility-administered encounter medications on file as of 06/19/2018.      Review of Systems  Constitutional: Negative.   HENT: Negative.   Eyes: Negative.   Respiratory: Negative.   Cardiovascular: Negative.   Gastrointestinal: Negative.   Endocrine: Negative.   Genitourinary: Negative.   Musculoskeletal: Negative.   Skin: Negative.   Allergic/Immunologic: Negative.   Neurological: Negative.   Hematological: Negative.   Psychiatric/Behavioral: Negative.        Objective:   Physical Exam Vitals signs and nursing note reviewed.  Constitutional:      General: She is not in acute distress.    Appearance: Normal appearance. She is well-developed and normal weight. She is not ill-appearing.     Comments: Patient is pleasant and smiling and seems to be in no acute distress.  HENT:     Head: Normocephalic and atraumatic.     Right Ear: Tympanic membrane, ear canal and external ear normal. There is no impacted cerumen.     Left Ear: Tympanic membrane, ear canal and external ear normal. There is no impacted cerumen.     Nose: Nose normal. No congestion or rhinorrhea.     Comments: Nasal turbinate dryness    Mouth/Throat:     Mouth: Mucous membranes are dry.     Pharynx: Oropharynx is clear.  Eyes:     General: No scleral icterus.       Right eye: No discharge.        Left eye: No discharge.     Conjunctiva/sclera: Conjunctivae normal.     Pupils: Pupils are equal, round, and reactive to light.  Neck:     Musculoskeletal: Normal range of motion and neck supple.     Thyroid: No thyromegaly.     Vascular: No carotid bruit or JVD.     Comments: No bruits thyromegaly or anterior cervical adenopathy or tenderness. Cardiovascular:     Rate and Rhythm: Normal rate and  regular rhythm.     Heart sounds: Normal heart sounds. No murmur.     Comments: The heart is regular today at 72/min.  Pulses were diminished bilaterally and there was no edema. Pulmonary:     Effort: Pulmonary effort is normal. No respiratory distress.     Breath sounds: Normal breath sounds. No wheezing or rales.     Comments: Clear anteriorly and posteriorly with dry cough. Abdominal:     General: Abdomen is flat. Bowel sounds are normal.     Palpations: Abdomen is soft. There is no mass.     Tenderness: There is no abdominal tenderness.  Musculoskeletal:        General: No tenderness or deformity.     Right lower leg: No edema.  Left lower leg: No edema.     Comments: Patient uses a walker for ambulation  Lymphadenopathy:     Cervical: No cervical adenopathy.  Skin:    General: Skin is warm and dry.     Findings: No rash.  Neurological:     General: No focal deficit present.     Mental Status: She is alert and oriented to person, place, and time. Mental status is at baseline.     Cranial Nerves: No cranial nerve deficit.     Motor: No weakness.     Deep Tendon Reflexes: Reflexes are normal and symmetric.  Psychiatric:        Mood and Affect: Mood normal.        Behavior: Behavior normal.        Thought Content: Thought content normal.        Judgment: Judgment normal.     Comments: Mood affect and behavior for this patient appeared to be normal     BP (!) 120/59 (BP Location: Left Arm)   Pulse 64   Temp 98.9 F (37.2 C) (Oral)   Ht 5' (1.524 m)   Wt 151 lb (68.5 kg)   BMI 29.49 kg/m        Assessment & Plan:  1. Pure hypercholesterolemia -Continue aggressive therapeutic lifestyle changes as much as possible - CBC with Differential/Platelet - Lipid panel  2. Hypothyroidism due to acquired atrophy of thyroid -Continue with thyroid replacement pending results of lab work - CBC with Differential/Platelet - Thyroid Panel With TSH  3. Essential  hypertension -Blood pressure is good today and she will continue with current treatment - BMP8+EGFR - CBC with Differential/Platelet - Hepatic function panel  4. Paroxysmal atrial fibrillation ([facility]) -Follow-up with cardiology as planned - CBC with Differential/Platelet  5. Gastroesophageal reflux disease, esophagitis presence not specified -No complaints today with reflux.  She will continue with current treatment - CBC with Differential/Platelet - Hepatic function panel  6. Vitamin D deficiency -Continue with vitamin D replacement pending results of lab work - CBC with Differential/Platelet - VITAMIN D 25 Hydroxy (Vit-D Deficiency, Fractures)  7. Left leg swelling -No edema and no signs of any increased pulmonary congestion. - furosemide (LASIX) 20 MG tablet; TAKE 2 TABLET A DAY AS DIRECTED.  Dispense: 180 tablet; Refill: 3  8. Nodule of right lung - budesonide-formoterol (SYMBICORT) 80-4.5 MCG/ACT inhaler; Inhale 2 puffs into the lungs 2 (two) times daily.  Dispense: 1 Inhaler; Refill: 11  9. Wheezing -Continue with inhalers using AeroChamber - albuterol (PROAIR HFA) 108 (90 Base) MCG/ACT inhaler; Inhale 2 puffs into the lungs every 6 (six) hours as needed.  Dispense: 1 Inhaler; Refill: 11  10. Memory impairment -Memory issues appear to be stable  11. Aortic atherosclerosis ([facility]) -Continue with as aggressive therapeutic lifestyle changes as possible  12. Mild intermittent asthmatic bronchitis  -Continue to use Mucinex inhalers regularly and drink plenty of fluids  Patient Instructions  Family was instructed to get patient an AeroChamber. She should use this with her Symbicort. She should rinse her mouth out after using the Symbicort each time. She should drink more water and fluids and keep the house as cool as possible She should use the coolmist humidifier if she is currently doing She should use nasal saline frequently in each nostril She should continue to  follow-up with the cardiologist as planned She needs to use good hand hygiene and respiratory hygiene especially the next few weeks.  Arrie Senate MD

## 2018-06-19 NOTE — Patient Instructions (Signed)
Family was instructed to get patient an AeroChamber. She should use this with her Symbicort. She should rinse her mouth out after using the Symbicort each time. She should drink more water and fluids and keep the house as cool as possible She should use the coolmist humidifier if she is currently doing She should use nasal saline frequently in each nostril She should continue to follow-up with the cardiologist as planned She needs to use good hand hygiene and respiratory hygiene especially the next few weeks.

## 2018-06-19 NOTE — Addendum Note (Signed)
Addended by: Magdalene River on: 06/19/2018 11:54 AM   Modules accepted: Orders

## 2018-06-20 LAB — LIPID PANEL
CHOLESTEROL TOTAL: 212 mg/dL — AB (ref 100–199)
Chol/HDL Ratio: 3.5 ratio (ref 0.0–4.4)
HDL: 61 mg/dL (ref >39)
LDL Calculated: 127 mg/dL — ABNORMAL HIGH (ref 0–99)
Triglycerides: 121 mg/dL (ref 0–149)
VLDL Cholesterol Cal: 24 mg/dL (ref 5–40)

## 2018-06-20 LAB — THYROID PANEL WITH TSH
Free Thyroxine Index: 3.2 (ref 1.2–4.9)
T3 Uptake Ratio: 40 % — ABNORMAL HIGH (ref 24–39)
T4, Total: 8.1 ug/dL (ref 4.5–12.0)
TSH: 1.87 u[IU]/mL (ref 0.450–4.500)

## 2018-06-20 LAB — CBC WITH DIFFERENTIAL/PLATELET
Basophils Absolute: 0 10*3/uL (ref 0.0–0.2)
Basos: 1 %
EOS (ABSOLUTE): 0.1 10*3/uL (ref 0.0–0.4)
Eos: 1 %
Hematocrit: 31.4 % — ABNORMAL LOW (ref 34.0–46.6)
Hemoglobin: 10.2 g/dL — ABNORMAL LOW (ref 11.1–15.9)
IMMATURE GRANS (ABS): 0 10*3/uL (ref 0.0–0.1)
Immature Granulocytes: 0 %
Lymphocytes Absolute: 1.3 10*3/uL (ref 0.7–3.1)
Lymphs: 16 %
MCH: 28.5 pg (ref 26.6–33.0)
MCHC: 32.5 g/dL (ref 31.5–35.7)
MCV: 88 fL (ref 79–97)
Monocytes Absolute: 0.8 10*3/uL (ref 0.1–0.9)
Monocytes: 11 %
Neutrophils Absolute: 5.6 10*3/uL (ref 1.4–7.0)
Neutrophils: 71 %
Platelets: 227 10*3/uL (ref 150–450)
RBC: 3.58 x10E6/uL — ABNORMAL LOW (ref 3.77–5.28)
RDW: 12.8 % (ref 11.7–15.4)
WBC: 7.8 10*3/uL (ref 3.4–10.8)

## 2018-06-20 LAB — HEPATIC FUNCTION PANEL
ALBUMIN: 4.1 g/dL (ref 3.5–4.6)
ALT: 14 IU/L (ref 0–32)
AST: 20 IU/L (ref 0–40)
Alkaline Phosphatase: 58 IU/L (ref 39–117)
Bilirubin Total: 0.3 mg/dL (ref 0.0–1.2)
Bilirubin, Direct: 0.11 mg/dL (ref 0.00–0.40)
Total Protein: 6.2 g/dL (ref 6.0–8.5)

## 2018-06-20 LAB — BMP8+EGFR
BUN/Creatinine Ratio: 16 (ref 12–28)
BUN: 20 mg/dL (ref 10–36)
CALCIUM: 9.5 mg/dL (ref 8.7–10.3)
CO2: 24 mmol/L (ref 20–29)
Chloride: 103 mmol/L (ref 96–106)
Creatinine, Ser: 1.24 mg/dL — ABNORMAL HIGH (ref 0.57–1.00)
GFR calc non Af Amer: 38 mL/min/{1.73_m2} — ABNORMAL LOW (ref >59)
GFR, EST AFRICAN AMERICAN: 44 mL/min/{1.73_m2} — AB (ref >59)
Glucose: 92 mg/dL (ref 65–99)
Potassium: 4.7 mmol/L (ref 3.5–5.2)
Sodium: 143 mmol/L (ref 134–144)

## 2018-06-20 LAB — VITAMIN D 25 HYDROXY (VIT D DEFICIENCY, FRACTURES): Vit D, 25-Hydroxy: 36.7 ng/mL (ref 30.0–100.0)

## 2018-06-26 ENCOUNTER — Telehealth: Payer: Self-pay | Admitting: *Deleted

## 2018-06-26 NOTE — Telephone Encounter (Signed)
Spoke with family  Some wheezing  Cough is bad Nasal drainage Seems to have more head congestion than chest Drinking a lot of water  Doing mucinex Saline nasal spray= does not like it  Send to [facility].

## 2018-06-27 MED ORDER — DOXYCYCLINE HYCLATE 100 MG PO TABS: 100.0000 mg | ORAL_TABLET | Freq: Two times a day (BID) | ORAL | 0 refills | Status: DC

## 2018-06-27 NOTE — Addendum Note (Signed)
Addended by: Jannifer Rodney A on: 06/27/2018 12:43 PM   Modules accepted: Orders

## 2018-06-27 NOTE — Telephone Encounter (Signed)
Doxycycline Prescription sent to pharmacy   

## 2018-06-30 NOTE — Telephone Encounter (Signed)
Pharmacy delivered medication on the fourth.

## 2018-08-22 ENCOUNTER — Telehealth: Payer: Self-pay | Admitting: *Deleted

## 2018-08-22 NOTE — Telephone Encounter (Signed)
Incoming call from pt's niece Niece was called to pt's residence this AM Niece found pt lying in the floor Uncertain of time of fall but stated pt had on clothes from yesterday Niece also states pt was noticeably confused Pt also complaining of headache Due to confusion and fall, instructed niece to take pt to ED for evaluation Niece verbalizes understanding

## 2018-09-01 ENCOUNTER — Other Ambulatory Visit: Payer: Self-pay | Admitting: *Deleted

## 2018-09-01 ENCOUNTER — Other Ambulatory Visit: Payer: Self-pay

## 2018-09-01 ENCOUNTER — Ambulatory Visit: Payer: Medicare Other

## 2018-09-01 DIAGNOSIS — Z789 Other specified health status: Secondary | ICD-10-CM

## 2018-09-01 MED ORDER — THERA PO TABS
1.00 | ORAL_TABLET | ORAL | Status: DC
Start: 2018-08-31 — End: 2018-09-01

## 2018-09-01 MED ORDER — GENERIC EXTERNAL MEDICATION
Status: DC
Start: ? — End: 2018-09-01

## 2018-09-01 MED ORDER — ACETAMINOPHEN 325 MG PO TABS
650.00 | ORAL_TABLET | ORAL | Status: DC
Start: ? — End: 2018-09-01

## 2018-09-01 MED ORDER — VITAMIN D3 25 MCG (1000 UNIT) PO TABS
1000.00 | ORAL_TABLET | ORAL | Status: DC
Start: 2018-08-31 — End: 2018-09-01

## 2018-09-01 MED ORDER — PANTOPRAZOLE SODIUM 40 MG PO TBEC
40.00 | DELAYED_RELEASE_TABLET | ORAL | Status: DC
Start: 2018-08-30 — End: 2018-09-01

## 2018-09-01 MED ORDER — DONEPEZIL HCL 5 MG PO TABS
10.00 | ORAL_TABLET | ORAL | Status: DC
Start: 2018-08-30 — End: 2018-09-01

## 2018-09-01 MED ORDER — CALCIUM CARBONATE-VITAMIN D 600-400 MG-UNIT PO TABS
1.00 | ORAL_TABLET | ORAL | Status: DC
Start: 2018-08-31 — End: 2018-09-01

## 2018-09-01 MED ORDER — ALBUTEROL SULFATE (2.5 MG/3ML) 0.083% IN NEBU
2.50 | INHALATION_SOLUTION | RESPIRATORY_TRACT | Status: DC
Start: ? — End: 2018-09-01

## 2018-09-01 MED ORDER — HYDROCODONE-ACETAMINOPHEN 10-325 MG PO TABS
1.00 | ORAL_TABLET | ORAL | Status: DC
Start: ? — End: 2018-09-01

## 2018-09-01 MED ORDER — HYDROCODONE-ACETAMINOPHEN 5-325 MG PO TABS
1.00 | ORAL_TABLET | ORAL | Status: DC
Start: ? — End: 2018-09-01

## 2018-09-01 MED ORDER — MEMANTINE HCL 10 MG PO TABS
10.00 | ORAL_TABLET | ORAL | Status: DC
Start: 2018-08-30 — End: 2018-09-01

## 2018-09-01 MED ORDER — BUDESONIDE-FORMOTEROL FUMARATE 80-4.5 MCG/ACT IN AERO
2.00 | INHALATION_SPRAY | RESPIRATORY_TRACT | Status: DC
Start: 2018-08-30 — End: 2018-09-01

## 2018-09-01 MED ORDER — HYDRALAZINE HCL 20 MG/ML IJ SOLN
10.00 | INTRAMUSCULAR | Status: DC
Start: ? — End: 2018-09-01

## 2018-09-01 MED ORDER — AMIODARONE HCL 200 MG PO TABS
100.00 | ORAL_TABLET | ORAL | Status: DC
Start: 2018-08-31 — End: 2018-09-01

## 2018-09-01 MED ORDER — LEVOTHYROXINE SODIUM 50 MCG PO TABS
75.00 | ORAL_TABLET | ORAL | Status: DC
Start: 2018-08-31 — End: 2018-09-01

## 2018-09-01 MED ORDER — SODIUM CHLORIDE 0.9 % IV SOLN
10.00 | INTRAVENOUS | Status: DC
Start: ? — End: 2018-09-01

## 2018-09-01 MED ORDER — NITROGLYCERIN 0.4 MG SL SUBL
.40 | SUBLINGUAL_TABLET | SUBLINGUAL | Status: DC
Start: ? — End: 2018-09-01

## 2018-09-01 MED ORDER — BUSPIRONE HCL 5 MG PO TABS
5.00 | ORAL_TABLET | ORAL | Status: DC
Start: 2018-08-30 — End: 2018-09-01

## 2018-09-01 MED ORDER — DOCUSATE SODIUM 100 MG PO CAPS
100.00 | ORAL_CAPSULE | ORAL | Status: DC
Start: 2018-08-31 — End: 2018-09-01

## 2018-09-01 MED ORDER — METOPROLOL SUCCINATE ER 25 MG PO TB24
25.00 | ORAL_TABLET | ORAL | Status: DC
Start: 2018-08-31 — End: 2018-09-01

## 2018-09-01 MED ORDER — BISACODYL 10 MG RE SUPP
10.00 | RECTAL | Status: DC
Start: ? — End: 2018-09-01

## 2018-09-01 MED ORDER — ACETAMINOPHEN 325 MG PO TABS
650.00 | ORAL_TABLET | ORAL | Status: DC
Start: 2018-08-30 — End: 2018-09-01

## 2018-09-01 NOTE — Progress Notes (Signed)
DME 

## 2018-09-06 ENCOUNTER — Other Ambulatory Visit: Payer: Self-pay | Admitting: *Deleted

## 2018-09-06 ENCOUNTER — Telehealth: Payer: Self-pay | Admitting: Family Medicine

## 2018-09-06 DIAGNOSIS — I10 Essential (primary) hypertension: Secondary | ICD-10-CM

## 2018-09-06 NOTE — Telephone Encounter (Signed)
Spoke with NP = a order was faxed to check BP QD and report highs and lows. And to fax readings in 2 weeks to me.

## 2018-09-06 NOTE — Telephone Encounter (Signed)
Patient has a follow up appointment scheduled. 

## 2018-09-07 ENCOUNTER — Other Ambulatory Visit: Payer: Self-pay | Admitting: *Deleted

## 2018-09-07 ENCOUNTER — Ambulatory Visit: Payer: Medicare Other | Admitting: Family Medicine

## 2018-09-07 ENCOUNTER — Other Ambulatory Visit: Payer: Self-pay

## 2018-09-07 MED ORDER — VENLAFAXINE HCL ER 37.5 MG PO CP24: 37.5000 mg | ORAL_CAPSULE | Freq: Every day | ORAL | 3 refills | Status: DC

## 2018-09-07 MED ORDER — EYE-VITES PO TABS: 1.0000 | ORAL_TABLET | Freq: Every morning | ORAL | 3 refills | Status: DC

## 2018-11-10 ENCOUNTER — Encounter: Payer: Self-pay | Admitting: *Deleted

## 2018-12-06 ENCOUNTER — Telehealth: Payer: Self-pay | Admitting: Family Medicine

## 2018-12-06 NOTE — Telephone Encounter (Signed)
Has an apt with Dr. Darnell Level 8/24- just a Juluis Rainier

## 2018-12-15 ENCOUNTER — Other Ambulatory Visit: Payer: Self-pay

## 2018-12-18 ENCOUNTER — Ambulatory Visit (INDEPENDENT_AMBULATORY_CARE_PROVIDER_SITE_OTHER): Payer: Medicare Other | Admitting: Family Medicine

## 2018-12-18 ENCOUNTER — Ambulatory Visit (HOSPITAL_COMMUNITY)
Admission: RE | Admit: 2018-12-18 | Discharge: 2018-12-18 | Disposition: A | Discharge status: Home / Self Care | Payer: Medicare Other | Source: Ambulatory Visit | Attending: Family Medicine | Admitting: Family Medicine

## 2018-12-18 ENCOUNTER — Encounter: Payer: Self-pay | Admitting: Family Medicine

## 2018-12-18 ENCOUNTER — Other Ambulatory Visit: Payer: Self-pay | Admitting: *Deleted

## 2018-12-18 ENCOUNTER — Ambulatory Visit: Payer: Medicare Other | Admitting: Family Medicine

## 2018-12-18 ENCOUNTER — Other Ambulatory Visit: Payer: Self-pay | Admitting: Family Medicine

## 2018-12-18 ENCOUNTER — Other Ambulatory Visit: Payer: Self-pay

## 2018-12-18 VITALS — BP 147/68 | HR 80 | Temp 99.1°F

## 2018-12-18 DIAGNOSIS — W19XXXA Unspecified fall, initial encounter: Secondary | ICD-10-CM

## 2018-12-18 DIAGNOSIS — N183 Chronic kidney disease, stage 3 unspecified: Secondary | ICD-10-CM | POA: Insufficient documentation

## 2018-12-18 DIAGNOSIS — M79651 Pain in right thigh: Secondary | ICD-10-CM

## 2018-12-18 DIAGNOSIS — I10 Essential (primary) hypertension: Secondary | ICD-10-CM

## 2018-12-18 DIAGNOSIS — E034 Atrophy of thyroid (acquired): Secondary | ICD-10-CM

## 2018-12-18 DIAGNOSIS — M25551 Pain in right hip: Secondary | ICD-10-CM | POA: Insufficient documentation

## 2018-12-18 NOTE — Progress Notes (Signed)
Please call to give verbal order to [facility] to ice the affected area on right hip/ thigh 3 times daily for the next 10 days.    I reviewed the xrays with her daughter.  She is aware of recommendations and reason for follow up. She will contact me in 2 weeks if no significant improvement in pain/ function.  Low threshold to re-image if worsening.  Anaiz Qazi M. Lajuana Ripple, [facility]

## 2018-12-18 NOTE — Progress Notes (Signed)
Subjective: CC: fall PCP: Chipper Herb, MD UUV:OZDGUY Carlean Jews Azucena is a 85 y.o. female presenting to clinic today for:  1. Fall Patient is sustained an unwitnessed fall at her nursing facility on Friday.  She is found laughing on the floor.  She had a mild right sided thigh pain at that point but her niece, Vaughan Basta who is her power of attorney, notes that she is not ambulating with a walker as she was previously.  She is needing a wheelchair to get around.  She has a history of fall with subsequent fracture on the right side previously.  She has a history of atrial fibrillation.  Patient does not report any heart palpitations, dizziness or pain elsewhere in her body outside of baseline.  She denies any bruising or soft tissue swelling.  She has not been evaluated by x-ray since the fall.  She does have a history of dementia and is currently treated with both Namenda and Aricept.  Denies hitting head.  2.  Hypothyroidism Patient is compliant with Synthroid 75 mcg.  This is administered by her facility.  Again, does not report any heart palpitations.  Of note she is treated with amiodarone.  3.  Atrial fibrillation/CHF Patient is due for follow-up with her cardiologist.  She is treated with amiodarone, Lasix, metoprolol and potassium.  No chest pain or shortness of breath.     ROS: Per HPI  Allergies  Allergen Reactions  . Ace Inhibitors Cough  . Zocor [Simvastatin] Other (See Comments)    Leg pain   Past Medical History:  Diagnosis Date  . Abdominal pain   . Atrophic vaginitis   . Colon polyps    Dr Watt Climes  . Diverticulosis    Dr Watt Climes  . Essential hypertension, benign   . Gastritis   . Hiatal hernia   . Insomnia, idiopathic   . Malaise and fatigue   . Osteoarthrosis and allied disorders   . Other and unspecified hyperlipidemia   . Thoracic scoliosis     Current Outpatient Medications:  .  albuterol (PROAIR HFA) 108 (90 Base) MCG/ACT inhaler, Inhale 2 puffs into the lungs  every 6 (six) hours as needed., Disp: 1 Inhaler, Rfl: 11 .  albuterol (PROVENTIL) (2.5 MG/3ML) 0.083% nebulizer solution, Take 3 mLs (2.5 mg total) by nebulization every 6 (six) hours as needed for wheezing or shortness of breath., Disp: 150 mL, Rfl: 11 .  amiodarone (PACERONE) 200 MG tablet, TAKE (1/2) TABLET DAILY., Disp: 45 tablet, Rfl: 3 .  Biotin 5 MG TABS, Take by mouth., Disp: , Rfl:  .  budesonide-formoterol (SYMBICORT) 80-4.5 MCG/ACT inhaler, Inhale 2 puffs into the lungs 2 (two) times daily., Disp: 1 Inhaler, Rfl: 11 .  busPIRone (BUSPAR) 5 MG tablet, Take 1 tablet (5 mg total) by mouth at bedtime., Disp: 90 tablet, Rfl: 3 .  calcium-vitamin D (OSCAL WITH D) 500-200 MG-UNIT tablet, Take 1 tablet by mouth., Disp: , Rfl:  .  donepezil (ARICEPT) 10 MG tablet, Take 1 tablet (10 mg total) by mouth daily., Disp: 90 tablet, Rfl: 3 .  doxycycline (VIBRA-TABS) 100 MG tablet, Take 1 tablet (100 mg total) by mouth 2 (two) times daily., Disp: 20 tablet, Rfl: 0 .  furosemide (LASIX) 20 MG tablet, TAKE 2 TABLET A DAY AS DIRECTED., Disp: 180 tablet, Rfl: 3 .  levothyroxine (SYNTHROID, LEVOTHROID) 75 MCG tablet, Take 1 tablet (75 mcg total) by mouth daily., Disp: 90 tablet, Rfl: 3 .  memantine (NAMENDA) 10 MG tablet, TAKE (  1) TABLET TWICE A DAY., Disp: 180 tablet, Rfl: 3 .  metoprolol succinate (TOPROL-XL) 25 MG 24 hr tablet, Take 1 tablet (25 mg total) by mouth daily., Disp: 90 tablet, Rfl: 3 .  Multiple Vitamin (MULTIVITAMIN WITH MINERALS) TABS tablet, Take 1 tablet by mouth daily., Disp: , Rfl:  .  Multiple Vitamins-Minerals (EYE-VITES) TABS, Take 1 tablet by mouth every morning., Disp: 90 tablet, Rfl: 3 .  omeprazole (PRILOSEC) 20 MG capsule, Take 1 capsule (20 mg total) by mouth daily., Disp: 90 capsule, Rfl: 3 .  potassium chloride SA (K-DUR,KLOR-CON) 20 MEQ tablet, Take 1 tablet (20 mEq total) by mouth daily., Disp: 90 tablet, Rfl: 3 .  Spacer/Aero-Holding Chambers DEVI, Use with symbicort inhaler  BID, Disp: 1 each, Rfl: 1 .  venlafaxine XR (EFFEXOR-XR) 37.5 MG 24 hr capsule, Take 1 capsule (37.5 mg total) by mouth at bedtime., Disp: 90 capsule, Rfl: 3 Social History   Socioeconomic History  . Marital status: Divorced    Spouse name: Not on file  . Number of children: Not on file  . Years of education: Not on file  . Highest education level: Not on file  Occupational History  . Occupation: retired  Engineer, productionocial Needs  . Financial resource strain: Not on file  . Food insecurity    Worry: Not on file    Inability: Not on file  . Transportation needs    Medical: Not on file    Non-medical: Not on file  Tobacco Use  . Smoking status: Former Smoker    Packs/day: 0.10    Years: 5.00    Pack years: 0.50    Types: Cigarettes  . Smokeless tobacco: Never Used  . Tobacco comment: pt states only smoke socially in teens  Substance and Sexual Activity  . Alcohol use: No  . Drug use: No  . Sexual activity: Not on file  Lifestyle  . Physical activity    Days per week: Not on file    Minutes per session: Not on file  . Stress: Not on file  Relationships  . Social Musicianconnections    Talks on phone: Not on file    Gets together: Not on file    Attends religious service: Not on file    Active member of club or organization: Not on file    Attends meetings of clubs or organizations: Not on file    Relationship status: Not on file  . Intimate partner violence    Fear of current or ex partner: Not on file    Emotionally abused: Not on file    Physically abused: Not on file    Forced sexual activity: Not on file  Other Topics Concern  . Not on file  Social History Narrative  . Not on file   Family History  Problem Relation Age of Onset  . Heart disease Father 1657  . Cancer Sister     Objective: Office vital signs reviewed. BP (!) 147/68   Pulse 80   Temp 99.1 F (37.3 C) (Oral)   Physical Examination:  General: Awake, alert, well nourished, slightly disheveled elderly female.  No acute distress HEENT: [place]/AT, no goiter or exophthalmos. Cardio: Seems to be regular rate and rhythm today, S1S2 heard, no murmurs appreciated Pulm: clear to auscultation bilaterally, no wheezes, rhonchi or rales; normal work of breathing on room air MSK: Arrives in wheelchair.  She has tenderness to palpation along the right trochanter and right anterior thigh.  There are no palpable bony defects.  Her active range of motion is limited secondary to pain on the right.  She is able to flex and extend the left lower extremity.  Assessment/ Plan: 85 y.o. female   1. Essential hypertension Borderline.  Continue current regimen.  Possibly elevated due to pain  2. Hypothyroidism due to acquired atrophy of thyroid Asymptomatic.  Check thyroid - Thyroid Panel With TSH  3. Chronic kidney disease (CKD), stage III (moderate) (HCC) Check kidney function and hemoglobin - Basic Metabolic Panel - CBC  4. Fall, initial encounter Stat imaging of the right hip and thigh ordered.  We will place referral pending these imaging studies - DG Hip Unilat W OR W/O Pelvis 2-3 Views Right; Future - DG FEMUR, MIN 2 VIEWS RIGHT; Future  5. Right thigh pain - DG Hip Unilat W OR W/O Pelvis 2-3 Views Right; Future - DG FEMUR, MIN 2 VIEWS RIGHT; Future   Orders Placed This Encounter  Procedures  . Basic Metabolic Panel  . CBC  . Thyroid Panel With TSH   No orders of the defined types were placed in this encounter.    Raliegh IpAshly M Ireoluwa Grant, DO [facility] [phone]

## 2018-12-18 NOTE — Patient Instructions (Signed)
Xrays can be done at [facility].  You do not need an appointment.   Go through the main enter ance and let them know you are there for xray.  You had labs performed today.  You will be contacted with the results of the labs once they are available, usually in the next 3 business days for routine lab work.  If you have an active my chart account, they will be released to your MyChart.  If you prefer to have these labs released to you via telephone, please let us know.  If you had a pap smear or biopsy performed, expect to be contacted in about 7-10 days.

## 2018-12-19 ENCOUNTER — Other Ambulatory Visit: Payer: Self-pay | Admitting: Family Medicine

## 2018-12-19 DIAGNOSIS — D649 Anemia, unspecified: Secondary | ICD-10-CM

## 2018-12-19 LAB — CBC
Hematocrit: 27 % — ABNORMAL LOW (ref 34.0–46.6)
Hemoglobin: 8.5 g/dL — CL (ref 11.1–15.9)
MCH: 25.7 pg — ABNORMAL LOW (ref 26.6–33.0)
MCHC: 31.5 g/dL (ref 31.5–35.7)
MCV: 82 fL (ref 79–97)
Platelets: 201 10*3/uL (ref 150–450)
RBC: 3.31 x10E6/uL — ABNORMAL LOW (ref 3.77–5.28)
RDW: 14.7 % (ref 11.7–15.4)
WBC: 7.9 10*3/uL (ref 3.4–10.8)

## 2018-12-19 LAB — BASIC METABOLIC PANEL
BUN/Creatinine Ratio: 20 (ref 12–28)
BUN: 21 mg/dL (ref 10–36)
CO2: 25 mmol/L (ref 20–29)
Calcium: 9 mg/dL (ref 8.7–10.3)
Chloride: 99 mmol/L (ref 96–106)
Creatinine, Ser: 1.04 mg/dL — ABNORMAL HIGH (ref 0.57–1.00)
GFR calc Af Amer: 54 mL/min/{1.73_m2} — ABNORMAL LOW (ref >59)
GFR calc non Af Amer: 47 mL/min/{1.73_m2} — ABNORMAL LOW (ref >59)
Glucose: 102 mg/dL — ABNORMAL HIGH (ref 65–99)
Potassium: 4.1 mmol/L (ref 3.5–5.2)
Sodium: 139 mmol/L (ref 134–144)

## 2018-12-19 LAB — THYROID PANEL WITH TSH
Free Thyroxine Index: 3 (ref 1.2–4.9)
T3 Uptake Ratio: 33 % (ref 24–39)
T4, Total: 9.1 ug/dL (ref 4.5–12.0)
TSH: 4.28 u[IU]/mL (ref 0.450–4.500)

## 2018-12-19 NOTE — Progress Notes (Signed)
done

## 2018-12-20 ENCOUNTER — Telehealth: Payer: Self-pay | Admitting: Family Medicine

## 2018-12-20 NOTE — Telephone Encounter (Signed)
Refer to lab notes 

## 2019-01-09 ENCOUNTER — Ambulatory Visit (INDEPENDENT_AMBULATORY_CARE_PROVIDER_SITE_OTHER): Payer: Medicare Other | Admitting: Nurse Practitioner

## 2019-01-30 ENCOUNTER — Telehealth: Payer: Self-pay | Admitting: Family Medicine

## 2019-01-30 NOTE — Telephone Encounter (Signed)
[id number] stated they found patient in the floor around 2pm today. Pt states she just fell trying to get out of the bed. Patient reported that she was fine not sore at all no injury's sustained. They will keep and eye on her and call us back if they needs [place]. FYI

## 2019-01-30 NOTE — Telephone Encounter (Signed)
Thank you.   If she develops any concerning symptoms or signs, low threshold to have evaluated in ED.

## 2019-02-19 ENCOUNTER — Telehealth: Payer: Self-pay | Admitting: Family Medicine

## 2019-02-19 NOTE — Telephone Encounter (Signed)
Power of attorney, [name], was given back three bottles of medication by [facility] and wanted to see if we have these medications on her med list that she is supposed to be taking.  The meds were Losartan, Lasix and Klor-con.  The Lasix and Klor-con are on her medication list with [place] but the Losartan was not.  It appears that the Losartan was discontinued in May and at that time Dr. Laurance Flatten requested that [facility] monitor her blood pressure for 2 weeks and send the readings to him.  I was unable to locate the two weeks of readings in patient's chart.  Since the med technician is not at [facility] at this time, I will contact her and ask her to fax those readings to me and call the power of attorney to let her know what I find.

## 2019-02-20 NOTE — Telephone Encounter (Signed)
I contacted [facility].  They do not show where a BP log was sent to [place] in May for patient.  Reports patient's blood pressures are normal now other than a couple of times it was slightly elevated.  Reported this information to [employer].

## 2019-04-11 ENCOUNTER — Ambulatory Visit (INDEPENDENT_AMBULATORY_CARE_PROVIDER_SITE_OTHER): Payer: Medicare Other | Admitting: Family Medicine

## 2019-04-11 ENCOUNTER — Telehealth: Payer: Self-pay

## 2019-04-11 DIAGNOSIS — J988 Other specified respiratory disorders: Secondary | ICD-10-CM

## 2019-04-11 DIAGNOSIS — R062 Wheezing: Secondary | ICD-10-CM

## 2019-04-11 DIAGNOSIS — U071 COVID-19: Secondary | ICD-10-CM | POA: Diagnosis not present

## 2019-04-11 MED ORDER — DEXAMETHASONE 4 MG PO TABS: 4.0000 mg | ORAL_TABLET | Freq: Every day | ORAL | 0 refills | Status: AC

## 2019-04-11 NOTE — Telephone Encounter (Signed)
Resident has tested positive for Covid.  Oxygen level is 92%, no fever, all other vitals are normal.  What do you recommend, do you want to order oxygen?

## 2019-04-11 NOTE — Progress Notes (Signed)
Telephone visit  Subjective: CC: wheezing PCP: Janora Norlander, DO ION:GEXBMW L Buske is a 85 y.o. female calls for telephone consult today. Patient provides verbal consent for consult held via phone.  Due to COVID-19 pandemic this visit was conducted virtually. This visit type was conducted due to national recommendations for restrictions regarding the COVID-19 Pandemic (e.g. social distancing, sheltering in place) in an effort to limit this patient's exposure and mitigate transmission in our community. All issues noted in this document were discussed and addressed.  A physical exam was not performed with this format.   Location of patient: [facility] Location of provider: [facility] Others present for call: [employer], Sabrina  1. COVID19 infection Her med tech noted her O2 saturation was 92% at rest.  She was diagnosed with COVID-19 today.  She reports a dry cough and congestion.  Declined Robitussin.  No fevers.  Vitals were normal.  She denies nausea, vomiting, diarrhea, myalgia, shortness of breath.  She has had some wheezing that is audible by [place].  Breathing does not look labored.  She does have albuterol inhaler on hand if needed  ROS: Per HPI  Allergies  Allergen Reactions  . Ace Inhibitors Cough  . Zocor [Simvastatin] Other (See Comments)    Leg pain   Past Medical History:  Diagnosis Date  . Abdominal pain   . Atrophic vaginitis   . Colon polyps    Dr Watt Climes  . Diverticulosis    Dr Watt Climes  . Essential hypertension, benign   . Gastritis   . Hiatal hernia   . Insomnia, idiopathic   . Malaise and fatigue   . Osteoarthrosis and allied disorders   . Other and unspecified hyperlipidemia   . Thoracic scoliosis     Current Outpatient Medications:  .  albuterol (PROAIR HFA) 108 (90 Base) MCG/ACT inhaler, Inhale 2 puffs into the lungs every 6 (six) hours as needed., Disp: 1 Inhaler, Rfl: 11 .  albuterol (PROVENTIL) (2.5 MG/3ML) 0.083% nebulizer solution,  Take 3 mLs (2.5 mg total) by nebulization every 6 (six) hours as needed for wheezing or shortness of breath., Disp: 150 mL, Rfl: 11 .  amiodarone (PACERONE) 200 MG tablet, TAKE (1/2) TABLET DAILY., Disp: 45 tablet, Rfl: 3 .  Biotin 5 MG TABS, Take by mouth., Disp: , Rfl:  .  budesonide-formoterol (SYMBICORT) 80-4.5 MCG/ACT inhaler, Inhale 2 puffs into the lungs 2 (two) times daily., Disp: 1 Inhaler, Rfl: 11 .  busPIRone (BUSPAR) 5 MG tablet, Take 1 tablet (5 mg total) by mouth at bedtime., Disp: 90 tablet, Rfl: 3 .  calcium-vitamin D (OSCAL WITH D) 500-200 MG-UNIT tablet, Take 1 tablet by mouth., Disp: , Rfl:  .  donepezil (ARICEPT) 10 MG tablet, Take 1 tablet (10 mg total) by mouth daily., Disp: 90 tablet, Rfl: 3 .  furosemide (LASIX) 20 MG tablet, TAKE 2 TABLET A DAY AS DIRECTED., Disp: 180 tablet, Rfl: 3 .  levothyroxine (SYNTHROID, LEVOTHROID) 75 MCG tablet, Take 1 tablet (75 mcg total) by mouth daily., Disp: 90 tablet, Rfl: 3 .  memantine (NAMENDA) 10 MG tablet, TAKE (1) TABLET TWICE A DAY., Disp: 180 tablet, Rfl: 3 .  metoprolol succinate (TOPROL-XL) 25 MG 24 hr tablet, Take 1 tablet (25 mg total) by mouth daily., Disp: 90 tablet, Rfl: 3 .  Multiple Vitamin (MULTIVITAMIN WITH MINERALS) TABS tablet, Take 1 tablet by mouth daily., Disp: , Rfl:  .  Multiple Vitamins-Minerals (EYE-VITES) TABS, Take 1 tablet by mouth every morning., Disp: 90 tablet,  Rfl: 3 .  omeprazole (PRILOSEC) 20 MG capsule, Take 1 capsule (20 mg total) by mouth daily., Disp: 90 capsule, Rfl: 3 .  potassium chloride SA (K-DUR,KLOR-CON) 20 MEQ tablet, Take 1 tablet (20 mEq total) by mouth daily., Disp: 90 tablet, Rfl: 3 .  Spacer/Aero-Holding Chambers DEVI, Use with symbicort inhaler BID, Disp: 1 each, Rfl: 1 .  venlafaxine XR (EFFEXOR-XR) 37.5 MG 24 hr capsule, Take 1 capsule (37.5 mg total) by mouth at bedtime., Disp: 90 capsule, Rfl: 3  Assessment/ Plan: 85 y.o. female   1. Wheezing Given wheezing and positive COVID-19  will empirically treat with dexamethasone.  Use albuterol inhaler 2 puffs every 6 hours scheduled for the next 24 hours then use as needed as directed.  We discussed reasons for emergent evaluation emergency department including oxygenation below 88% on room air and or progression of her symptoms.  Superimposed good understanding.  Recommendations were faxed over to [facility] facility. - dexamethasone (DECADRON) 4 MG tablet; Take 1 tablet (4 mg total) by mouth daily for 5 days.  Dispense: 5 tablet; Refill: 0  2. Respiratory tract infection due to COVID-19 virus - dexamethasone (DECADRON) 4 MG tablet; Take 1 tablet (4 mg total) by mouth daily for 5 days.  Dispense: 5 tablet; Refill: 0   Start time: 4:18pm End time: 4:33pm  Total time spent on patient care (including telephone call/ virtual visit): 19 minutes  Ashly Hulen Skains, DO [facility] [phone]

## 2019-04-11 NOTE — Telephone Encounter (Signed)
Doing televisit now

## 2019-04-11 NOTE — Patient Instructions (Signed)
Patient is to use her albuterol 2 puffs every 6 hours scheduled for the next 24 hours then go back to 2 puffs every 6 hours as needed for wheezing or shortness of breath  Decadron 4 mg daily for 5 days then stop.  If oxygen drops below 88%, she is to seek immediate medical attention emergency department.  If her symptoms worsen, meaning she develops shortness of breath, high fevers, decreased ability to stay hydrated or her vital signs become abnormal, she needs to go to emergency department

## 2019-06-21 ENCOUNTER — Telehealth: Payer: Self-pay | Admitting: *Deleted

## 2019-06-21 NOTE — Telephone Encounter (Signed)
Advised to send to ED for eval.

## 2019-06-25 ENCOUNTER — Telehealth: Payer: Self-pay | Admitting: Family Medicine

## 2019-06-25 DIAGNOSIS — L0390 Cellulitis, unspecified: Secondary | ICD-10-CM

## 2019-06-25 HISTORY — DX: Cellulitis, unspecified: L03.90

## 2019-06-25 NOTE — Telephone Encounter (Signed)
**  [facility] After Hours/ Emergency Line Call**  Patient: [name].  PCP: Raliegh Ip, DO  [facility] nurse calls to inform that patient was found on the floor yesterday morning.  No evidence of injury.  Patient acting her normal self. Vitals within normal range. Recommended that patient be monitored for concerning signs.  Red flags discussed.    Judd Mccubbin M. Nadine Counts, DO

## 2019-07-21 ENCOUNTER — Encounter (HOSPITAL_COMMUNITY): Payer: Self-pay

## 2019-07-21 ENCOUNTER — Emergency Department (HOSPITAL_COMMUNITY)
Admission: EM | Admit: 2019-07-21 | Discharge: 2019-07-22 | Disposition: A | Discharge status: Home / Self Care | Payer: Medicare Other | Source: Home / Self Care | Attending: Emergency Medicine | Admitting: Emergency Medicine

## 2019-07-21 DIAGNOSIS — M79604 Pain in right leg: Secondary | ICD-10-CM | POA: Insufficient documentation

## 2019-07-21 DIAGNOSIS — Z79899 Other long term (current) drug therapy: Secondary | ICD-10-CM | POA: Insufficient documentation

## 2019-07-21 DIAGNOSIS — N183 Chronic kidney disease, stage 3 unspecified: Secondary | ICD-10-CM | POA: Insufficient documentation

## 2019-07-21 DIAGNOSIS — T8141XA Infection following a procedure, superficial incisional surgical site, initial encounter: Secondary | ICD-10-CM | POA: Diagnosis not present

## 2019-07-21 DIAGNOSIS — S81801A Unspecified open wound, right lower leg, initial encounter: Secondary | ICD-10-CM

## 2019-07-21 DIAGNOSIS — N390 Urinary tract infection, site not specified: Secondary | ICD-10-CM | POA: Diagnosis not present

## 2019-07-21 DIAGNOSIS — Z4801 Encounter for change or removal of surgical wound dressing: Secondary | ICD-10-CM | POA: Insufficient documentation

## 2019-07-21 DIAGNOSIS — G309 Alzheimer's disease, unspecified: Secondary | ICD-10-CM | POA: Insufficient documentation

## 2019-07-21 DIAGNOSIS — I129 Hypertensive chronic kidney disease with stage 1 through stage 4 chronic kidney disease, or unspecified chronic kidney disease: Secondary | ICD-10-CM | POA: Insufficient documentation

## 2019-07-21 NOTE — ED Provider Notes (Signed)
[facility] EMERGENCY DEPARTMENT Provider Note   CSN: [id number] Arrival date & time: 07/21/19  2320     History Chief Complaint  Patient presents with  . Leg Pain    [name] is a 86 y.o. female.  LEVEL FIVE CAVEAT DUE TO DEMENTIA   HPI 86 y.o. female with complaints/comments per nursing/medical assistant note, with all such history reviewed for accuracy and confirmed by myself.  The patient's relevant past medical, surgical and social history was reviewed in [place].  HPI Comments: [name] is a 86 y.o. female with a history of Alzheimer's who presents to the Emergency Department due to a possible infected wound of the right thigh.   Per staff at nursing home. Pt is supposed to be non weight bearing due to her recent open reduction of the right femur but due to her history of alzheimer's has been getting out of bed more frequently when nursing is not at her bedside. Three days ago they noted clear/bloody drainage from the wound with increased warmth and redness the past few days. They were unable to get callbacks from ortho and do not have any other staff to evaluate her at the moment so they sent her to the ED for further evaluation. They state she is behaving at her baseline and deny and fevers or decreased appetite.    Past Medical History:  Diagnosis Date  . Abdominal pain   . Atrophic vaginitis   . Colon polyps    Dr Watt Climes  . Diverticulosis    Dr Watt Climes  . Essential hypertension, benign   . Gastritis   . Hiatal hernia   . Insomnia, idiopathic   . Malaise and fatigue   . Osteoarthrosis and allied disorders   . Other and unspecified hyperlipidemia   . Thoracic scoliosis     Patient Active Problem List   Diagnosis Date Noted  . Chronic kidney disease (CKD), stage III (moderate) 12/18/2018  . Aortic atherosclerosis ([place]) 12/19/2017  . Bronchiectasis without complication ([place]) 16/60/6301  . Hypothyroidism 03/31/2015  . CHF  (congestive heart failure) ([place]) 11/15/2014  . Absolute anemia 10/18/2014  . Bruising, spontaneous 09/16/2014  . Long term current use of anticoagulant 09/16/2014  . Atrial fibrillation ([place]) 04/15/2014  . Edema 04/15/2014  . Nodule of right lung 05/27/2013  . Memory impairment 03/12/2013  . Prophylactic immunotherapy 10/24/2012  . Dyspnea 07/06/2012  . Extrinsic asthma 08/31/2010  . Atrophic vaginitis   . Malaise and fatigue   . Thoracic scoliosis   . Insomnia, idiopathic   . Gastritis   . Gastroesophageal reflux disease with hiatal hernia   . Colon polyps   . Diverticulosis   . Osteoarthrosis and allied disorders   . Hyperlipidemia 02/11/2009  . Essential hypertension 02/10/2009    Past Surgical History:  Procedure Laterality Date  . JOINT REPLACEMENT    . left shoulder surgery     . rt knee   10 1 08   Dr Theda Sers  . Rt rotator  cuff repair  4 1 00     OB History   No obstetric history on file.     Family History  Problem Relation Age of Onset  . Heart disease Father 57  . Cancer Sister     Social History   Tobacco Use  . Smoking status: Former Smoker    Packs/day: 0.10    Years: 5.00    Pack years: 0.50    Types: Cigarettes  . Smokeless tobacco:  Never Used  . Tobacco comment: pt states only smoke socially in teens  Substance Use Topics  . Alcohol use: No  . Drug use: No    Home Medications Prior to Admission medications   Medication Sig Start Date End Date Taking? Authorizing Provider  albuterol (PROAIR HFA) 108 (90 Base) MCG/ACT inhaler Inhale 2 puffs into the lungs every 6 (six) hours as needed. 06/19/18   Ernestina Penna, MD  albuterol (PROVENTIL) (2.5 MG/3ML) 0.083% nebulizer solution Take 3 mLs (2.5 mg total) by nebulization every 6 (six) hours as needed for wheezing or shortness of breath. 06/19/18   Ernestina Penna, MD  amiodarone (PACERONE) 200 MG tablet TAKE (1/2) TABLET DAILY. 06/19/18   Ernestina Penna, MD  Biotin 5 MG TABS Take by mouth.     [provider]  budesonide-formoterol (SYMBICORT) 80-4.5 MCG/ACT inhaler Inhale 2 puffs into the lungs 2 (two) times daily. 06/19/18   Ernestina Penna, MD  busPIRone (BUSPAR) 5 MG tablet Take 1 tablet (5 mg total) by mouth at bedtime. 06/19/18   Ernestina Penna, MD  calcium-vitamin D (OSCAL WITH D) 500-200 MG-UNIT tablet Take 1 tablet by mouth.    [provider]  donepezil (ARICEPT) 10 MG tablet Take 1 tablet (10 mg total) by mouth daily. 06/19/18   Ernestina Penna, MD  furosemide (LASIX) 20 MG tablet TAKE 2 TABLET A DAY AS DIRECTED. 06/19/18   Ernestina Penna, MD  levothyroxine (SYNTHROID, LEVOTHROID) 75 MCG tablet Take 1 tablet (75 mcg total) by mouth daily. 06/19/18   Ernestina Penna, MD  memantine (NAMENDA) 10 MG tablet TAKE (1) TABLET TWICE A DAY. 06/19/18   Ernestina Penna, MD  metoprolol succinate (TOPROL-XL) 25 MG 24 hr tablet Take 1 tablet (25 mg total) by mouth daily. 06/19/18   Ernestina Penna, MD  Multiple Vitamin (MULTIVITAMIN WITH MINERALS) TABS tablet Take 1 tablet by mouth daily.    [provider]  Multiple Vitamins-Minerals (EYE-VITES) TABS Take 1 tablet by mouth every morning. 09/07/18   Ernestina Penna, MD  omeprazole (PRILOSEC) 20 MG capsule Take 1 capsule (20 mg total) by mouth daily. 06/19/18   Ernestina Penna, MD  potassium chloride SA (K-DUR,KLOR-CON) 20 MEQ tablet Take 1 tablet (20 mEq total) by mouth daily. 06/19/18   Ernestina Penna, MD  Spacer/Aero-Holding Deretha Emory DEVI Use with symbicort inhaler BID 06/19/18   Ernestina Penna, MD  venlafaxine XR (EFFEXOR-XR) 37.5 MG 24 hr capsule Take 1 capsule (37.5 mg total) by mouth at bedtime. 09/07/18   Ernestina Penna, MD    Allergies    Ace inhibitors and Zocor [simvastatin]  Review of Systems   Review of Systems  Unable to perform ROS: Dementia    Physical Exam Updated Vital Signs BP (!) 130/51   Pulse 60   Temp 99.2 F (37.3 C) (Rectal)   Resp 17   Ht 5\' 6"  (1.676 m)   Wt 63.5 kg   SpO2 98%    BMI 22.60 kg/m   Physical Exam Vitals and nursing note reviewed.  Constitutional:      General: She is not in acute distress.    Appearance: Normal appearance. She is normal weight. She is not ill-appearing, toxic-appearing or diaphoretic.     Comments: Pleasant, elderly, demented caucasian female. She answers questions clearly when asked.   HENT:     Head: Normocephalic and atraumatic.     Right Ear: External ear normal.  Left Ear: External ear normal.     Nose: Nose normal.     Mouth/Throat:     Mouth: Mucous membranes are moist.     Pharynx: Oropharynx is clear.  Eyes:     General: No scleral icterus.       Right eye: No discharge.        Left eye: No discharge.     Extraocular Movements: Extraocular movements intact.     Comments: Pinpoint pupils noted bilaterally. Non responsive to light.  Cardiovascular:     Rate and Rhythm: Normal rate and regular rhythm.     Pulses: Normal pulses.     Heart sounds: Normal heart sounds. No murmur. No friction rub. No gallop.   Pulmonary:     Effort: Pulmonary effort is normal. No respiratory distress.     Breath sounds: Normal breath sounds. No stridor. No wheezing, rhonchi or rales.  Abdominal:     General: Abdomen is flat.     Palpations: Abdomen is soft.     Tenderness: There is no abdominal tenderness.  Musculoskeletal:     Cervical back: Normal range of motion and neck supple. No tenderness.  Skin:    General: Skin is warm and dry.     Comments: Surgical wound noted to the right lateral thigh. No TTP surrounding the site. No discharge appreciated. Please see images below.  Neurological:     Mental Status: She is alert. Mental status is at baseline.  Psychiatric:        Mood and Affect: Mood normal.        Behavior: Behavior normal.          ED Results / Procedures / Treatments   Labs (all labs ordered are listed, but only abnormal results are displayed) Labs Reviewed  COMPREHENSIVE METABOLIC PANEL - Abnormal;  Notable for the following components:      Result Value   Sodium 133 (*)    BUN 24 (*)    Creatinine, Ser 1.46 (*)    Total Protein 6.4 (*)    Albumin 3.3 (*)    GFR calc non Af Amer 31 (*)    GFR calc Af Amer 36 (*)    All other components within normal limits  CBC WITH DIFFERENTIAL/PLATELET - Abnormal; Notable for the following components:   Hemoglobin 11.5 (*)    RDW 16.4 (*)    All other components within normal limits    EKG None  Radiology No results found.  Procedures Procedures (including critical care time)  Medications Ordered in ED Medications - No data to display  ED Course  I have reviewed the triage vital signs and the nursing notes.  Pertinent labs & imaging results that were available during my care of the patient were reviewed by me and considered in my medical decision making (see chart for details).  Clinical Course as of Jul 21 20  Sat Jul 21, 2019  2337 Temp: 99.2 F (37.3 C) [LJ]  Sun Jul 22, 2019  0008 BP(!): 166/58 [LJ]  0021 Elevated compared to prior baseline values  Hemoglobin(!): 11.5 [LJ]    Clinical Course User Index [LJ] Placido Sou, PA-C   MDM Rules/Calculators/A&P                      11:52 PM  Pt presents for evaluation of possible surgical site infection s/p open reduction internal fixation of right supracondylar femur fracture. Mild increased warmth noted overlying the site with mild erythema--see  images above. Discussed with nursing home staff and she is at baseline and has been eating and drinking normally. Temp is 99F orally upon arrival. Rechecked rectal which is 99.73F. Will check basic labs and will reassess.   12:54 AM Labs are reassuring. No leukocytosis.  Patient was also discussed with and evaluated by my attending physician Dr. Rochele Raring.  She agrees that this wound does not appear infected.  We will discuss this with nursing home staff.  Strict return precautions given.  Vital signs stable at time of  discharge.  Note: Portions of this report may have been transcribed using voice recognition software. Every effort was made to ensure accuracy; however, inadvertent computerized transcription errors may be present.    Final Clinical Impression(s) / ED Diagnoses Final diagnoses:  Wound of right lower extremity, initial encounter  Right leg pain    Rx / DC Orders ED Discharge Orders    None       Placido Sou, PA-C 07/22/19 0109    Ward, Layla Maw, DO 07/22/19 0298

## 2019-07-21 NOTE — ED Triage Notes (Signed)
Pt came in [facility] with c/o of leg pain. Pt had a femur fracture that was reduced. Per nurses at facility they believe her suture site is infected. They stated the site was warm to touch and red. Pt is A/Ox4.

## 2019-07-22 ENCOUNTER — Emergency Department (HOSPITAL_COMMUNITY): Payer: Medicare Other

## 2019-07-22 ENCOUNTER — Inpatient Hospital Stay (HOSPITAL_COMMUNITY)
Admission: EM | Admit: 2019-07-22 | Discharge: 2019-07-25 | DRG: 690 | Disposition: A | Discharge status: Skilled Nursing Facility | Payer: Medicare Other | Source: Skilled Nursing Facility | Attending: Family Medicine | Admitting: Family Medicine

## 2019-07-22 DIAGNOSIS — Z86010 Personal history of colonic polyps: Secondary | ICD-10-CM

## 2019-07-22 DIAGNOSIS — L03115 Cellulitis of right lower limb: Secondary | ICD-10-CM | POA: Diagnosis not present

## 2019-07-22 DIAGNOSIS — Z7951 Long term (current) use of inhaled steroids: Secondary | ICD-10-CM

## 2019-07-22 DIAGNOSIS — N183 Chronic kidney disease, stage 3 unspecified: Secondary | ICD-10-CM | POA: Diagnosis present

## 2019-07-22 DIAGNOSIS — K219 Gastro-esophageal reflux disease without esophagitis: Secondary | ICD-10-CM | POA: Diagnosis present

## 2019-07-22 DIAGNOSIS — E039 Hypothyroidism, unspecified: Secondary | ICD-10-CM | POA: Diagnosis present

## 2019-07-22 DIAGNOSIS — N1831 Chronic kidney disease, stage 3a: Secondary | ICD-10-CM | POA: Diagnosis present

## 2019-07-22 DIAGNOSIS — I4891 Unspecified atrial fibrillation: Secondary | ICD-10-CM | POA: Diagnosis present

## 2019-07-22 DIAGNOSIS — G309 Alzheimer's disease, unspecified: Secondary | ICD-10-CM | POA: Diagnosis present

## 2019-07-22 DIAGNOSIS — Z8249 Family history of ischemic heart disease and other diseases of the circulatory system: Secondary | ICD-10-CM

## 2019-07-22 DIAGNOSIS — F0280 Dementia in other diseases classified elsewhere without behavioral disturbance: Secondary | ICD-10-CM | POA: Diagnosis present

## 2019-07-22 DIAGNOSIS — Z79899 Other long term (current) drug therapy: Secondary | ICD-10-CM

## 2019-07-22 DIAGNOSIS — J449 Chronic obstructive pulmonary disease, unspecified: Secondary | ICD-10-CM | POA: Diagnosis present

## 2019-07-22 DIAGNOSIS — A419 Sepsis, unspecified organism: Secondary | ICD-10-CM | POA: Diagnosis present

## 2019-07-22 DIAGNOSIS — Z20822 Contact with and (suspected) exposure to covid-19: Secondary | ICD-10-CM | POA: Diagnosis present

## 2019-07-22 DIAGNOSIS — Z87891 Personal history of nicotine dependence: Secondary | ICD-10-CM

## 2019-07-22 DIAGNOSIS — I700 Atherosclerosis of aorta: Secondary | ICD-10-CM | POA: Diagnosis present

## 2019-07-22 DIAGNOSIS — I480 Paroxysmal atrial fibrillation: Secondary | ICD-10-CM | POA: Diagnosis present

## 2019-07-22 DIAGNOSIS — N390 Urinary tract infection, site not specified: Principal | ICD-10-CM | POA: Diagnosis present

## 2019-07-22 DIAGNOSIS — I129 Hypertensive chronic kidney disease with stage 1 through stage 4 chronic kidney disease, or unspecified chronic kidney disease: Secondary | ICD-10-CM | POA: Diagnosis present

## 2019-07-22 DIAGNOSIS — E785 Hyperlipidemia, unspecified: Secondary | ICD-10-CM | POA: Diagnosis present

## 2019-07-22 DIAGNOSIS — Z96651 Presence of right artificial knee joint: Secondary | ICD-10-CM | POA: Diagnosis present

## 2019-07-22 DIAGNOSIS — G4700 Insomnia, unspecified: Secondary | ICD-10-CM | POA: Diagnosis present

## 2019-07-22 DIAGNOSIS — S0181XA Laceration without foreign body of other part of head, initial encounter: Secondary | ICD-10-CM | POA: Diagnosis present

## 2019-07-22 DIAGNOSIS — Z79890 Hormone replacement therapy (postmenopausal): Secondary | ICD-10-CM

## 2019-07-22 DIAGNOSIS — Y92129 Unspecified place in nursing home as the place of occurrence of the external cause: Secondary | ICD-10-CM

## 2019-07-22 DIAGNOSIS — I10 Essential (primary) hypertension: Secondary | ICD-10-CM | POA: Diagnosis present

## 2019-07-22 DIAGNOSIS — Z888 Allergy status to other drugs, medicaments and biological substances status: Secondary | ICD-10-CM

## 2019-07-22 DIAGNOSIS — W050XXA Fall from non-moving wheelchair, initial encounter: Secondary | ICD-10-CM | POA: Diagnosis present

## 2019-07-22 DIAGNOSIS — Z96612 Presence of left artificial shoulder joint: Secondary | ICD-10-CM | POA: Diagnosis present

## 2019-07-22 LAB — URINALYSIS, ROUTINE W REFLEX MICROSCOPIC
Bilirubin Urine: NEGATIVE
Glucose, UA: NEGATIVE mg/dL
Ketones, ur: NEGATIVE mg/dL
Nitrite: NEGATIVE
Protein, ur: NEGATIVE mg/dL
Specific Gravity, Urine: 1.013 (ref 1.005–1.030)
pH: 6 (ref 5.0–8.0)

## 2019-07-22 LAB — APTT: aPTT: 31 seconds (ref 24–36)

## 2019-07-22 LAB — CBC WITH DIFFERENTIAL/PLATELET
Abs Immature Granulocytes: 0.02 10*3/uL (ref 0.00–0.07)
Abs Immature Granulocytes: 0.08 10*3/uL — ABNORMAL HIGH (ref 0.00–0.07)
Basophils Absolute: 0 10*3/uL (ref 0.0–0.1)
Basophils Absolute: 0 10*3/uL (ref 0.0–0.1)
Basophils Relative: 0 %
Basophils Relative: 0 %
Eosinophils Absolute: 0 10*3/uL (ref 0.0–0.5)
Eosinophils Absolute: 0 10*3/uL (ref 0.0–0.5)
Eosinophils Relative: 0 %
Eosinophils Relative: 1 %
HCT: 35.1 % — ABNORMAL LOW (ref 36.0–46.0)
HCT: 36.7 % (ref 36.0–46.0)
Hemoglobin: 11.3 g/dL — ABNORMAL LOW (ref 12.0–15.0)
Hemoglobin: 11.5 g/dL — ABNORMAL LOW (ref 12.0–15.0)
Immature Granulocytes: 0 %
Immature Granulocytes: 1 %
Lymphocytes Relative: 22 %
Lymphocytes Relative: 4 %
Lymphs Abs: 0.6 10*3/uL — ABNORMAL LOW (ref 0.7–4.0)
Lymphs Abs: 1.7 10*3/uL (ref 0.7–4.0)
MCH: 28 pg (ref 26.0–34.0)
MCH: 28.5 pg (ref 26.0–34.0)
MCHC: 31.3 g/dL (ref 30.0–36.0)
MCHC: 32.2 g/dL (ref 30.0–36.0)
MCV: 88.4 fL (ref 80.0–100.0)
MCV: 89.5 fL (ref 80.0–100.0)
Monocytes Absolute: 0.8 10*3/uL (ref 0.1–1.0)
Monocytes Absolute: 0.9 10*3/uL (ref 0.1–1.0)
Monocytes Relative: 10 %
Monocytes Relative: 6 %
Neutro Abs: 13.9 10*3/uL — ABNORMAL HIGH (ref 1.7–7.7)
Neutro Abs: 5 10*3/uL (ref 1.7–7.7)
Neutrophils Relative %: 67 %
Neutrophils Relative %: 89 %
Platelets: 211 10*3/uL (ref 150–400)
Platelets: 244 10*3/uL (ref 150–400)
RBC: 3.97 MIL/uL (ref 3.87–5.11)
RBC: 4.1 MIL/uL (ref 3.87–5.11)
RDW: 16.3 % — ABNORMAL HIGH (ref 11.5–15.5)
RDW: 16.4 % — ABNORMAL HIGH (ref 11.5–15.5)
WBC: 15.5 10*3/uL — ABNORMAL HIGH (ref 4.0–10.5)
WBC: 7.5 10*3/uL (ref 4.0–10.5)
nRBC: 0 % (ref 0.0–0.2)
nRBC: 0 % (ref 0.0–0.2)

## 2019-07-22 LAB — COMPREHENSIVE METABOLIC PANEL
ALT: 11 U/L (ref 0–44)
ALT: 11 U/L (ref 0–44)
AST: 19 U/L (ref 15–41)
AST: 20 U/L (ref 15–41)
Albumin: 3.3 g/dL — ABNORMAL LOW (ref 3.5–5.0)
Albumin: 3.3 g/dL — ABNORMAL LOW (ref 3.5–5.0)
Alkaline Phosphatase: 77 U/L (ref 38–126)
Alkaline Phosphatase: 88 U/L (ref 38–126)
Anion gap: 10 (ref 5–15)
Anion gap: 9 (ref 5–15)
BUN: 21 mg/dL (ref 8–23)
BUN: 24 mg/dL — ABNORMAL HIGH (ref 8–23)
CO2: 25 mmol/L (ref 22–32)
CO2: 25 mmol/L (ref 22–32)
Calcium: 9.1 mg/dL (ref 8.9–10.3)
Calcium: 9.2 mg/dL (ref 8.9–10.3)
Chloride: 98 mmol/L (ref 98–111)
Chloride: 99 mmol/L (ref 98–111)
Creatinine, Ser: 1.28 mg/dL — ABNORMAL HIGH (ref 0.44–1.00)
Creatinine, Ser: 1.46 mg/dL — ABNORMAL HIGH (ref 0.44–1.00)
GFR calc Af Amer: 36 mL/min — ABNORMAL LOW (ref >60)
GFR calc Af Amer: 42 mL/min — ABNORMAL LOW (ref >60)
GFR calc non Af Amer: 31 mL/min — ABNORMAL LOW (ref >60)
GFR calc non Af Amer: 36 mL/min — ABNORMAL LOW (ref >60)
Glucose, Bld: 138 mg/dL — ABNORMAL HIGH (ref 70–99)
Glucose, Bld: 97 mg/dL (ref 70–99)
Potassium: 4.9 mmol/L (ref 3.5–5.1)
Potassium: 5.1 mmol/L (ref 3.5–5.1)
Sodium: 133 mmol/L — ABNORMAL LOW (ref 135–145)
Sodium: 133 mmol/L — ABNORMAL LOW (ref 135–145)
Total Bilirubin: 0.5 mg/dL (ref 0.3–1.2)
Total Bilirubin: 0.6 mg/dL (ref 0.3–1.2)
Total Protein: 6.1 g/dL — ABNORMAL LOW (ref 6.5–8.1)
Total Protein: 6.4 g/dL — ABNORMAL LOW (ref 6.5–8.1)

## 2019-07-22 LAB — PROTIME-INR
INR: 1.1 (ref 0.8–1.2)
Prothrombin Time: 13.7 seconds (ref 11.4–15.2)

## 2019-07-22 LAB — LACTIC ACID, PLASMA: Lactic Acid, Venous: 1.3 mmol/L (ref 0.5–1.9)

## 2019-07-22 MED ORDER — VANCOMYCIN HCL IN DEXTROSE 1-5 GM/200ML-% IV SOLN
1000.0000 mg | Freq: Once | INTRAVENOUS | Status: AC
  Administered 2019-07-23: 1000 mg via INTRAVENOUS
  Filled 2019-07-22: qty 200

## 2019-07-22 MED ORDER — ACETAMINOPHEN 325 MG PO TABS
650.0000 mg | ORAL_TABLET | Freq: Once | ORAL | Status: AC
  Administered 2019-07-22: 650 mg via ORAL
  Filled 2019-07-22: qty 2

## 2019-07-22 MED ORDER — LIDOCAINE-EPINEPHRINE (PF) 2 %-1:200000 IJ SOLN
10.0000 mL | Freq: Once | INTRAMUSCULAR | Status: AC
  Administered 2019-07-22: 10 mL
  Filled 2019-07-22: qty 20

## 2019-07-22 MED ORDER — SODIUM CHLORIDE 0.9 % IV BOLUS
500.0000 mL | Freq: Once | INTRAVENOUS | Status: AC
  Administered 2019-07-22: 500 mL via INTRAVENOUS

## 2019-07-22 MED ORDER — SODIUM CHLORIDE 0.9 % IV SOLN
1.0000 g | INTRAVENOUS | Status: DC
  Administered 2019-07-22 – 2019-07-23 (×2): 1 g via INTRAVENOUS
  Filled 2019-07-22 (×3): qty 10

## 2019-07-22 NOTE — H&P (Signed)
History and Physical   [name] KDT:[id number] DOB: 05-09-1924 DOA: 07/22/2019  Referring MD/NP/PA: Dr. Rhunette Croft  PCP: Sabrina Ip, DO   Outpatient Specialists: Dr. Jeannett Senior Case  Patient coming from: [facility] side [facility] nursing facility  Chief Complaint: Right leg wound infection  HPI: [name] is a 86 y.o. female with medical history significant of hypertension, osteoarthritis, gastritis, Alzheimer's dementia diverticulitis, who is a resident of skilled facility presenting with right leg swelling and pain at the site of recent surgery right total hip replacement following a femur fracture that was done at [facility].  Patient was seen in the ER yesterday with the same complaint.  Suspected to have hardware infection but no evidence of that.  She has perisuture erythema and mild discharge.  Patient was discharged back to skilled facility but returned back today with worsening symptoms.  She was noted to have laceration at the right side of her scalp and right shoulder pain was reported.  She is currently not on any blood thinners.  Patient is otherwise stable with fever up to 102, leukocytosis and other features of sepsis.  Suspected to have cellulitis around the surgical wound and being admitted to the hospital for management..  ED Course: Temperature 103.1, blood pressure 160/71, pulse 82, respirate of 26, oxygen sat 96% on room air.  White count 15.5 hemoglobin 11.3 and platelets 211.  Sodium 133.  Creatinine 1.28 otherwise chemistry within normal lactic acid 1.3.  This showed large hemoglobin.  Many bacteria with WBC 6-10 but RBCs 11-20 present of yeast.  CT cervical spine and CT head showed over her scalp otherwise no acute findings.  Chest x-ray showed no significant findings.  Urine culture and blood cultures obtained and patient is being admitted for treatment.  Review of Systems: As per HPI otherwise 10 point review of systems negative.    Past Medical History:   Diagnosis Date   Abdominal pain    Atrophic vaginitis    Colon polyps    Dr Ewing Schlein   Diverticulosis    Dr Ewing Schlein   Essential hypertension, benign    Gastritis    Hiatal hernia    Insomnia, idiopathic    Malaise and fatigue    Osteoarthrosis and allied disorders    Other and unspecified hyperlipidemia    Thoracic scoliosis     Past Surgical History:  Procedure Laterality Date   JOINT REPLACEMENT     left shoulder surgery      rt knee   10 1 08   Dr Thomasena Edis   Rt rotator  cuff repair  4 1 00     reports that she has quit smoking. Her smoking use included cigarettes. She has a 0.50 pack-year smoking history. She has never used smokeless tobacco. She reports that she does not drink alcohol or use drugs.  Allergies  Allergen Reactions   Ace Inhibitors Cough    "Allergic," per MAR   Simvastatin Other (See Comments)    Leg pain- "Allergic," per [facility]    Family History  Problem Relation Age of Onset   Heart disease Father 31   Cancer Sister      Prior to Admission medications   Medication Sig Start Date End Date Taking? Authorizing Provider  albuterol (PROAIR HFA) 108 (90 Base) MCG/ACT inhaler Inhale 2 puffs into the lungs every 6 (six) hours as needed. Patient taking differently: Inhale 2 puffs into the lungs every 4 (four) hours as needed for wheezing or shortness of breath.  06/19/18  Yes Ernestina Penna, MD  amiodarone (PACERONE) 200 MG tablet TAKE (1/2) TABLET DAILY. Patient taking differently: Take 200 mg by mouth in the morning.  06/19/18  Yes Ernestina Penna, MD  ARIPiprazole (ABILIFY) 5 MG tablet Take 5 mg by mouth daily.   Yes [provider]  aspirin EC 325 MG tablet Take 325 mg by mouth in the morning and at bedtime. 07/04/19 08/03/19 Yes [provider]  budesonide-formoterol (SYMBICORT) 160-4.5 MCG/ACT inhaler Inhale 2 puffs into the lungs 2 (two) times daily.   Yes [provider]  busPIRone (BUSPAR) 5 MG tablet Take 1  tablet (5 mg total) by mouth at bedtime. 06/19/18  Yes Ernestina Penna, MD  donepezil (ARICEPT) 10 MG tablet Take 1 tablet (10 mg total) by mouth daily. Patient taking differently: Take 10 mg by mouth at bedtime.  06/19/18  Yes Ernestina Penna, MD  furosemide (LASIX) 20 MG tablet TAKE 2 TABLET A DAY AS DIRECTED. Patient taking differently: Take 20 mg by mouth in the morning.  06/19/18  Yes Ernestina Penna, MD  levothyroxine (SYNTHROID) 50 MCG tablet Take 50 mcg by mouth daily before breakfast.   Yes [provider]  losartan (COZAAR) 100 MG tablet Take 100 mg by mouth in the morning.   Yes [provider]  memantine (NAMENDA) 10 MG tablet TAKE (1) TABLET TWICE A DAY. Patient taking differently: Take 10 mg by mouth 2 (two) times daily.  06/19/18  Yes Ernestina Penna, MD  metoprolol succinate (TOPROL-XL) 25 MG 24 hr tablet Take 1 tablet (25 mg total) by mouth daily. Patient taking differently: Take 25 mg by mouth in the morning.  06/19/18  Yes Ernestina Penna, MD  omeprazole (PRILOSEC OTC) 20 MG tablet Take 20 mg by mouth in the morning.   Yes [provider]  oxyCODONE (OXY IR/ROXICODONE) 5 MG immediate release tablet Take 5 mg by mouth every 6 (six) hours as needed (for pain management).   Yes [provider]  potassium chloride SA (K-DUR,KLOR-CON) 20 MEQ tablet Take 1 tablet (20 mEq total) by mouth daily. 06/19/18  Yes Ernestina Penna, MD  senna (SENOKOT) 8.6 MG TABS tablet Take 2 tablets by mouth 2 (two) times daily.   Yes [provider]  albuterol (PROVENTIL) (2.5 MG/3ML) 0.083% nebulizer solution Take 3 mLs (2.5 mg total) by nebulization every 6 (six) hours as needed for wheezing or shortness of breath. Patient not taking: Reported on 07/22/2019 06/19/18   Ernestina Penna, MD  budesonide-formoterol [facility]) 80-4.5 MCG/ACT inhaler Inhale 2 puffs into the lungs 2 (two) times daily. Patient not taking: Reported on 07/22/2019 06/19/18   Ernestina Penna, MD    levothyroxine (SYNTHROID, LEVOTHROID) 75 MCG tablet Take 1 tablet (75 mcg total) by mouth daily. Patient not taking: Reported on 07/22/2019 06/19/18   Ernestina Penna, MD  Multiple Vitamins-Minerals (EYE-VITES) TABS Take 1 tablet by mouth every morning. Patient not taking: Reported on 07/22/2019 09/07/18   Ernestina Penna, MD  omeprazole (PRILOSEC) 20 MG capsule Take 1 capsule (20 mg total) by mouth daily. Patient not taking: Reported on 07/22/2019 06/19/18   Ernestina Penna, MD  Spacer/Aero-Holding Deretha Emory DEVI Use with symbicort inhaler BID 06/19/18   Ernestina Penna, MD  venlafaxine XR (EFFEXOR-XR) 37.5 MG 24 hr capsule Take 1 capsule (37.5 mg total) by mouth at bedtime. Patient not taking: Reported on 07/22/2019 09/07/18   Ernestina Penna, MD    Physical Exam:  Vitals:   07/22/19 2030 07/22/19 2118 07/22/19 2146 07/22/19 2200  BP:  (!) 162/56  (!) 120/49  Pulse: 81 82  77  Resp: (!) 22 (!) 26  19  Temp: (!) 103.1 F (39.5 C)     TempSrc: Oral     SpO2: 98% 98%  98%  Weight:   63.3 kg   Height:   5\' 5"  (1.651 m)       Constitutional: Weak and confused Vitals:   07/22/19 2030 07/22/19 2118 07/22/19 2146 07/22/19 2200  BP:  (!) 162/56  (!) 120/49  Pulse: 81 82  77  Resp: (!) 22 (!) 26  19  Temp: (!) 103.1 F (39.5 C)     TempSrc: Oral     SpO2: 98% 98%  98%  Weight:   63.3 kg   Height:   5\' 5"  (1.651 m)    Eyes: PERRL, lids and conjunctivae normal ENMT: Mucous membranes are dry. Posterior pharynx clear of any exudate or lesions.Normal dentition.  Neck: normal, supple, no masses, no thyromegaly Respiratory: clear to auscultation bilaterally, no wheezing, no crackles. Normal respiratory effort. No accessory muscle use.  Cardiovascular: Regular rate and rhythm, no murmurs / rubs / gallops. No extremity edema. 2+ pedal pulses. No carotid bruits.  Abdomen: no tenderness, no masses palpated. No hepatosplenomegaly. Bowel sounds positive.  Musculoskeletal: no clubbing / cyanosis. No  joint deformity upper and lower extremities. Good ROM, no contractures. Normal muscle tone.  Skin: Large scar tissue on the right thigh area laterally, with some's staples in place, mild wound dehiscence with some oozing of secretion no rashes, lesions, ulcers. No induration Neurologic: CN 2-12 grossly intact. Sensation intact, DTR normal. Strength 5/5 in all 4.  Psychiatric: Confused, has dementia..     Labs on Admission: I have personally reviewed following labs and imaging studies  CBC: Recent Labs  Lab 07/22/19 0001 07/22/19 2135  WBC 7.5 15.5*  NEUTROABS 5.0 13.9*  HGB 11.5* 11.3*  HCT 36.7 35.1*  MCV 89.5 88.4  PLT 244 211   Basic Metabolic Panel: Recent Labs  Lab 07/22/19 0001 07/22/19 2135  NA 133* 133*  K 5.1 4.9  CL 98 99  CO2 25 25  GLUCOSE 97 138*  BUN 24* 21  CREATININE 1.46* 1.28*  CALCIUM 9.2 9.1   GFR: Estimated Creatinine Clearance: 25.2 mL/min (A) (by C-G formula based on SCr of 1.28 mg/dL (H)). Liver Function Tests: Recent Labs  Lab 07/22/19 0001 07/22/19 2135  AST 20 19  ALT 11 11  ALKPHOS 88 77  BILITOT 0.5 0.6  PROT 6.4* 6.1*  ALBUMIN 3.3* 3.3*   No results for input(s): LIPASE, AMYLASE in the last 168 hours. No results for input(s): AMMONIA in the last 168 hours. Coagulation Profile: Recent Labs  Lab 07/22/19 2135  INR 1.1   Cardiac Enzymes: No results for input(s): CKTOTAL, CKMB, CKMBINDEX, TROPONINI in the last 168 hours. BNP (last 3 results) No results for input(s): PROBNP in the last 8760 hours. HbA1C: No results for input(s): HGBA1C in the last 72 hours. CBG: No results for input(s): GLUCAP in the last 168 hours. Lipid Profile: No results for input(s): CHOL, HDL, LDLCALC, TRIG, CHOLHDL, LDLDIRECT in the last 72 hours. Thyroid Function Tests: No results for input(s): TSH, T4TOTAL, FREET4, T3FREE, THYROIDAB in the last 72 hours. Anemia Panel: No results for input(s): VITAMINB12, FOLATE, FERRITIN, TIBC, IRON, RETICCTPCT in  the last 72 hours. Urine analysis:    Component Value Date/Time   COLORURINE YELLOW  07/22/2019 2133   APPEARANCEUR CLOUDY (A) 07/22/2019 2133   LABSPEC 1.013 07/22/2019 2133   PHURINE 6.0 07/22/2019 2133   GLUCOSEU NEGATIVE 07/22/2019 2133   HGBUR LARGE (A) 07/22/2019 2133   BILIRUBINUR NEGATIVE 07/22/2019 2133   BILIRUBINUR NEG 01/09/2014 1041   KETONESUR NEGATIVE 07/22/2019 2133   PROTEINUR NEGATIVE 07/22/2019 2133   UROBILINOGEN negative 01/09/2014 1041   UROBILINOGEN 0.2 01/23/2007 0836   NITRITE NEGATIVE 07/22/2019 2133   LEUKOCYTESUR LARGE (A) 07/22/2019 2133   Sepsis Labs: @LABRCNTIP (procalcitonin:4,lacticidven:4) )No results found for this or any previous visit (from the past 240 hour(s)).   Radiological Exams on Admission: CT Head Wo Contrast  Result Date: 07/22/2019 CLINICAL DATA:  Unwitnessed fall, head injury, right scalp laceration, dementia EXAM: CT HEAD WITHOUT CONTRAST CT CERVICAL SPINE WITHOUT CONTRAST TECHNIQUE: Multidetector CT imaging of the head and cervical spine was performed following the standard protocol without intravenous contrast. Multiplanar CT image reconstructions of the cervical spine were also generated. COMPARISON:  CT head dated 06/21/2019 FINDINGS: CT HEAD FINDINGS Brain: No evidence of acute infarction, hemorrhage, hydrocephalus, extra-axial collection or mass lesion/mass effect. Mild cortical atrophy. Subcortical white matter and periventricular small vessel ischemic changes. Vascular: Intracranial atherosclerosis. Skull: Normal. Negative for fracture or focal lesion. Sinuses/Orbits: The visualized paranasal sinuses are essentially clear. The mastoid air cells are unopacified. Other: Soft tissue swelling/laceration overlying the right frontal bone (series 3/image 16). CT CERVICAL SPINE FINDINGS Alignment: Reversal of the normal mid cervical lordosis. Skull base and vertebrae: No acute fracture. No primary bone lesion or focal pathologic process. Soft  tissues and spinal canal: No prevertebral fluid or swelling. No visible canal hematoma. Disc levels: C3-4 ACDF with interbody spacer. Mild to moderate multilevel degenerative changes. Spinal canal is patent. Upper chest: Visualized lung apices are notable for emphysematous changes with right apical pleural-parenchymal scarring. Other: Visualized thyroid is unremarkable. IMPRESSION: Soft tissue swelling/laceration overlying the right frontal bone. No evidence of calvarial fracture. No evidence of acute intracranial abnormality. Atrophy with small vessel ischemic changes. No evidence of traumatic injury to the cervical spine. Mild to moderate degenerative changes. C3-4 ACDF without evidence of complication. Electronically Signed   By: Charline BillsSriyesh  Krishnan M.D.   On: 07/22/2019 21:19   CT Cervical Spine Wo Contrast  Result Date: 07/22/2019 CLINICAL DATA:  Unwitnessed fall, head injury, right scalp laceration, dementia EXAM: CT HEAD WITHOUT CONTRAST CT CERVICAL SPINE WITHOUT CONTRAST TECHNIQUE: Multidetector CT imaging of the head and cervical spine was performed following the standard protocol without intravenous contrast. Multiplanar CT image reconstructions of the cervical spine were also generated. COMPARISON:  CT head dated 06/21/2019 FINDINGS: CT HEAD FINDINGS Brain: No evidence of acute infarction, hemorrhage, hydrocephalus, extra-axial collection or mass lesion/mass effect. Mild cortical atrophy. Subcortical white matter and periventricular small vessel ischemic changes. Vascular: Intracranial atherosclerosis. Skull: Normal. Negative for fracture or focal lesion. Sinuses/Orbits: The visualized paranasal sinuses are essentially clear. The mastoid air cells are unopacified. Other: Soft tissue swelling/laceration overlying the right frontal bone (series 3/image 16). CT CERVICAL SPINE FINDINGS Alignment: Reversal of the normal mid cervical lordosis. Skull base and vertebrae: No acute fracture. No primary bone  lesion or focal pathologic process. Soft tissues and spinal canal: No prevertebral fluid or swelling. No visible canal hematoma. Disc levels: C3-4 ACDF with interbody spacer. Mild to moderate multilevel degenerative changes. Spinal canal is patent. Upper chest: Visualized lung apices are notable for emphysematous changes with right apical pleural-parenchymal scarring. Other: Visualized thyroid is unremarkable. IMPRESSION: Soft tissue swelling/laceration overlying the right  frontal bone. No evidence of calvarial fracture. No evidence of acute intracranial abnormality. Atrophy with small vessel ischemic changes. No evidence of traumatic injury to the cervical spine. Mild to moderate degenerative changes. C3-4 ACDF without evidence of complication. Electronically Signed   By: Julian Hy M.D.   On: 07/22/2019 21:19   DG Chest Port 1 View  Result Date: 07/22/2019 CLINICAL DATA:  Shortness of breath EXAM: PORTABLE CHEST 1 VIEW COMPARISON:  June 26, 2019 FINDINGS: The heart size is stable from prior study. Aortic calcifications are noted. There are small bilateral pleural effusions, improved from prior study. Bibasilar atelectasis is again noted. There is no pneumothorax. There are end-stage degenerative changes of both glenohumeral joints. There is superior subluxation of the right humeral head consistent with a rotator cuff injury. IMPRESSION: 1. No acute cardiopulmonary process. 2. Improving trace to small bilateral pleural effusions with adjacent scarring and atelectasis. Electronically Signed   By: Constance Holster M.D.   On: 07/22/2019 21:17    EKG: Independently reviewed.  It shows sinus rhythm with a rate of 92, no significant ST changes  Assessment/Plan Principal Problem:   Cellulitis of right lower leg Active Problems:   Hyperlipidemia   Essential hypertension   Atrial fibrillation (HCC)   Hypothyroidism   Chronic kidney disease (CKD), stage III (moderate)   Sepsis ([place])     #1  cellulitis of the right hip area: Patient will be admitted and initiated on vancomycin and Rocephin.  Blood cultures obtained.  May require wound cultures as well.  We will continue treatment until resolution of symptoms.  #2 sepsis due to cellulitis: Continue IV antibiotics.  #3 atrial fibrillation: In sinus rhythm now.  Continue to treat  #4 essential hypertension: Continue blood pressure medications.  #5 hypothyroidism: Continue to to replete  #6 chronic kidney disease stage III: Continue monitoring  #7 Alzheimer's dementia: Stable.  Continue monitor  #8 laceration of the scalp: Most likely following a fall.  It has been sutured in the ER.  Continue monitoring on the floor   DVT prophylaxis: Lovenox Code Status: Full code Family Communication: Daughter over the phone Disposition Plan: Back to skilled facility Consults called: None but orthopedics may be consulted Admission status: Inpatient  Severity of Illness: The appropriate patient status for this patient is INPATIENT. Inpatient status is judged to be reasonable and necessary in order to provide the required intensity of service to ensure the patient's safety. The patient's presenting symptoms, physical exam findings, and initial radiographic and laboratory data in the context of their chronic comorbidities is felt to place them at high risk for further clinical deterioration. Furthermore, it is not anticipated that the patient will be medically stable for discharge from the hospital within 2 midnights of admission. The following factors support the patient status of inpatient.   " The patient's presenting symptoms include right hip wound infection. " The worrisome physical exam findings include laceration of the scalp with right hip area redness. " The initial radiographic and laboratory data are worrisome because of evidence of sepsis. " The chronic co-morbidities include dementia.   * I certify that at the point of  admission it is my clinical judgment that the patient will require inpatient hospital care spanning beyond 2 midnights from the point of admission due to high intensity of service, high risk for further deterioration and high frequency of surveillance required.Barbette Merino MD [facility] Pager [phone]  If 7PM-7AM, please contact night-coverage [email or web address] Password [email or web address]  07/23/2019, 12:07 AM

## 2019-07-22 NOTE — ED Notes (Signed)
Report called to [facility], to [employer].

## 2019-07-22 NOTE — ED Triage Notes (Signed)
Patient verbalizes understanding of discharge instructions. Opportunity for questioning and answers were provided. Armband removed by staff, pt discharged from ED. Pt. ambulatory and discharged home.  

## 2019-07-22 NOTE — ED Triage Notes (Signed)
Pt here from [facility] for rehab following a femur R fracture.  Unwitnessed fall while trying to get out of WC, struck head on side table.  Hx of dementia and reported at her baseline.  Laceration noted to R side scalp and R shoulder pain.  No hx of blood thinners.

## 2019-07-22 NOTE — ED Notes (Signed)
Pt to CT

## 2019-07-22 NOTE — ED Provider Notes (Signed)
LACERATION REPAIR Performed by: Antony Madura Authorized by: Antony Madura Consent: Verbal consent obtained. Risks and benefits: risks, benefits and alternatives were discussed Consent given by: patient Patient identity confirmed: provided demographic data Prepped and Draped in normal sterile fashion Wound explored  Laceration Location: R forehead  Laceration Length: 3cm  No Foreign Bodies seen or palpated  Anesthesia: local infiltration  Local anesthetic: lidocaine 2% with epinephrine  Anesthetic total: 5 ml  Irrigation method: syringe Amount of cleaning: standard  Skin closure: 5-0 prolene  Number of sutures: 3  Technique: simple interrupted  Patient tolerance: Patient tolerated the procedure well with no immediate complications.    Antony Madura, PA-C 07/22/19 2313    Derwood Kaplan, MD 07/23/19 2112

## 2019-07-23 ENCOUNTER — Other Ambulatory Visit: Payer: Self-pay

## 2019-07-23 ENCOUNTER — Encounter (HOSPITAL_COMMUNITY): Payer: Self-pay | Admitting: Internal Medicine

## 2019-07-23 DIAGNOSIS — Z87891 Personal history of nicotine dependence: Secondary | ICD-10-CM | POA: Diagnosis not present

## 2019-07-23 DIAGNOSIS — L03115 Cellulitis of right lower limb: Secondary | ICD-10-CM | POA: Diagnosis present

## 2019-07-23 DIAGNOSIS — F0280 Dementia in other diseases classified elsewhere without behavioral disturbance: Secondary | ICD-10-CM | POA: Diagnosis present

## 2019-07-23 DIAGNOSIS — E039 Hypothyroidism, unspecified: Secondary | ICD-10-CM | POA: Diagnosis present

## 2019-07-23 DIAGNOSIS — N1831 Chronic kidney disease, stage 3a: Secondary | ICD-10-CM | POA: Diagnosis present

## 2019-07-23 DIAGNOSIS — G309 Alzheimer's disease, unspecified: Secondary | ICD-10-CM | POA: Diagnosis present

## 2019-07-23 DIAGNOSIS — Z96651 Presence of right artificial knee joint: Secondary | ICD-10-CM | POA: Diagnosis present

## 2019-07-23 DIAGNOSIS — Z20822 Contact with and (suspected) exposure to covid-19: Secondary | ICD-10-CM | POA: Diagnosis present

## 2019-07-23 DIAGNOSIS — I129 Hypertensive chronic kidney disease with stage 1 through stage 4 chronic kidney disease, or unspecified chronic kidney disease: Secondary | ICD-10-CM | POA: Diagnosis present

## 2019-07-23 DIAGNOSIS — Z888 Allergy status to other drugs, medicaments and biological substances status: Secondary | ICD-10-CM | POA: Diagnosis not present

## 2019-07-23 DIAGNOSIS — N390 Urinary tract infection, site not specified: Secondary | ICD-10-CM | POA: Diagnosis present

## 2019-07-23 DIAGNOSIS — Z79899 Other long term (current) drug therapy: Secondary | ICD-10-CM | POA: Diagnosis not present

## 2019-07-23 DIAGNOSIS — Z79890 Hormone replacement therapy (postmenopausal): Secondary | ICD-10-CM | POA: Diagnosis not present

## 2019-07-23 DIAGNOSIS — K219 Gastro-esophageal reflux disease without esophagitis: Secondary | ICD-10-CM | POA: Diagnosis present

## 2019-07-23 DIAGNOSIS — Z96612 Presence of left artificial shoulder joint: Secondary | ICD-10-CM | POA: Diagnosis present

## 2019-07-23 DIAGNOSIS — Z8249 Family history of ischemic heart disease and other diseases of the circulatory system: Secondary | ICD-10-CM | POA: Diagnosis not present

## 2019-07-23 DIAGNOSIS — I480 Paroxysmal atrial fibrillation: Secondary | ICD-10-CM | POA: Diagnosis present

## 2019-07-23 DIAGNOSIS — E785 Hyperlipidemia, unspecified: Secondary | ICD-10-CM | POA: Diagnosis present

## 2019-07-23 DIAGNOSIS — T8141XA Infection following a procedure, superficial incisional surgical site, initial encounter: Secondary | ICD-10-CM | POA: Diagnosis present

## 2019-07-23 DIAGNOSIS — Y92129 Unspecified place in nursing home as the place of occurrence of the external cause: Secondary | ICD-10-CM | POA: Diagnosis not present

## 2019-07-23 DIAGNOSIS — I700 Atherosclerosis of aorta: Secondary | ICD-10-CM | POA: Diagnosis present

## 2019-07-23 DIAGNOSIS — Z7951 Long term (current) use of inhaled steroids: Secondary | ICD-10-CM | POA: Diagnosis not present

## 2019-07-23 DIAGNOSIS — S0181XA Laceration without foreign body of other part of head, initial encounter: Secondary | ICD-10-CM | POA: Diagnosis present

## 2019-07-23 DIAGNOSIS — J449 Chronic obstructive pulmonary disease, unspecified: Secondary | ICD-10-CM | POA: Diagnosis present

## 2019-07-23 DIAGNOSIS — G4700 Insomnia, unspecified: Secondary | ICD-10-CM | POA: Diagnosis present

## 2019-07-23 DIAGNOSIS — W050XXA Fall from non-moving wheelchair, initial encounter: Secondary | ICD-10-CM | POA: Diagnosis present

## 2019-07-23 DIAGNOSIS — Z86010 Personal history of colonic polyps: Secondary | ICD-10-CM | POA: Diagnosis not present

## 2019-07-23 LAB — URINE CULTURE

## 2019-07-23 LAB — MRSA PCR SCREENING: MRSA by PCR: NEGATIVE

## 2019-07-23 LAB — SARS CORONAVIRUS 2 (TAT 6-24 HRS): SARS Coronavirus 2: NEGATIVE

## 2019-07-23 LAB — LACTIC ACID, PLASMA: Lactic Acid, Venous: 1.2 mmol/L (ref 0.5–1.9)

## 2019-07-23 MED ORDER — AMIODARONE HCL 200 MG PO TABS
200.0000 mg | ORAL_TABLET | Freq: Every morning | ORAL | Status: DC
  Administered 2019-07-23 – 2019-07-24 (×2): 200 mg via ORAL
  Filled 2019-07-23 (×2): qty 1

## 2019-07-23 MED ORDER — BUSPIRONE HCL 5 MG PO TABS
5.0000 mg | ORAL_TABLET | Freq: Every day | ORAL | Status: DC
  Administered 2019-07-23 – 2019-07-24 (×2): 5 mg via ORAL
  Filled 2019-07-23 (×2): qty 1

## 2019-07-23 MED ORDER — ASPIRIN EC 325 MG PO TBEC
325.0000 mg | DELAYED_RELEASE_TABLET | Freq: Every day | ORAL | Status: DC
  Administered 2019-07-23 – 2019-07-24 (×2): 325 mg via ORAL
  Filled 2019-07-23 (×2): qty 1

## 2019-07-23 MED ORDER — SENNA 8.6 MG PO TABS
2.0000 | ORAL_TABLET | Freq: Two times a day (BID) | ORAL | Status: DC
  Administered 2019-07-23 – 2019-07-24 (×3): 17.2 mg via ORAL
  Filled 2019-07-23 (×4): qty 2

## 2019-07-23 MED ORDER — LOSARTAN POTASSIUM 50 MG PO TABS
100.0000 mg | ORAL_TABLET | Freq: Every morning | ORAL | Status: DC
  Filled 2019-07-23 (×2): qty 2

## 2019-07-23 MED ORDER — ONDANSETRON HCL 4 MG PO TABS: 4.0000 mg | ORAL_TABLET | Freq: Four times a day (QID) | ORAL | Status: DC | PRN

## 2019-07-23 MED ORDER — DONEPEZIL HCL 10 MG PO TABS
10.0000 mg | ORAL_TABLET | Freq: Every day | ORAL | Status: DC
  Administered 2019-07-23 – 2019-07-24 (×2): 10 mg via ORAL
  Filled 2019-07-23 (×2): qty 1

## 2019-07-23 MED ORDER — VANCOMYCIN HCL IN DEXTROSE 1-5 GM/200ML-% IV SOLN: 1000.0000 mg | INTRAVENOUS | Status: DC

## 2019-07-23 MED ORDER — ACETAMINOPHEN 325 MG PO TABS: 650.0000 mg | ORAL_TABLET | Freq: Four times a day (QID) | ORAL | Status: DC | PRN

## 2019-07-23 MED ORDER — ACETAMINOPHEN 650 MG RE SUPP: 650.0000 mg | Freq: Four times a day (QID) | RECTAL | Status: DC | PRN

## 2019-07-23 MED ORDER — METOPROLOL SUCCINATE ER 25 MG PO TB24
25.0000 mg | ORAL_TABLET | Freq: Every morning | ORAL | Status: DC
  Administered 2019-07-23 – 2019-07-24 (×2): 25 mg via ORAL
  Filled 2019-07-23 (×2): qty 1

## 2019-07-23 MED ORDER — LEVOTHYROXINE SODIUM 50 MCG PO TABS
50.0000 ug | ORAL_TABLET | Freq: Every day | ORAL | Status: DC
  Administered 2019-07-23 – 2019-07-24 (×2): 50 ug via ORAL
  Filled 2019-07-23 (×2): qty 1

## 2019-07-23 MED ORDER — ARIPIPRAZOLE 5 MG PO TABS
5.0000 mg | ORAL_TABLET | Freq: Every day | ORAL | Status: DC
  Administered 2019-07-23 – 2019-07-24 (×2): 5 mg via ORAL
  Filled 2019-07-23 (×2): qty 1

## 2019-07-23 MED ORDER — ALBUTEROL SULFATE (2.5 MG/3ML) 0.083% IN NEBU: 3.0000 mL | INHALATION_SOLUTION | RESPIRATORY_TRACT | Status: DC | PRN

## 2019-07-23 MED ORDER — ONDANSETRON HCL 4 MG/2ML IJ SOLN: 4.0000 mg | Freq: Four times a day (QID) | INTRAMUSCULAR | Status: DC | PRN

## 2019-07-23 MED ORDER — ENOXAPARIN SODIUM 30 MG/0.3ML ~~LOC~~ SOLN
30.0000 mg | SUBCUTANEOUS | Status: DC
  Administered 2019-07-23 – 2019-07-24 (×2): 30 mg via SUBCUTANEOUS
  Filled 2019-07-23 (×2): qty 0.3

## 2019-07-23 MED ORDER — OXYCODONE HCL 5 MG PO TABS
5.0000 mg | ORAL_TABLET | Freq: Four times a day (QID) | ORAL | Status: DC | PRN
  Administered 2019-07-23: 5 mg via ORAL
  Filled 2019-07-23: qty 1

## 2019-07-23 MED ORDER — FUROSEMIDE 20 MG PO TABS
20.0000 mg | ORAL_TABLET | Freq: Every morning | ORAL | Status: DC
  Administered 2019-07-23 – 2019-07-24 (×2): 20 mg via ORAL
  Filled 2019-07-23 (×2): qty 1

## 2019-07-23 MED ORDER — MEMANTINE HCL 10 MG PO TABS
10.0000 mg | ORAL_TABLET | Freq: Two times a day (BID) | ORAL | Status: DC
  Administered 2019-07-23 – 2019-07-24 (×4): 10 mg via ORAL
  Filled 2019-07-23 (×4): qty 1

## 2019-07-23 MED ORDER — MOMETASONE FURO-FORMOTEROL FUM 200-5 MCG/ACT IN AERO
2.0000 | INHALATION_SPRAY | Freq: Two times a day (BID) | RESPIRATORY_TRACT | Status: DC
  Administered 2019-07-23 – 2019-07-24 (×3): 2 via RESPIRATORY_TRACT
  Filled 2019-07-23: qty 8.8

## 2019-07-23 MED ORDER — SODIUM CHLORIDE 0.9 % IV SOLN: INTRAVENOUS | Status: DC

## 2019-07-23 MED ORDER — SODIUM CHLORIDE 0.9 % IV SOLN: 1.0000 g | INTRAVENOUS | Status: DC

## 2019-07-23 MED ORDER — OMEPRAZOLE MAGNESIUM 20 MG PO TBEC: 20.0000 mg | DELAYED_RELEASE_TABLET | Freq: Every morning | ORAL | Status: DC

## 2019-07-23 MED ORDER — POTASSIUM CHLORIDE CRYS ER 10 MEQ PO TBCR
10.0000 meq | EXTENDED_RELEASE_TABLET | Freq: Every day | ORAL | Status: DC
  Administered 2019-07-23 – 2019-07-24 (×2): 10 meq via ORAL
  Filled 2019-07-23 (×2): qty 1

## 2019-07-23 NOTE — Progress Notes (Signed)
Pharmacy Antibiotic Note  [name] is a 86 y.o. female admitted on 07/22/2019 with wound infection.  Pharmacy has been consulted for Vancomycin dosing. WBC mildly elevated. Mild renal dysfunction. Pt with possible infection to recent orthopedic surgery incision.   Plan: Vancomycin 1000 mg IV q48h >>Estimated AUC: 435 Ceftriaxone per MD Trend WBC, temp, renal function  F/U infectious work-up Drug levels as indicated   Height: 5\' 5"  (165.1 cm) Weight: 139 lb 8.8 oz (63.3 kg) IBW/kg (Calculated) : 57  Temp (24hrs), Avg:102.7 F (39.3 C), Min:102.2 F (39 C), Max:103.1 F (39.5 C)  Recent Labs  Lab 07/22/19 0001 07/22/19 2133 07/22/19 2135  WBC 7.5  --  15.5*  CREATININE 1.46*  --  1.28*  LATICACIDVEN  --  1.3  --     Estimated Creatinine Clearance: 25.2 mL/min (A) (by C-G formula based on SCr of 1.28 mg/dL (H)).    Allergies  Allergen Reactions  . Ace Inhibitors Cough    "Allergic," per MAR  . Simvastatin Other (See Comments)    Leg pain- "Allergic," per [facility]    [facility], PharmD, BCPS Clinical Pharmacist Phone: [phone]

## 2019-07-23 NOTE — Progress Notes (Signed)
PROGRESS NOTE    [name]  JOI:[id number] DOB: October 24, 1924 DOA: 07/22/2019 PCP: Raliegh Ip, DO      Brief Narrative:  [name] is a 86 y.o. F with dementia, lives in ALF, HTN who presented with right leg swelling and pain near her surgery site.  In the ER, patient found to have temp 103F, WBC 15.5 and was confused.  CT head and c-spine unremarkable.  Urinalysis suggested infection.         Assessment & Plan:  Superficial surgical site wound infection Possible UTI Sepsis ruled out Patient's dementia precludes history taking to determine the likely source of infection.  The knee is certainly red, there may be a superficial infection. -Continue ceftriaxone for now -Follow urine culture  Dementia -Continue Abilify, BuSpar, donepezil, memantine  COPD -Continue inhalers  Paroxysmal atrial fibrillation Not on anticoagulants, unclear reason, suspect due to fragility. -Continue amiodarone, metoprolol  Hypertension Blood pressure soft -Continue metoprolol, losartan, furosemide  Hypothyroidism -Continue levothyroxine  CKD stage IIIa Creatinine stable        Disposition: The patient was admitted with possible superficial surgical infection versus UTI.   I will discharge when her urine culture has returned and we are able to narrow to oral antibiotics.        MDM: The below labs and imaging reports were reviewed and summarized above.  Medication management as above.   DVT prophylaxis: SCDs Code Status: Full code Family Communication:     Consultants:     Procedures:     Antimicrobials:   Ceftriaxone 3/28>>  Vancomycin 3/28>> 3/29  Culture data:   3/28 urine culture pending  3/28 blood culture pending          Subjective: The patient has no complaints.  Nursing note no respiratory distress, vomiting, abdominal pain, change in mentation overnight.  Patient denies headache, neck pain, chest pain, abdominal  pain.  Objective: Vitals:   07/23/19 0744 07/23/19 1505 07/23/19 1954 07/23/19 2007  BP: (!) 108/56 105/60  104/81  Pulse: 70 72  70  Resp: 14 16  17   Temp: 99.2 F (37.3 C) 98.1 F (36.7 C)  99 F (37.2 C)  TempSrc: Oral   Oral  SpO2: 98% 95% 96% 95%  Weight:      Height:        Intake/Output Summary (Last 24 hours) at 07/23/2019 2013 Last data filed at 07/23/2019 1507 Gross per 24 hour  Intake 500.16 ml  Output 500 ml  Net 0.16 ml   Filed Weights   07/22/19 1854 07/22/19 2146  Weight: (!) 0.454 kg 63.3 kg    Examination: General appearance: Thin elderly adult female, alert and in no obvious distress.  Appears demented. HEENT: Anicteric, conjunctiva pink, lids and lashes normal. No nasal deformity, discharge, epistaxis.  Lips dry, oropharynx tacky dry, hearing diminished.   Skin: Warm and dry.  No jaundice.  No suspicious rashes or lesions. Cardiac: RRR, nl S1-S2, no murmurs appreciated.  Capillary refill is brisk.  JVP normal.  No LE edema.  Radial  pulses 2+ and symmetric. Respiratory: Normal respiratory rate and rhythm.  CTAB without rales or wheezes. Abdomen: Abdomen soft.  No TTP or guarding. No ascites, distension, hepatosplenomegaly.   MSK: No deformities or effusions. Neuro: Awake and alert.  EOMI, moves all extremities with generalized weakness. Speech fluent.    Psych: Sensorium intact and responding to questions, attention normal. Affect blunted.  Judgment and insight appear to really impaired by dementia.  Data Reviewed: I have personally reviewed following labs and imaging studies:  CBC: Recent Labs  Lab 07/22/19 0001 07/22/19 2135  WBC 7.5 15.5*  NEUTROABS 5.0 13.9*  HGB 11.5* 11.3*  HCT 36.7 35.1*  MCV 89.5 88.4  PLT 244 144   Basic Metabolic Panel: Recent Labs  Lab 07/22/19 0001 07/22/19 2135  NA 133* 133*  K 5.1 4.9  CL 98 99  CO2 25 25  GLUCOSE 97 138*  BUN 24* 21  CREATININE 1.46* 1.28*  CALCIUM 9.2 9.1   GFR: Estimated  Creatinine Clearance: 25.2 mL/min (A) (by C-G formula based on SCr of 1.28 mg/dL (H)). Liver Function Tests: Recent Labs  Lab 07/22/19 0001 07/22/19 2135  AST 20 19  ALT 11 11  ALKPHOS 88 77  BILITOT 0.5 0.6  PROT 6.4* 6.1*  ALBUMIN 3.3* 3.3*   No results for input(s): LIPASE, AMYLASE in the last 168 hours. No results for input(s): AMMONIA in the last 168 hours. Coagulation Profile: Recent Labs  Lab 07/22/19 2135  INR 1.1   Cardiac Enzymes: No results for input(s): CKTOTAL, CKMB, CKMBINDEX, TROPONINI in the last 168 hours. BNP (last 3 results) No results for input(s): PROBNP in the last 8760 hours. HbA1C: No results for input(s): HGBA1C in the last 72 hours. CBG: No results for input(s): GLUCAP in the last 168 hours. Lipid Profile: No results for input(s): CHOL, HDL, LDLCALC, TRIG, CHOLHDL, LDLDIRECT in the last 72 hours. Thyroid Function Tests: No results for input(s): TSH, T4TOTAL, FREET4, T3FREE, THYROIDAB in the last 72 hours. Anemia Panel: No results for input(s): VITAMINB12, FOLATE, FERRITIN, TIBC, IRON, RETICCTPCT in the last 72 hours. Urine analysis:    Component Value Date/Time   COLORURINE YELLOW 07/22/2019 2133   APPEARANCEUR CLOUDY (A) 07/22/2019 2133   LABSPEC 1.013 07/22/2019 2133   PHURINE 6.0 07/22/2019 2133   GLUCOSEU NEGATIVE 07/22/2019 2133   HGBUR LARGE (A) 07/22/2019 2133   BILIRUBINUR NEGATIVE 07/22/2019 2133   BILIRUBINUR NEG 01/09/2014 1041   KETONESUR NEGATIVE 07/22/2019 2133   PROTEINUR NEGATIVE 07/22/2019 2133   UROBILINOGEN negative 01/09/2014 1041   UROBILINOGEN 0.2 01/23/2007 0836   NITRITE NEGATIVE 07/22/2019 2133   LEUKOCYTESUR LARGE (A) 07/22/2019 2133   Sepsis Labs: @LABRCNTIP (procalcitonin:4,lacticacidven:4)  ) Recent Results (from the past 240 hour(s))  Blood Culture (routine x 2)     Status: None (Preliminary result)   Collection Time: 07/22/19  9:33 PM   Specimen: BLOOD LEFT ARM  Result Value Ref Range Status    Specimen Description BLOOD LEFT ARM  Final   Special Requests   Final    BOTTLES DRAWN AEROBIC AND ANAEROBIC Blood Culture results may not be optimal due to an excessive volume of blood received in culture bottles   Culture NO GROWTH < 12 HOURS  Final   Report Status PENDING  Incomplete  Blood Culture (routine x 2)     Status: None (Preliminary result)   Collection Time: 07/22/19  9:35 PM   Specimen: BLOOD  Result Value Ref Range Status   Specimen Description BLOOD RIGHT ANTECUBITAL  Final   Special Requests   Final    BOTTLES DRAWN AEROBIC AND ANAEROBIC Blood Culture adequate volume   Culture NO GROWTH < 12 HOURS  Final   Report Status PENDING  Incomplete  SARS CORONAVIRUS 2 (TAT 6-24 HRS) Nasopharyngeal Nasopharyngeal Swab     Status: None   Collection Time: 07/23/19  1:40 AM   Specimen: Nasopharyngeal Swab  Result Value Ref Range Status  SARS Coronavirus 2 NEGATIVE NEGATIVE Final    Comment: (NOTE) SARS-CoV-2 target nucleic acids are NOT DETECTED. The SARS-CoV-2 RNA is generally detectable in upper and lower respiratory specimens during the acute phase of infection. Negative results do not preclude SARS-CoV-2 infection, do not rule out co-infections with other pathogens, and should not be used as the sole basis for treatment or other patient management decisions. Negative results must be combined with clinical observations, patient history, and epidemiological information. The expected result is Negative. Fact Sheet for Patients: HairSlick.no Fact Sheet for Healthcare Providers: quierodirigir.com This test is not yet approved or cleared by the [place] FDA and  has been authorized for detection and/or diagnosis of SARS-CoV-2 by FDA under an Emergency Use Authorization (EUA). This EUA will remain  in effect (meaning this test can be used) for the duration of the COVID-19 declaration under Section 56 4(b)(1) of the Act,  21 U.S.C. section 360bbb-3(b)(1), unless the authorization is terminated or revoked sooner. Performed at [facility], [address] [place], [place] [address]   MRSA PCR Screening     Status: None   Collection Time: 07/23/19 11:22 AM   Specimen: Nasopharyngeal  Result Value Ref Range Status   MRSA by PCR NEGATIVE NEGATIVE Final    Comment:        The GeneXpert MRSA Assay (FDA approved for NASAL specimens only), is one component of a comprehensive MRSA colonization surveillance program. It is not intended to diagnose MRSA infection nor to guide or monitor treatment for MRSA infections. Performed at [facility], [address] [place], [place] [address]          Radiology Studies: CT Head Wo Contrast  Result Date: 07/22/2019 CLINICAL DATA:  Unwitnessed fall, head injury, right scalp laceration, dementia EXAM: CT HEAD WITHOUT CONTRAST CT CERVICAL SPINE WITHOUT CONTRAST TECHNIQUE: Multidetector CT imaging of the head and cervical spine was performed following the standard protocol without intravenous contrast. Multiplanar CT image reconstructions of the cervical spine were also generated. COMPARISON:  CT head dated 06/21/2019 FINDINGS: CT HEAD FINDINGS Brain: No evidence of acute infarction, hemorrhage, hydrocephalus, extra-axial collection or mass lesion/mass effect. Mild cortical atrophy. Subcortical white matter and periventricular small vessel ischemic changes. Vascular: Intracranial atherosclerosis. Skull: Normal. Negative for fracture or focal lesion. Sinuses/Orbits: The visualized paranasal sinuses are essentially clear. The mastoid air cells are unopacified. Other: Soft tissue swelling/laceration overlying the right frontal bone (series 3/image 16). CT CERVICAL SPINE FINDINGS Alignment: Reversal of the normal mid cervical lordosis. Skull base and vertebrae: No acute fracture. No primary bone lesion or focal pathologic process. Soft tissues and spinal canal: No  prevertebral fluid or swelling. No visible canal hematoma. Disc levels: C3-4 ACDF with interbody spacer. Mild to moderate multilevel degenerative changes. Spinal canal is patent. Upper chest: Visualized lung apices are notable for emphysematous changes with right apical pleural-parenchymal scarring. Other: Visualized thyroid is unremarkable. IMPRESSION: Soft tissue swelling/laceration overlying the right frontal bone. No evidence of calvarial fracture. No evidence of acute intracranial abnormality. Atrophy with small vessel ischemic changes. No evidence of traumatic injury to the cervical spine. Mild to moderate degenerative changes. C3-4 ACDF without evidence of complication. Electronically Signed   By: Charline Bills M.D.   On: 07/22/2019 21:19   CT Cervical Spine Wo Contrast  Result Date: 07/22/2019 CLINICAL DATA:  Unwitnessed fall, head injury, right scalp laceration, dementia EXAM: CT HEAD WITHOUT CONTRAST CT CERVICAL SPINE WITHOUT CONTRAST TECHNIQUE: Multidetector CT imaging of the head and  cervical spine was performed following the standard protocol without intravenous contrast. Multiplanar CT image reconstructions of the cervical spine were also generated. COMPARISON:  CT head dated 06/21/2019 FINDINGS: CT HEAD FINDINGS Brain: No evidence of acute infarction, hemorrhage, hydrocephalus, extra-axial collection or mass lesion/mass effect. Mild cortical atrophy. Subcortical white matter and periventricular small vessel ischemic changes. Vascular: Intracranial atherosclerosis. Skull: Normal. Negative for fracture or focal lesion. Sinuses/Orbits: The visualized paranasal sinuses are essentially clear. The mastoid air cells are unopacified. Other: Soft tissue swelling/laceration overlying the right frontal bone (series 3/image 16). CT CERVICAL SPINE FINDINGS Alignment: Reversal of the normal mid cervical lordosis. Skull base and vertebrae: No acute fracture. No primary bone lesion or focal pathologic  process. Soft tissues and spinal canal: No prevertebral fluid or swelling. No visible canal hematoma. Disc levels: C3-4 ACDF with interbody spacer. Mild to moderate multilevel degenerative changes. Spinal canal is patent. Upper chest: Visualized lung apices are notable for emphysematous changes with right apical pleural-parenchymal scarring. Other: Visualized thyroid is unremarkable. IMPRESSION: Soft tissue swelling/laceration overlying the right frontal bone. No evidence of calvarial fracture. No evidence of acute intracranial abnormality. Atrophy with small vessel ischemic changes. No evidence of traumatic injury to the cervical spine. Mild to moderate degenerative changes. C3-4 ACDF without evidence of complication. Electronically Signed   By: Charline Bills M.D.   On: 07/22/2019 21:19   DG Chest Port 1 View  Result Date: 07/22/2019 CLINICAL DATA:  Shortness of breath EXAM: PORTABLE CHEST 1 VIEW COMPARISON:  June 26, 2019 FINDINGS: The heart size is stable from prior study. Aortic calcifications are noted. There are small bilateral pleural effusions, improved from prior study. Bibasilar atelectasis is again noted. There is no pneumothorax. There are end-stage degenerative changes of both glenohumeral joints. There is superior subluxation of the right humeral head consistent with a rotator cuff injury. IMPRESSION: 1. No acute cardiopulmonary process. 2. Improving trace to small bilateral pleural effusions with adjacent scarring and atelectasis. Electronically Signed   By: Katherine Mantle M.D.   On: 07/22/2019 21:17        Scheduled Meds:  amiodarone  200 mg Oral q AM   ARIPiprazole  5 mg Oral Daily   aspirin EC  325 mg Oral Daily   busPIRone  5 mg Oral QHS   donepezil  10 mg Oral QHS   enoxaparin (LOVENOX) injection  30 mg Subcutaneous Q24H   furosemide  20 mg Oral q AM   levothyroxine  50 mcg Oral Q0600   losartan  100 mg Oral q AM   memantine  10 mg Oral BID   metoprolol  succinate  25 mg Oral q AM   mometasone-formoterol  2 puff Inhalation BID   potassium chloride SA  10 mEq Oral Daily   senna  2 tablet Oral BID   Continuous Infusions:  sodium chloride 75 mL/hr at 07/23/19 1507   cefTRIAXone (ROCEPHIN)  IV Stopped (07/22/19 2215)     LOS: 0 days    Time spent: 25 minutes    Alberteen Sam, MD [facility] 07/23/2019, 8:13 PM     Please page though AMION or Epic secure chat:  For [employer], Higher education careers adviser

## 2019-07-23 NOTE — Plan of Care (Signed)
  Problem: Education: Goal: Knowledge of General Education information will improve Description: Including pain rating scale, medication(s)/side effects and non-pharmacologic comfort measures Outcome: Progressing   Problem: Health Behavior/Discharge Planning: Goal: Ability to manage health-related needs will improve Outcome: Progressing   Problem: Clinical Measurements: Goal: Will remain free from infection Outcome: Progressing Goal: Cardiovascular complication will be avoided Outcome: Progressing   Problem: Nutrition: Goal: Adequate nutrition will be maintained Outcome: Progressing   Problem: Pain Managment: Goal: General experience of comfort will improve Outcome: Progressing   Problem: Safety: Goal: Ability to remain free from injury will improve Outcome: Progressing   Problem: Skin Integrity: Goal: Risk for impaired skin integrity will decrease Outcome: Progressing   

## 2019-07-23 NOTE — ED Notes (Signed)
Blood cultures take 10 minutes before antibiotics given. Just clicked off late.

## 2019-07-23 NOTE — Evaluation (Signed)
Occupational Therapy Evaluation Patient Details Name: [name] MRN: [id number] DOB: 03-20-1925 Today's Date: 07/23/2019    History of Present Illness [name] is a 86 y.o. female with medical history significant of hypertension, osteoarthritis, gastritis, Alzheimer's dementia diverticulitis, who arrived to ED from [facility] following unwitnessed fall while attempting to get out of WC. [name] on side table. Sabrina with R leg wound infection.    Clinical Impression   Sabrina presents from [facility]. Sabrina has dementia at baseline, unsure Sabrina's reliability of prior level of information provided. Sabrina reports she required assistance for dressing. Per chart, Sabrina was using w/c for mobility. Sabrina currently requires minA for bed mobility, maxA+2 for stand-pivot transfers and modA for sit<>stand transfer. She required totalA for pericare while standing with modA for support. Sabrina reports she does not have an appetite, had majority of her lunch untouched on tray, RN notified. Currently recommend return to SNF and continued occupational therapy services to maximize Sabrina's independence with ADL and functional mobility. Will continue to follow acutely.     Follow Up Recommendations  SNF;Supervision/Assistance - 24 hour    Equipment Recommendations  3 in 1 bedside commode    Recommendations for Other Services       Precautions / Restrictions Precautions Precautions: Fall Required Braces or Orthoses: Knee Immobilizer - Right Restrictions Weight Bearing Restrictions: Yes Other Position/Activity Restrictions: NWB per Placido Sou, PA-C note from 3/27       Mobility Bed Mobility Overal bed mobility: Needs Assistance Bed Mobility: Supine to Sit     Supine to sit: Min assist;HOB elevated     General bed mobility comments: minA to progress hips to EOB  Transfers Overall transfer level: Needs assistance Equipment used: 2 person hand held assist;1 person hand held  assist Transfers: Sit to/from [employer] Sit to Stand: Mod assist;+2 safety/equipment Stand pivot transfers: Max assist;+2 physical assistance;+2 safety/equipment       General transfer comment: maxA+2 for stand-pivot with therapist blocking RLE to maintain NWB status;Sabrina required modA face to face sit<>stand     Balance Overall balance assessment: Needs assistance Sitting-balance support: No upper extremity supported;Feet supported Sitting balance-Leahy Scale: Fair Sitting balance - Comments: able to tolerate static sitting balance   Standing balance support: Bilateral upper extremity supported Standing balance-Leahy Scale: Poor Standing balance comment: modA face to face support for balance in standing                           ADL either performed or assessed with clinical judgement   ADL Overall ADL's : Needs assistance/impaired Eating/Feeding: Set up;Sitting Eating/Feeding Details (indicate cue type and reason): Sabrina reports she does not have an appetite, Sabrina's lunch sitting in front of her, appears to have picked at meal but still with 98% of meal remaining Grooming: Set up;Sitting   Upper Body Bathing: Minimal assistance;Sitting   Lower Body Bathing: Moderate assistance;Sit to/from stand   Upper Body Dressing : Minimal assistance;Sitting   Lower Body Dressing: Moderate assistance;Sit to/from stand   Toilet Transfer: Maximal assistance;+2 for physical assistance;+2 for safety/equipment;Stand-pivot Toilet Transfer Details (indicate cue type and reason): maxA stand pivot from EOB to [facility] Toileting- Clothing Manipulation and Hygiene: Total assistance Toileting - Clothing Manipulation Details (indicate cue type and reason): Sabrina required totalA  for pericare     Functional mobility during ADLs: Moderate assistance General ADL Comments: Sabrina limited by cognition, NWB through RLE and decreased activity tolerance  Vision         Perception      Praxis      Pertinent Vitals/Pain Pain Assessment: Faces Faces Pain Scale: Hurts little more Pain Location: generalized, Sabrina unable to specify Pain Descriptors / Indicators: Grimacing Pain Intervention(s): Limited activity within patient's tolerance;Monitored during session;Repositioned     Hand Dominance Right   Extremity/Trunk Assessment Upper Extremity Assessment Upper Extremity Assessment: Generalized weakness   Lower Extremity Assessment Lower Extremity Assessment: Defer to Sabrina evaluation;RLE deficits/detail RLE Deficits / Details: NWB, Sabrina in knee immobilizer RLE: Unable to fully assess due to immobilization   Cervical / Trunk Assessment Cervical / Trunk Assessment: Kyphotic   Communication Communication Communication: HOH   Cognition Arousal/Alertness: Awake/alert Behavior During Therapy: WFL for tasks assessed/performed Overall Cognitive Status: History of cognitive impairments - at baseline                                 General Comments: anticipate Sabrina is at baseline, oriented to name and birthdate   General Comments  vss    Exercises     Shoulder Instructions      Home Living Family/patient expects to be discharged to:: Skilled nursing facility                                 Additional Comments: Sabrina resident at SNF       Prior Functioning/Environment Level of Independence: Needs assistance  Gait / Transfers Assistance Needed: Sabrina was using WC, had a fall attempting to get out of WC ADL's / Homemaking Assistance Needed: Sabrina reports having assistance for dressing, Sabrina is a poor historian, anticipate she required assistance for all ADL            OT Problem List: Decreased strength;Decreased activity tolerance;Impaired balance (sitting and/or standing);Decreased safety awareness;Decreased knowledge of use of DME or AE;Decreased cognition;Pain      OT Treatment/Interventions: Self-care/ADL training;Therapeutic exercise;DME  and/or AE instruction;Therapeutic activities;Cognitive remediation/compensation;Patient/family education;Balance training    OT Goals(Current goals can be found in the care plan section) Acute Rehab OT Goals Patient Stated Goal: to feel better OT Goal Formulation: With patient Time For Goal Achievement: 08/06/19 Potential to Achieve Goals: Good ADL Goals Sabrina Will Perform Grooming: with set-up;sitting;standing Sabrina Will Transfer to Toilet: with min guard assist;stand pivot transfer Additional ADL Goal #1: Sabrina will progress to EOB with supervision in preparation for ADL.  OT Frequency: Min 2X/week   Barriers to D/C:            Co-evaluation Sabrina/OT/SLP Co-Evaluation/Treatment: Yes Reason for Co-Treatment: Complexity of the patient's impairments (multi-system involvement);For patient/therapist safety;To address functional/ADL transfers   OT goals addressed during session: ADL's and self-care      AM-PAC OT "6 Clicks" Daily Activity     Outcome Measure Help from another person eating meals?: A Little Help from another person taking care of personal grooming?: A Little Help from another person toileting, which includes using toliet, bedpan, or urinal?: A Lot Help from another person bathing (including washing, rinsing, drying)?: A Lot Help from another person to put on and taking off regular upper body clothing?: A Little Help from another person to put on and taking off regular lower body clothing?: A Lot 6 Click Score: 15   End of Session Equipment Utilized During Treatment: Gait belt;Right knee immobilizer Nurse Communication: Mobility status  Activity Tolerance:  Patient tolerated treatment well Patient left: in chair;with call bell/phone within reach;with chair alarm set  OT Visit Diagnosis: Unsteadiness on feet (R26.81);Other abnormalities of gait and mobility (R26.89);History of falling (Z91.81);Muscle weakness (generalized) (M62.81);Other symptoms and signs involving cognitive  function;Pain Pain - part of body: (unspecified)                Time: 1761-6073 OT Time Calculation (min): 19 min Charges:  OT General Charges $OT Visit: 1 Visit OT Evaluation $OT Eval Moderate Complexity: 1 Mod  Alyha Marines OTR/L Acute Rehabilitation Services Office: [phone]   Rebeca Alert 07/23/2019, 3:11 PM

## 2019-07-23 NOTE — Evaluation (Signed)
Physical Therapy Evaluation Patient Details Name: [name] MRN: [id number] DOB: 17-Sep-1924 Today's Date: 07/23/2019   History of Present Illness  [name] is a 86 y.o. female with medical history significant of hypertension, osteoarthritis, gastritis, Alzheimer's dementia diverticulitis, who arrived to ED from [facility] following unwitnessed fall while attempting to get out of WC. Pt struck head on side table. Pt with R leg wound infection.   Clinical Impression   Pt admitted with above diagnosis. Comes fro [facility], where she is a resident; noted per PA note from 3/27 that she is supposed to be NWB RLE; Presents to PT with decr functional mobility, decr knowledge of precautions, decr activity tolerance;  Pt currently with functional limitations due to the deficits listed below (see PT Problem List). Pt will benefit from skilled PT to increase their independence and safety with mobility to allow discharge to the venue listed below.       Follow Up Recommendations SNF    Equipment Recommendations  Rolling walker with 5" wheels;3in1 (PT)    Recommendations for Other Services       Precautions / Restrictions Precautions Precautions: Fall Required Braces or Orthoses: Knee Immobilizer - Right Restrictions Weight Bearing Restrictions: Yes RLE Weight Bearing: Non weight bearing Other Position/Activity Restrictions: NWB per Placido Sou, PA-C note from 3/27       Mobility  Bed Mobility Overal bed mobility: Needs Assistance Bed Mobility: Supine to Sit     Supine to sit: Min assist;HOB elevated     General bed mobility comments: minA to progress hips to EOB  Transfers Overall transfer level: Needs assistance Equipment used: 2 person hand held assist;1 person hand held assist Transfers: Sit to/from [employer] Sit to Stand: Mod assist;+2 safety/equipment Stand pivot transfers: Max assist;+2 physical assistance;+2  safety/equipment       General transfer comment: maxA+2 for stand-pivot with therapist blocking RLE to maintain NWB status;pt required modA face to face sit<>stand   Ambulation/Gait                Stairs            Wheelchair Mobility    Modified Rankin (Stroke Patients Only)       Balance Overall balance assessment: Needs assistance Sitting-balance support: No upper extremity supported;Feet supported Sitting balance-Leahy Scale: Fair Sitting balance - Comments: able to tolerate static sitting balance   Standing balance support: Bilateral upper extremity supported Standing balance-Leahy Scale: Poor Standing balance comment: modA face to face support for balance in standing                             Pertinent Vitals/Pain Pain Assessment: Faces Faces Pain Scale: Hurts little more Pain Location: generalized, pt unable to specify Pain Descriptors / Indicators: Grimacing Pain Intervention(s): Monitored during session    Home Living Family/patient expects to be discharged to:: Skilled nursing facility                 Additional Comments: pt resident at SNF     Prior Function Level of Independence: Needs assistance   Gait / Transfers Assistance Needed: pt was using WC, had a fall attempting to get out of WC  ADL's / Homemaking Assistance Needed: pt reports having assistance for dressing, pt is a poor historian, anticipate she required assistance for all ADL        Hand Dominance   Dominant Hand: Right    Extremity/Trunk  Assessment   Upper Extremity Assessment Upper Extremity Assessment: Defer to OT evaluation    Lower Extremity Assessment Lower Extremity Assessment: Generalized weakness;RLE deficits/detail RLE Deficits / Details: NWB, pt in knee immobilizer RLE: Unable to fully assess due to immobilization    Cervical / Trunk Assessment Cervical / Trunk Assessment: Kyphotic  Communication   Communication: HOH  Cognition  Arousal/Alertness: Awake/alert Behavior During Therapy: WFL for tasks assessed/performed Overall Cognitive Status: History of cognitive impairments - at baseline                                 General Comments: anticipate pt is at baseline, oriented to name and birthdate      General Comments General comments (skin integrity, edema, etc.): VSS    Exercises     Assessment/Plan    PT Assessment Patient needs continued PT services  PT Problem List Decreased strength;Decreased range of motion;Decreased activity tolerance;Decreased balance;Decreased mobility;Decreased coordination;Decreased cognition;Decreased knowledge of use of DME;Decreased safety awareness;Decreased knowledge of precautions;Pain       PT Treatment Interventions DME instruction;Gait training;Functional mobility training;Therapeutic activities;Therapeutic exercise;Balance training;Cognitive remediation;Wheelchair mobility training;Patient/family education    PT Goals (Current goals can be found in the Care Plan section)  Acute Rehab PT Goals Patient Stated Goal: to feel better PT Goal Formulation: Patient unable to participate in goal setting Time For Goal Achievement: 08/06/19 Potential to Achieve Goals: Fair    Frequency Min 2X/week   Barriers to discharge        Co-evaluation PT/OT/SLP Co-Evaluation/Treatment: Yes Reason for Co-Treatment: Complexity of the patient's impairments (multi-system involvement);To address functional/ADL transfers;Necessary to address cognition/behavior during functional activity PT goals addressed during session: Mobility/safety with mobility OT goals addressed during session: ADL's and self-care       AM-PAC PT "6 Clicks" Mobility  Outcome Measure Help needed turning from your back to your side while in a flat bed without using bedrails?: A Lot Help needed moving from lying on your back to sitting on the side of a flat bed without using bedrails?: A  Little Help needed moving to and from a bed to a chair (including a wheelchair)?: A Lot Help needed standing up from a chair using your arms (e.g., wheelchair or bedside chair)?: A Little Help needed to walk in hospital room?: Total Help needed climbing 3-5 steps with a railing? : Total 6 Click Score: 12    End of Session Equipment Utilized During Treatment: Gait belt;Right knee immobilizer Activity Tolerance: Patient tolerated treatment well Patient left: in chair;with call bell/phone within reach;with chair alarm set Nurse Communication: Mobility status PT Visit Diagnosis: Unsteadiness on feet (R26.81);Other abnormalities of gait and mobility (R26.89)    Time: 0973-5329 PT Time Calculation (min) (ACUTE ONLY): 19 min   Charges:   PT Evaluation $PT Eval Moderate Complexity: 1 Mod          Roney [place], [place]  Acute Rehabilitation Services Pager [phone] Office [phone]   Colletta [place] 07/23/2019, 5:45 PM

## 2019-07-23 NOTE — Plan of Care (Signed)
  Problem: Clinical Measurements: Goal: Ability to maintain clinical measurements within normal limits will improve Outcome: Progressing   

## 2019-07-24 LAB — BASIC METABOLIC PANEL
Anion gap: 11 (ref 5–15)
BUN: 16 mg/dL (ref 8–23)
CO2: 24 mmol/L (ref 22–32)
Calcium: 8.4 mg/dL — ABNORMAL LOW (ref 8.9–10.3)
Chloride: 99 mmol/L (ref 98–111)
Creatinine, Ser: 1.07 mg/dL — ABNORMAL HIGH (ref 0.44–1.00)
GFR calc Af Amer: 52 mL/min — ABNORMAL LOW (ref >60)
GFR calc non Af Amer: 45 mL/min — ABNORMAL LOW (ref >60)
Glucose, Bld: 86 mg/dL (ref 70–99)
Potassium: 4 mmol/L (ref 3.5–5.1)
Sodium: 134 mmol/L — ABNORMAL LOW (ref 135–145)

## 2019-07-24 LAB — CBC
HCT: 29.1 % — ABNORMAL LOW (ref 36.0–46.0)
Hemoglobin: 9.3 g/dL — ABNORMAL LOW (ref 12.0–15.0)
MCH: 28.7 pg (ref 26.0–34.0)
MCHC: 32 g/dL (ref 30.0–36.0)
MCV: 89.8 fL (ref 80.0–100.0)
Platelets: 162 10*3/uL (ref 150–400)
RBC: 3.24 MIL/uL — ABNORMAL LOW (ref 3.87–5.11)
RDW: 16.6 % — ABNORMAL HIGH (ref 11.5–15.5)
WBC: 10.3 10*3/uL (ref 4.0–10.5)
nRBC: 0 % (ref 0.0–0.2)

## 2019-07-24 MED ORDER — OXYCODONE HCL 5 MG PO TABS: 5.0000 mg | ORAL_TABLET | Freq: Four times a day (QID) | ORAL | 0 refills | Status: DC | PRN

## 2019-07-24 MED ORDER — ACETAMINOPHEN 325 MG PO TABS: 650.0000 mg | ORAL_TABLET | Freq: Four times a day (QID) | ORAL | Status: DC | PRN

## 2019-07-24 MED ORDER — CEPHALEXIN 500 MG PO CAPS: 500.0000 mg | ORAL_CAPSULE | Freq: Three times a day (TID) | ORAL | 0 refills | Status: AC

## 2019-07-24 NOTE — TOC Initial Note (Addendum)
Transition of Care [facility]) - Initial/Assessment Note    Patient Details  Name: [name] MRN: [id number] Date of Birth: 1924-11-17  Transition of [facility] CM/SW Contact:    Epifanio Lesches, RN Phone Number: [phone] 07/24/2019, 9:33 AM  Clinical Narrative: Presents with R leg wound infection, s/p recent right total hip replacement. Hx of  hypertension, osteoarthritis, gastritis, Alzheimer's dementia, diverticulitis. From [employer].    NCM spoke with pt's niece Meghan about d/c planning. Meghan stated family would for pt to return to [facility] @ d/c. [facility] made aware of d/c plan per NCM and plans to receive pt once discharges.   PT/OT evals pending....  TOC team will continue to monitor and follow for needs.....  07/24/2019 SNF WORKUP COMLPETE, INSURANCE AUTHORIZATION PENDING  07/24/2019 @ 1445 SNF authorization received:  [id number]    x3 days ,Ms. Hinton Rao CM.  Expected Discharge Plan: Skilled Nursing Facility([facility]) Barriers to Discharge: Continued Medical Work up   Patient Goals and CMS Choice        Expected Discharge Plan and Services Expected Discharge Plan: Skilled Nursing Facility([facility])                                              Prior Living Arrangements/Services                       Activities of Daily Living Home Assistive Devices/Equipment: Environmental consultant (specify type) ADL Screening (condition at time of admission) Patient's cognitive ability adequate to safely complete daily activities?: No Is the patient deaf or have difficulty hearing?: No Does the patient have difficulty seeing, even when wearing glasses/contacts?: No Does the patient have difficulty concentrating, remembering, or making decisions?: Yes Patient able to express need for assistance with ADLs?: Yes Does the patient have difficulty dressing or bathing?: No Independently performs ADLs?: Yes  (appropriate for developmental age) Does the patient have difficulty walking or climbing stairs?: Yes Weakness of Legs: Both Weakness of Arms/Hands: None  Permission Sought/Granted                  Emotional Assessment              Admission diagnosis:  Cellulitis of right lower leg [L03.115] Patient Active Problem List   Diagnosis Date Noted  . Cellulitis of right lower leg 07/23/2019  . Sepsis (HCC) 07/22/2019  . Chronic kidney disease (CKD), stage III (moderate) 12/18/2018  . Aortic atherosclerosis (HCC) 12/19/2017  . Bronchiectasis without complication (HCC) 12/19/2017  . Hypothyroidism 03/31/2015  . CHF (congestive heart failure) (HCC) 11/15/2014  . Absolute anemia 10/18/2014  . Bruising, spontaneous 09/16/2014  . Long term current use of anticoagulant 09/16/2014  . Atrial fibrillation (HCC) 04/15/2014  . Edema 04/15/2014  . Nodule of right lung 05/27/2013  . Memory impairment 03/12/2013  . Prophylactic immunotherapy 10/24/2012  . Dyspnea 07/06/2012  . Extrinsic asthma 08/31/2010  . Atrophic vaginitis   . Malaise and fatigue   . Thoracic scoliosis   . Insomnia, idiopathic   . Gastritis   . Gastroesophageal reflux disease with hiatal hernia   . Colon polyps   . Diverticulosis   . Osteoarthrosis and allied disorders   . Hyperlipidemia 02/11/2009  . Essential hypertension 02/10/2009   PCP:  Raliegh Ip, DO Pharmacy:   [facility] [place] -  [place] [address] Phone: [phone] Fax: [phone]     Social Determinants of Health (SDOH) Interventions    Readmission Risk Interventions No flowsheet data found.

## 2019-07-24 NOTE — Progress Notes (Signed)
1000: IV assessed, clean, dry, and intact, currently infusing. Dressing is transparent, occlusive and C,D, I.  1700: IV removed per order for discharge. Catheter line intact. Site clean, dry, and intact upon assessment. Pt tolerated well.

## 2019-07-24 NOTE — Discharge Summary (Signed)
Physician Discharge Summary  [name]:[id number] DOB: [birth year] DOA: 07/22/2019  PCP: Raliegh Ip, DO  Admit date: 07/22/2019 Discharge date: 07/24/2019  [facility] Disposition:  [facility]   Recommendations for Outpatient Follow-up:  1. Follow up with Orthopedics, Dr. Case as soon as possible to make up for missed appointment  2. Please follow up on the following pending results: urine culture    Home Health: N/A  Equipment/Devices: TBD at SNF  Discharge Condition: Fair  CODE STATUS: FULL Diet recommendation: Regular  Brief/Interim Summary: [name] is a 86 y.o. F with dementia, lives in ALF, HTN who presented with right leg swelling and pain near her surgery site and weakness.  In the ER, patient found to have temp 103F, WBC 15.5 and was confused.  CT head and c-spine unremarkable.  Urinalysis suggested infection.     PRINCIPAL HOSPITAL DIAGNOSIS: UTI    Discharge Diagnoses:   Fever, suspect UTI Sepsis ruled out Doubt superficial surgical site infection SSI seems unlikely 3 weeks out.  Patient's niece reported frequent urination prior to admission.  Admitted and started on IV ceftriaxone.  Symptoms improved.  Urine culture with multiple species and so recollected, repeat pending at time of discharge.  Continue cephalexin to complete 5 days.    Dementia Continue Abilify, BuSpar, donepezil, memantine  COPD No evidence of bronchospasm.  Continue inhalers without change.  Paroxysmal atrial fibrillation Not on anticoagulants, unclear reason, suspect due to fragility. Continue amiodarone, metoprolol  Hypertension Continue metoprolol, losartan, furosemide  Hypothyroidism Continue levothyroxine  CKD stage IIIa Creatinine stable        Discharge Instructions   Allergies as of 07/24/2019      Reactions   Ace Inhibitors Cough   "Allergic," per MAR   Simvastatin Other (See Comments)    Leg pain- "Allergic," per [facility]      Medication List    STOP taking these medications   Eye-Vites Tabs   omeprazole 20 MG capsule Commonly known as: PRILOSEC   venlafaxine XR 37.5 MG 24 hr capsule Commonly known as: EFFEXOR-XR     TAKE these medications   albuterol 108 (90 Base) MCG/ACT inhaler Commonly known as: ProAir HFA Inhale 2 puffs into the lungs every 6 (six) hours as needed. What changed:   when to take this  reasons to take this  Another medication with the same name was removed. Continue taking this medication, and follow the directions you see here.   amiodarone 200 MG tablet Commonly known as: PACERONE TAKE (1/2) TABLET DAILY. What changed:   how much to take  how to take this  when to take this  additional instructions   ARIPiprazole 5 MG tablet Commonly known as: ABILIFY Take 5 mg by mouth daily.   aspirin EC 325 MG tablet Take 325 mg by mouth in the morning and at bedtime.   budesonide-formoterol 160-4.5 MCG/ACT inhaler Commonly known as: SYMBICORT Inhale 2 puffs into the lungs 2 (two) times daily. What changed: Another medication with the same name was removed. Continue taking this medication, and follow the directions you see here.   busPIRone 5 MG tablet Commonly known as: BUSPAR Take 1 tablet (5 mg total) by mouth at bedtime.   cephALEXin 500 MG capsule Commonly known as: KEFLEX Take 1 capsule (500 mg total) by mouth 3 (three) times daily for 2 days.   donepezil 10 MG tablet Commonly known as: ARICEPT Take 1 tablet (10 mg total) by mouth daily. What  changed: when to take this   furosemide 20 MG tablet Commonly known as: LASIX TAKE 2 TABLET A DAY AS DIRECTED. What changed:   how much to take  how to take this  when to take this  additional instructions   levothyroxine 50 MCG tablet Commonly known as: SYNTHROID Take 50 mcg by mouth daily before breakfast. What changed: Another medication with the same name was  removed. Continue taking this medication, and follow the directions you see here.   losartan 100 MG tablet Commonly known as: COZAAR Take 100 mg by mouth in the morning.   memantine 10 MG tablet Commonly known as: NAMENDA TAKE (1) TABLET TWICE A DAY. What changed:   how much to take  how to take this  when to take this  additional instructions   metoprolol succinate 25 MG 24 hr tablet Commonly known as: TOPROL-XL Take 1 tablet (25 mg total) by mouth daily. What changed: when to take this   omeprazole 20 MG tablet Commonly known as: PRILOSEC OTC Take 20 mg by mouth in the morning.   oxyCODONE 5 MG immediate release tablet Commonly known as: Oxy IR/ROXICODONE Take 5 mg by mouth every 6 (six) hours as needed (for pain management).   potassium chloride SA 20 MEQ tablet Commonly known as: KLOR-CON Take 1 tablet (20 mEq total) by mouth daily.   senna 8.6 MG Tabs tablet Commonly known as: SENOKOT Take 2 tablets by mouth 2 (two) times daily.   Spacer/Aero-Holding [employer] Use with symbicort inhaler BID       Allergies  Allergen Reactions  . Ace Inhibitors Cough    "Allergic," per MAR  . Simvastatin Other (See Comments)    [facility]    Consultations:     Procedures/Studies: CT Head Wo Contrast  Result Date: 07/22/2019 CLINICAL DATA:  Unwitnessed fall, head injury, right scalp laceration, dementia EXAM: CT HEAD WITHOUT CONTRAST CT CERVICAL SPINE WITHOUT CONTRAST TECHNIQUE: Multidetector CT imaging of the head and cervical spine was performed following the standard protocol without intravenous contrast. Multiplanar CT image reconstructions of the cervical spine were also generated. COMPARISON:  CT head dated 06/21/2019 FINDINGS: CT HEAD FINDINGS Brain: No evidence of acute infarction, hemorrhage, hydrocephalus, extra-axial collection or mass lesion/mass effect. Mild cortical atrophy. Subcortical white matter and periventricular small vessel  ischemic changes. Vascular: Intracranial atherosclerosis. Skull: Normal. Negative for fracture or focal lesion. Sinuses/Orbits: The visualized paranasal sinuses are essentially clear. The mastoid air cells are unopacified. Other: Soft tissue swelling/laceration overlying the right frontal bone (series 3/image 16). CT CERVICAL SPINE FINDINGS Alignment: Reversal of the normal mid cervical lordosis. Skull base and vertebrae: No acute fracture. No primary bone lesion or focal pathologic process. Soft tissues and spinal canal: No prevertebral fluid or swelling. No visible canal hematoma. Disc levels: C3-4 ACDF with interbody spacer. Mild to moderate multilevel degenerative changes. Spinal canal is patent. Upper chest: Visualized lung apices are notable for emphysematous changes with right apical pleural-parenchymal scarring. Other: Visualized thyroid is unremarkable. IMPRESSION: Soft tissue swelling/laceration overlying the right frontal bone. No evidence of calvarial fracture. No evidence of acute intracranial abnormality. Atrophy with small vessel ischemic changes. No evidence of traumatic injury to the cervical spine. Mild to moderate degenerative changes. C3-4 ACDF without evidence of complication. Electronically Signed   By: Charline BillsSriyesh  Krishnan M.D.   On: 07/22/2019 21:19   CT Cervical Spine Wo Contrast  Result Date: 07/22/2019 CLINICAL DATA:  Unwitnessed fall, head injury, right scalp laceration, dementia  EXAM: CT HEAD WITHOUT CONTRAST CT CERVICAL SPINE WITHOUT CONTRAST TECHNIQUE: Multidetector CT imaging of the head and cervical spine was performed following the standard protocol without intravenous contrast. Multiplanar CT image reconstructions of the cervical spine were also generated. COMPARISON:  CT head dated 06/21/2019 FINDINGS: CT HEAD FINDINGS Brain: No evidence of acute infarction, hemorrhage, hydrocephalus, extra-axial collection or mass lesion/mass effect. Mild cortical atrophy. Subcortical white  matter and periventricular small vessel ischemic changes. Vascular: Intracranial atherosclerosis. Skull: Normal. Negative for fracture or focal lesion. Sinuses/Orbits: The visualized paranasal sinuses are essentially clear. The mastoid air cells are unopacified. Other: Soft tissue swelling/laceration overlying the right frontal bone (series 3/image 16). CT CERVICAL SPINE FINDINGS Alignment: Reversal of the normal mid cervical lordosis. Skull base and vertebrae: No acute fracture. No primary bone lesion or focal pathologic process. Soft tissues and spinal canal: No prevertebral fluid or swelling. No visible canal hematoma. Disc levels: C3-4 ACDF with interbody spacer. Mild to moderate multilevel degenerative changes. Spinal canal is patent. Upper chest: Visualized lung apices are notable for emphysematous changes with right apical pleural-parenchymal scarring. Other: Visualized thyroid is unremarkable. IMPRESSION: Soft tissue swelling/laceration overlying the right frontal bone. No evidence of calvarial fracture. No evidence of acute intracranial abnormality. Atrophy with small vessel ischemic changes. No evidence of traumatic injury to the cervical spine. Mild to moderate degenerative changes. C3-4 ACDF without evidence of complication. Electronically Signed   By: Julian Hy M.D.   On: 07/22/2019 21:19   DG Chest Port 1 View  Result Date: 07/22/2019 CLINICAL DATA:  Shortness of breath EXAM: PORTABLE CHEST 1 VIEW COMPARISON:  June 26, 2019 FINDINGS: The heart size is stable from prior study. Aortic calcifications are noted. There are small bilateral pleural effusions, improved from prior study. Bibasilar atelectasis is again noted. There is no pneumothorax. There are end-stage degenerative changes of both glenohumeral joints. There is superior subluxation of the right humeral head consistent with a rotator cuff injury. IMPRESSION: 1. No acute cardiopulmonary process. 2. Improving trace to small bilateral  pleural effusions with adjacent scarring and atelectasis. Electronically Signed   By: Constance Holster M.D.   On: 07/22/2019 21:17       Subjective: No complaints.  She has some mild redness and pain of her right leg.  No more fever.  Urinary frequency has decreased.  Discharge Exam: Vitals:   07/24/19 0338 07/24/19 0759  BP: (!) 125/55 (!) 120/50  Pulse: 65 67  Resp: 15 16  Temp: 98.1 F (36.7 C) 99.8 F (37.7 C)  SpO2: 98% 93%   Vitals:   07/23/19 1954 07/23/19 2007 07/24/19 0338 07/24/19 0759  BP:  104/81 (!) 125/55 (!) 120/50  Pulse:  70 65 67  Resp:  17 15 16   Temp:  99 F (37.2 C) 98.1 F (36.7 C) 99.8 F (37.7 C)  TempSrc:  Oral Oral Oral  SpO2: 96% 95% 98% 93%  Weight:      Height:        General: Pt is alert, awake, not in acute distress, sitting up in bed, interactive Cardiovascular: RRR, nl S1-S2, no murmurs appreciated.   No LE edema.   Respiratory: Normal respiratory rate and rhythm.  CTAB without rales or wheezes. Abdominal: Abdomen soft and non-tender.  No distension or HSM.   MSK: Right knee as pictured.  Redness resolved.  Joint ROM seems okay, do not suspect this represents infection of joint.     Neuro/Psych: Strength symmetric in upper and lower extremities.  Judgment and  insight appear severely impaired by dementia.   The results of significant diagnostics from this hospitalization (including imaging, microbiology, ancillary and laboratory) are listed below for reference.     Microbiology: Recent Results (from the past 240 hour(s))  Blood Culture (routine x 2)     Status: None (Preliminary result)   Collection Time: 07/22/19  9:33 PM   Specimen: BLOOD LEFT ARM  Result Value Ref Range Status   Specimen Description BLOOD LEFT ARM  Final   Special Requests   Final    BOTTLES DRAWN AEROBIC AND ANAEROBIC Blood Culture results may not be optimal due to an excessive volume of blood received in culture bottles   Culture   Final    NO GROWTH 2  DAYS Performed at [facility], [address], [place], [place] [address]    Report Status PENDING  Incomplete  Urine culture     Status: Abnormal   Collection Time: 07/22/19  9:33 PM   Specimen: In/Out Cath Urine  Result Value Ref Range Status   Specimen Description IN/OUT CATH URINE  Final   Special Requests   Final    NONE Performed at [facility], [address], [place], [place] [address]    Culture MULTIPLE SPECIES PRESENT, SUGGEST RECOLLECTION (A)  Final   Report Status 07/23/2019 FINAL  Final  Blood Culture (routine x 2)     Status: None (Preliminary result)   Collection Time: 07/22/19  9:35 PM   Specimen: BLOOD  Result Value Ref Range Status   Specimen Description BLOOD RIGHT ANTECUBITAL  Final   Special Requests   Final    BOTTLES DRAWN AEROBIC AND ANAEROBIC Blood Culture adequate volume   Culture   Final    NO GROWTH 2 DAYS Performed at [facility], [address], [place], [place] [address]    Report Status PENDING  Incomplete  SARS CORONAVIRUS 2 (TAT 6-24 HRS) Nasopharyngeal Nasopharyngeal Swab     Status: None   Collection Time: 07/23/19  1:40 AM   Specimen: Nasopharyngeal Swab  Result Value Ref Range Status   SARS Coronavirus 2 NEGATIVE NEGATIVE Final    Comment: (NOTE) SARS-CoV-2 target nucleic acids are NOT DETECTED. The SARS-CoV-2 RNA is generally detectable in upper and lower respiratory specimens during the acute phase of infection. Negative results do not preclude SARS-CoV-2 infection, do not rule out co-infections with other pathogens, and should not be used as the sole basis for treatment or other patient management decisions. Negative results must be combined with clinical observations, patient history, and epidemiological information. The expected result is Negative. Fact Sheet for Patients: HairSlick.no Fact Sheet for Healthcare Providers: quierodirigir.com This test is  not yet approved or cleared by the [place] FDA and  has been authorized for detection and/or diagnosis of SARS-CoV-2 by FDA under an Emergency Use Authorization (EUA). This EUA will remain  in effect (meaning this test can be used) for the duration of the COVID-19 declaration under Section 56 4(b)(1) of the Act, 21 U.S.C. section 360bbb-3(b)(1), unless the authorization is terminated or revoked sooner. Performed at [facility], [address] [place], [place] [address]   MRSA PCR Screening     Status: None   Collection Time: 07/23/19 11:22 AM   Specimen: Nasopharyngeal  Result Value Ref Range Status   MRSA by PCR NEGATIVE NEGATIVE Final    Comment:        The GeneXpert MRSA Assay (FDA approved for NASAL specimens  only), is one component of a comprehensive MRSA colonization surveillance program. It is not intended to diagnose MRSA infection nor to guide or monitor treatment for MRSA infections. Performed at [facility], [address] [place], [place] [address]      Labs: BNP (last 3 results) No results for input(s): BNP in the last 8760 hours. Basic Metabolic Panel: Recent Labs  Lab 07/22/19 0001 07/22/19 2135 07/24/19 0548  NA 133* 133* 134*  K 5.1 4.9 4.0  CL 98 99 99  CO2 25 25 24   GLUCOSE 97 138* 86  BUN 24* 21 16  CREATININE 1.46* 1.28* 1.07*  CALCIUM 9.2 9.1 8.4*   Liver Function Tests: Recent Labs  Lab 07/22/19 0001 07/22/19 2135  AST 20 19  ALT 11 11  ALKPHOS 88 77  BILITOT 0.5 0.6  PROT 6.4* 6.1*  ALBUMIN 3.3* 3.3*   No results for input(s): LIPASE, AMYLASE in the last 168 hours. No results for input(s): AMMONIA in the last 168 hours. CBC: Recent Labs  Lab 07/22/19 0001 07/22/19 2135 07/24/19 0548  WBC 7.5 15.5* 10.3  NEUTROABS 5.0 13.9*  --   HGB 11.5* 11.3* 9.3*  HCT 36.7 35.1* 29.1*  MCV 89.5 88.4 89.8  PLT 244 211 162   Cardiac Enzymes: No results for input(s): CKTOTAL, CKMB, CKMBINDEX, TROPONINI in the last  168 hours. BNP: Invalid input(s): POCBNP CBG: No results for input(s): GLUCAP in the last 168 hours. D-Dimer No results for input(s): DDIMER in the last 72 hours. Hgb A1c No results for input(s): HGBA1C in the last 72 hours. Lipid Profile No results for input(s): CHOL, HDL, LDLCALC, TRIG, CHOLHDL, LDLDIRECT in the last 72 hours. Thyroid function studies No results for input(s): TSH, T4TOTAL, T3FREE, THYROIDAB in the last 72 hours.  Invalid input(s): FREET3 Anemia work up No results for input(s): VITAMINB12, FOLATE, FERRITIN, TIBC, IRON, RETICCTPCT in the last 72 hours. Urinalysis    Component Value Date/Time   COLORURINE YELLOW 07/22/2019 2133   APPEARANCEUR CLOUDY (A) 07/22/2019 2133   LABSPEC 1.013 07/22/2019 2133   PHURINE 6.0 07/22/2019 2133   GLUCOSEU NEGATIVE 07/22/2019 2133   HGBUR LARGE (A) 07/22/2019 2133   BILIRUBINUR NEGATIVE 07/22/2019 2133   BILIRUBINUR NEG 01/09/2014 1041   KETONESUR NEGATIVE 07/22/2019 2133   PROTEINUR NEGATIVE 07/22/2019 2133   UROBILINOGEN negative 01/09/2014 1041   UROBILINOGEN 0.2 01/23/2007 0836   NITRITE NEGATIVE 07/22/2019 2133   LEUKOCYTESUR LARGE (A) 07/22/2019 2133   Sepsis Labs Invalid input(s): PROCALCITONIN,  WBC,  LACTICIDVEN Microbiology Recent Results (from the past 240 hour(s))  Blood Culture (routine x 2)     Status: None (Preliminary result)   Collection Time: 07/22/19  9:33 PM   Specimen: BLOOD LEFT ARM  Result Value Ref Range Status   Specimen Description BLOOD LEFT ARM  Final   Special Requests   Final    BOTTLES DRAWN AEROBIC AND ANAEROBIC Blood Culture results may not be optimal due to an excessive volume of blood received in culture bottles   Culture   Final    NO GROWTH 2 DAYS Performed at [facility], [address], [place], [place]    Report Status PENDING  Incomplete  Urine culture     Status: Abnormal   Collection Time: 07/22/19  9:33 PM   Specimen: In/Out Cath Urine  Result Value Ref  Range Status   Specimen Description IN/OUT CATH URINE  Final   Special Requests   Final  NONE Performed at [facility], [address] [place], [place] [address]    Culture MULTIPLE SPECIES PRESENT, SUGGEST RECOLLECTION (A)  Final   Report Status 07/23/2019 FINAL  Final  Blood Culture (routine x 2)     Status: None (Preliminary result)   Collection Time: 07/22/19  9:35 PM   Specimen: BLOOD  Result Value Ref Range Status   Specimen Description BLOOD RIGHT ANTECUBITAL  Final   Special Requests   Final    BOTTLES DRAWN AEROBIC AND ANAEROBIC Blood Culture adequate volume   Culture   Final    NO GROWTH 2 DAYS Performed at [facility], [address] [place], [place] [address]    Report Status PENDING  Incomplete  SARS CORONAVIRUS 2 (TAT 6-24 HRS) Nasopharyngeal Nasopharyngeal Swab     Status: None   Collection Time: 07/23/19  1:40 AM   Specimen: Nasopharyngeal Swab  Result Value Ref Range Status   SARS Coronavirus 2 NEGATIVE NEGATIVE Final    Comment: (NOTE) SARS-CoV-2 target nucleic acids are NOT DETECTED. The SARS-CoV-2 RNA is generally detectable in upper and lower respiratory specimens during the acute phase of infection. Negative results do not preclude SARS-CoV-2 infection, do not rule out co-infections with other pathogens, and should not be used as the sole basis for treatment or other patient management decisions. Negative results must be combined with clinical observations, patient history, and epidemiological information. The expected result is Negative. Fact Sheet for Patients: HairSlick.no Fact Sheet for Healthcare Providers: quierodirigir.com This test is not yet approved or cleared by the [place] FDA and  has been authorized for detection and/or diagnosis of SARS-CoV-2 by FDA under an Emergency Use Authorization (EUA). This EUA will remain  in effect (meaning this test can be used) for the  duration of the COVID-19 declaration under Section 56 4(b)(1) of the Act, 21 U.S.C. section 360bbb-3(b)(1), unless the authorization is terminated or revoked sooner. Performed at [facility], [address] [place], [place] [address]   MRSA PCR Screening     Status: None   Collection Time: 07/23/19 11:22 AM   Specimen: Nasopharyngeal  Result Value Ref Range Status   MRSA by PCR NEGATIVE NEGATIVE Final    Comment:        The GeneXpert MRSA Assay (FDA approved for NASAL specimens only), is one component of a comprehensive MRSA colonization surveillance program. It is not intended to diagnose MRSA infection nor to guide or monitor treatment for MRSA infections. Performed at [facility], [address] [place], [place] [address]      Time coordinating discharge: 35 minutes The [place] controlled substances registry was reviewed for this patient prior to filling the <5 days supply controlled substances script.      SIGNED:   Alberteen Sam, MD  [facility] 07/24/2019, 1:12 PM

## 2019-07-24 NOTE — TOC Transition Note (Signed)
Transition of Care [facility]) - CM/SW Discharge Note   Patient Details  Name: ALIRA FRETWELL MRN: [id number] Date of Birth: Jan 13, 1925  Transition of [facility]) CM/SW Contact:  Epifanio Lesches, RN Phone Number: [phone] 07/24/2019, 2:56 PM   Clinical Narrative:    Patient will DC to: [facility] Anticipated DC date: 07/24/2019 Family notified: Meg[facility] Transport by: Sharin Mons   Per MD patient ready for DC today. RN, patient, patient's family, and facility notified of DC. Discharge Summary and FL2 sent to facility. RN to call report prior to discharge [phone]). Rm # [phone]. DC packet on chart. Ambulance transport requested for patient.   RNCM will sign off for now as intervention is no longer needed. Please consult [place] again if new needs arise.   Final next level of care: Skilled Nursing Facility Barriers to Discharge: No Barriers Identified   Patient Goals and CMS Choice        Discharge Placement                       Discharge Plan and Services                                     Social Determinants of Health (SDOH) Interventions     Readmission Risk Interventions No flowsheet data found.

## 2019-07-24 NOTE — NC FL2 (Signed)
[place] LEVEL OF CARE SCREENING TOOL     IDENTIFICATION  Patient Name: [name] Birthdate: 1925/04/15 Sex: female Admission Date (Current Location): 07/22/2019  [facility] and [place] Number:  Herbalist and Address:  [facility] [place], [place] [address] [place], [place] [address]      Provider Number: [id number]  Attending Physician Name and Address:  Edwin Dada, *  Relative Name and Phone Number:  Loanne Drilling (Niece) [phone]    Current Level of Care: Hospital Recommended Level of Care: [place] Prior Approval Number:    Date Approved/Denied:   PASRR Number: [id number]  Discharge Plan: SNF    Current Diagnoses: Patient Active Problem List   Diagnosis Date Noted  . Cellulitis of right lower leg 07/23/2019  . Sepsis ([place]) 07/22/2019  . Chronic kidney disease (CKD), stage III (moderate) 12/18/2018  . Aortic atherosclerosis ([place]) 12/19/2017  . Bronchiectasis without complication ([place]) 27/09/2374  . Hypothyroidism 03/31/2015  . CHF (congestive heart failure) ([place]) 11/15/2014  . Absolute anemia 10/18/2014  . Bruising, spontaneous 09/16/2014  . Long term current use of anticoagulant 09/16/2014  . Atrial fibrillation ([place]) 04/15/2014  . Edema 04/15/2014  . Nodule of right lung 05/27/2013  . Memory impairment 03/12/2013  . Prophylactic immunotherapy 10/24/2012  . Dyspnea 07/06/2012  . Extrinsic asthma 08/31/2010  . Atrophic vaginitis   . Malaise and fatigue   . Thoracic scoliosis   . Insomnia, idiopathic   . Gastritis   . Gastroesophageal reflux disease with hiatal hernia   . Colon polyps   . Diverticulosis   . Osteoarthrosis and allied disorders   . Hyperlipidemia 02/11/2009  . Essential hypertension 02/10/2009    Orientation RESPIRATION BLADDER Height & Weight     Self  Normal Incontinent Weight: 63.3 kg Height:  5\' 5"  (165.1 cm)  BEHAVIORAL SYMPTOMS/MOOD NEUROLOGICAL BOWEL  NUTRITION STATUS      Continent Diet(refer to d/c summary)  AMBULATORY STATUS COMMUNICATION OF NEEDS Skin   Extensive Assist Verbally Normal(Laceration noted to R side scalp and R shoulder pain.)                       Personal Care Assistance Level of Assistance  Bathing, Feeding, Dressing Bathing Assistance: Maximum assistance Feeding assistance: Limited assistance Dressing Assistance: Maximum assistance     Functional Limitations Info  Sight, Hearing, Speech Sight Info: Adequate Hearing Info: Adequate Speech Info: Adequate    SPECIAL CARE FACTORS FREQUENCY  PT (By licensed PT), OT (By licensed OT)     PT Frequency: 5x / week, evaluate and treat OT Frequency: 5x / week, evaluate and treat            Contractures Contractures Info: Not present    Additional Factors Info  Code Status, Allergies Code Status Info: Full Code Allergies Info: Ace Inhibitors, Simvastatin           Current Medications (07/24/2019):  This is the current hospital active medication list Current Facility-Administered Medications  Medication Dose Route Frequency Provider Last Rate Last Admin  . 0.9 %  sodium chloride infusion   Intravenous Continuous Elwyn Reach, MD 75 mL/hr at 07/23/19 1507 Rate Verify at 07/23/19 1507  . acetaminophen (TYLENOL) tablet 650 mg  650 mg Oral Q6H PRN Elwyn Reach, MD       Or  . acetaminophen (TYLENOL) suppository 650 mg  650 mg Rectal Q6H PRN Elwyn Reach, MD      .  albuterol (PROVENTIL) (2.5 MG/3ML) 0.083% nebulizer solution 3 mL  3 mL Inhalation Q4H PRN Earlie Lou L, MD      . amiodarone (PACERONE) tablet 200 mg  200 mg Oral q AM Rometta Emery, MD   200 mg at 07/24/19 0934  . ARIPiprazole (ABILIFY) tablet 5 mg  5 mg Oral Daily Rometta Emery, MD   5 mg at 07/24/19 0926  . aspirin EC tablet 325 mg  325 mg Oral Daily Earlie Lou L, MD   325 mg at 07/24/19 0926  . busPIRone (BUSPAR) tablet 5 mg  5 mg Oral QHS Rometta Emery,  MD   5 mg at 07/23/19 2242  . cefTRIAXone (ROCEPHIN) 1 g in sodium chloride 0.9 % 100 mL IVPB  1 g Intravenous Q24H Nanavati, Ankit, MD 200 mL/hr at 07/23/19 2039 1 g at 07/23/19 2039  . donepezil (ARICEPT) tablet 10 mg  10 mg Oral QHS Rometta Emery, MD   10 mg at 07/23/19 2037  . enoxaparin (LOVENOX) injection 30 mg  30 mg Subcutaneous Q24H Earlie Lou L, MD   30 mg at 07/24/19 0925  . furosemide (LASIX) tablet 20 mg  20 mg Oral q AM Rometta Emery, MD   20 mg at 07/24/19 0934  . levothyroxine (SYNTHROID) tablet 50 mcg  50 mcg Oral Q0600 Rometta Emery, MD   50 mcg at 07/24/19 0501  . losartan (COZAAR) tablet 100 mg  100 mg Oral q AM Earlie Lou L, MD      . memantine (NAMENDA) tablet 10 mg  10 mg Oral BID Rometta Emery, MD   10 mg at 07/24/19 0927  . metoprolol succinate (TOPROL-XL) 24 hr tablet 25 mg  25 mg Oral q AM Rometta Emery, MD   25 mg at 07/24/19 0934  . mometasone-formoterol (DULERA) 200-5 MCG/ACT inhaler 2 puff  2 puff Inhalation BID Rometta Emery, MD   2 puff at 07/24/19 0806  . ondansetron (ZOFRAN) tablet 4 mg  4 mg Oral Q6H PRN Rometta Emery, MD       Or  . ondansetron (ZOFRAN) injection 4 mg  4 mg Intravenous Q6H PRN Earlie Lou L, MD      . oxyCODONE (Oxy IR/ROXICODONE) immediate release tablet 5 mg  5 mg Oral Q6H PRN Rometta Emery, MD   5 mg at 07/23/19 0839  . potassium chloride (KLOR-CON) CR tablet 10 mEq  10 mEq Oral Daily Rometta Emery, MD   10 mEq at 07/24/19 0926  . senna (SENOKOT) tablet 17.2 mg  2 tablet Oral BID Rometta Emery, MD   17.2 mg at 07/24/19 4854     Discharge Medications: Please see discharge summary for a list of discharge medications.  Relevant Imaging Results:  Relevant Lab Results:   Additional Information [id number]  Epifanio Lesches, RN

## 2019-07-24 NOTE — Plan of Care (Signed)
Pt discharging to [facility], called report to Caryl Asp, [place] at 1515. Discharge instructions provided to transport to give to SNF. Packed all personal belongings. No further questions or concerns voiced. Awaiting PTAR for transportation.   Problem: Education: Goal: Knowledge of General Education information will improve Description: Including pain rating scale, medication(s)/side effects and non-pharmacologic comfort measures Outcome: Completed/Met   Problem: Health Behavior/Discharge Planning: Goal: Ability to manage health-related needs will improve Outcome: Completed/Met   Problem: Clinical Measurements: Goal: Ability to maintain clinical measurements within normal limits will improve Outcome: Completed/Met Goal: Will remain free from infection Outcome: Completed/Met Goal: Diagnostic test results will improve Outcome: Completed/Met Goal: Respiratory complications will improve Outcome: Completed/Met Goal: Cardiovascular complication will be avoided Outcome: Completed/Met   Problem: Activity: Goal: Risk for activity intolerance will decrease Outcome: Completed/Met   Problem: Nutrition: Goal: Adequate nutrition will be maintained Outcome: Completed/Met   Problem: Pain Managment: Goal: General experience of comfort will improve Outcome: Completed/Met   Problem: Safety: Goal: Ability to remain free from injury will improve Outcome: Completed/Met   Problem: Skin Integrity: Goal: Risk for impaired skin integrity will decrease Outcome: Completed/Met

## 2019-07-25 LAB — URINE CULTURE

## 2019-07-27 LAB — CULTURE, BLOOD (ROUTINE X 2)
Culture: NO GROWTH
Culture: NO GROWTH
Special Requests: ADEQUATE

## 2019-07-28 NOTE — ED Provider Notes (Signed)
[facility] Provider Note   CSN: [id number] Arrival date & time: 07/22/19  1842     History No chief complaint on file.   [name] is a 86 y.o. female.  HPI    No 5 caveat for dementia  86 year old female comes in a chief complaint of fall and laceration. Patient with recent femur fracture secondary to fall status post ORIF at outside hospital.  Patient had an unwitnessed fall while trying to get out of wheelchair.  She is noted to have laceration to hand.  Patient reports having some headache and pain to her hand.  She is not on any blood thinners.  Patient's daughter is at the bedside and providing further history.  She reports that patient had ORIF recently and that follow-up appointment with the orthopedist did not reveal any signs of infection.  There was some purulent drainage that was noted by the facility yesterday and patient was sent to the ER for it.  Past Medical History:  Diagnosis Date  . Abdominal pain   . Atrophic vaginitis   . Cellulitis 06/2019  . Colon polyps    Dr Ewing Schlein  . Diverticulosis    Dr Ewing Schlein  . Essential hypertension, benign   . Gastritis   . Hiatal hernia   . Insomnia, idiopathic   . Malaise and fatigue   . Osteoarthrosis and allied disorders   . Other and unspecified hyperlipidemia   . Thoracic scoliosis     Patient Active Problem List   Diagnosis Date Noted  . Cellulitis of right lower leg 07/23/2019  . Sepsis (HCC) 07/22/2019  . Chronic kidney disease (CKD), stage III (moderate) 12/18/2018  . Aortic atherosclerosis (H[facility]) 12/19/2017  . Bronchiectasis without complication (H[facility]) 12/19/2017  . Hypothyroidism 03/31/2015  . CHF (congestive heart failure) (H[facility]) 11/15/2014  . Absolute anemia 10/18/2014  . Bruising, spontaneous 09/16/2014  . Long term current use of anticoagulant 09/16/2014  . Atrial fibrillation (H[facility]) 04/15/2014  . Edema 04/15/2014  . Nodule of right lung 05/27/2013  .  Memory impairment 03/12/2013  . Prophylactic immunotherapy 10/24/2012  . Dyspnea 07/06/2012  . Extrinsic asthma 08/31/2010  . Atrophic vaginitis   . Malaise and fatigue   . Thoracic scoliosis   . Insomnia, idiopathic   . Gastritis   . Gastroesophageal reflux disease with hiatal hernia   . Colon polyps   . Diverticulosis   . Osteoarthrosis and allied disorders   . Hyperlipidemia 02/11/2009  . Essential hypertension 02/10/2009    Past Surgical History:  Procedure Laterality Date  . JOINT REPLACEMENT    . left shoulder surgery     . rt knee   10 1 08   Dr Thomasena Edis  . Rt rotator  cuff repair  4 1 00  . TOTAL HIP ARTHROPLASTY Right 2021  ?     OB History   No obstetric history on file.     Family History  Problem Relation Age of Onset  . Heart disease Father 83  . Cancer Sister     Social History   Tobacco Use  . Smoking status: Former Smoker    Packs/day: 0.10    Years: 5.00    Pack years: 0.50    Types: Cigarettes  . Smokeless tobacco: Never Used  . Tobacco comment: pt states only smoke socially in teens  Substance Use Topics  . Alcohol use: No  . Drug use: No    Home Medications Prior to Admission medications  Medication Sig Start Date End Date Taking? Authorizing Provider  albuterol (PROAIR HFA) 108 (90 Base) MCG/ACT inhaler Inhale 2 puffs into the lungs every 6 (six) hours as needed. Patient taking differently: Inhale 2 puffs into the lungs every 4 (four) hours as needed for wheezing or shortness of breath.  06/19/18  Yes Ernestina Penna, MD  amiodarone (PACERONE) 200 MG tablet TAKE (1/2) TABLET DAILY. Patient taking differently: Take 200 mg by mouth in the morning.  06/19/18  Yes Ernestina Penna, MD  ARIPiprazole (ABILIFY) 5 MG tablet Take 5 mg by mouth daily.   Yes [provider]  aspirin EC 325 MG tablet Take 325 mg by mouth in the morning and at bedtime. 07/04/19 08/03/19 Yes [provider]  budesonide-formoterol (SYMBICORT) 160-4.5  MCG/ACT inhaler Inhale 2 puffs into the lungs 2 (two) times daily.   Yes [provider]  busPIRone (BUSPAR) 5 MG tablet Take 1 tablet (5 mg total) by mouth at bedtime. 06/19/18  Yes Ernestina Penna, MD  donepezil (ARICEPT) 10 MG tablet Take 1 tablet (10 mg total) by mouth daily. Patient taking differently: Take 10 mg by mouth at bedtime.  06/19/18  Yes Ernestina Penna, MD  furosemide (LASIX) 20 MG tablet TAKE 2 TABLET A DAY AS DIRECTED. Patient taking differently: Take 20 mg by mouth in the morning.  06/19/18  Yes Ernestina Penna, MD  levothyroxine (SYNTHROID) 50 MCG tablet Take 50 mcg by mouth daily before breakfast.   Yes [provider]  losartan (COZAAR) 100 MG tablet Take 100 mg by mouth in the morning.   Yes [provider]  memantine (NAMENDA) 10 MG tablet TAKE (1) TABLET TWICE A DAY. Patient taking differently: Take 10 mg by mouth 2 (two) times daily.  06/19/18  Yes Ernestina Penna, MD  metoprolol succinate (TOPROL-XL) 25 MG 24 hr tablet Take 1 tablet (25 mg total) by mouth daily. Patient taking differently: Take 25 mg by mouth in the morning.  06/19/18  Yes Ernestina Penna, MD  omeprazole (PRILOSEC OTC) 20 MG tablet Take 20 mg by mouth in the morning.   Yes [provider]  potassium chloride SA (K-DUR,KLOR-CON) 20 MEQ tablet Take 1 tablet (20 mEq total) by mouth daily. 06/19/18  Yes Ernestina Penna, MD  senna (SENOKOT) 8.6 MG TABS tablet Take 2 tablets by mouth 2 (two) times daily.   Yes [provider]  acetaminophen (TYLENOL) 325 MG tablet Take 2 tablets (650 mg total) by mouth every 6 (six) hours as needed for mild pain (or Fever >/= 101). 07/24/19   Danford, Earl Lites, MD  oxyCODONE (OXY IR/ROXICODONE) 5 MG immediate release tablet Take 1 tablet (5 mg total) by mouth every 6 (six) hours as needed (for pain management). 07/24/19   Alberteen Sam, MD  Spacer/Aero-Holding Rudean Curt Use with symbicort inhaler BID 06/19/18   Ernestina Penna, MD    Allergies    Ace inhibitors and Simvastatin  Review of Systems   Review of Systems  Unable to perform ROS: Dementia    Physical Exam Updated Vital Signs BP (!) 119/57 (BP Location: Left Arm)   Pulse 65   Temp 98.6 F (37 C) (Oral)   Resp 15   Ht 5\' 5"  (1.651 m)   Wt 63.3 kg   SpO2 95%   BMI 23.22 kg/m   Physical Exam Vitals and nursing note reviewed.  Constitutional:      Appearance: She is well-developed.  HENT:  Head: Normocephalic and atraumatic.  Cardiovascular:     Rate and Rhythm: Normal rate.  Pulmonary:     Effort: Pulmonary effort is normal.  Abdominal:     General: Bowel sounds are normal.  Musculoskeletal:     Cervical back: Normal range of motion and neck supple.  Skin:    General: Skin is warm and dry.     Findings: Erythema present.     Comments: Right leg he has a postop wound with surrounding erythema and scab formation.  There is some yellow drainage but no fluctuance.  Mild warmth to touch appreciated.  Neurological:     Mental Status: She is alert. Mental status is at baseline.     ED Results / Procedures / Treatments   Labs (all labs ordered are listed, but only abnormal results are displayed) Labs Reviewed  URINE CULTURE - Abnormal; Notable for the following components:      Result Value   Culture MULTIPLE SPECIES PRESENT, SUGGEST RECOLLECTION (*)    All other components within normal limits  URINE CULTURE - Abnormal; Notable for the following components:   Culture MULTIPLE SPECIES PRESENT, SUGGEST RECOLLECTION (*)    All other components within normal limits  COMPREHENSIVE METABOLIC PANEL - Abnormal; Notable for the following components:   Sodium 133 (*)    Glucose, Bld 138 (*)    Creatinine, Ser 1.28 (*)    Total Protein 6.1 (*)    Albumin 3.3 (*)    GFR calc non Af Amer 36 (*)    GFR calc Af Amer 42 (*)    All other components within normal limits  CBC WITH DIFFERENTIAL/PLATELET - Abnormal; Notable for the  following components:   WBC 15.5 (*)    Hemoglobin 11.3 (*)    HCT 35.1 (*)    RDW 16.3 (*)    Neutro Abs 13.9 (*)    Lymphs Abs 0.6 (*)    Abs Immature Granulocytes 0.08 (*)    All other components within normal limits  URINALYSIS, ROUTINE W REFLEX MICROSCOPIC - Abnormal; Notable for the following components:   APPearance CLOUDY (*)    Hgb urine dipstick LARGE (*)    Leukocytes,Ua LARGE (*)    Bacteria, UA MANY (*)    All other components within normal limits  CBC - Abnormal; Notable for the following components:   RBC 3.24 (*)    Hemoglobin 9.3 (*)    HCT 29.1 (*)    RDW 16.6 (*)    All other components within normal limits  BASIC METABOLIC PANEL - Abnormal; Notable for the following components:   Sodium 134 (*)    Creatinine, Ser 1.07 (*)    Calcium 8.4 (*)    GFR calc non Af Amer 45 (*)    GFR calc Af Amer 52 (*)    All other components within normal limits  CULTURE, BLOOD (ROUTINE X 2)  CULTURE, BLOOD (ROUTINE X 2)  SARS CORONAVIRUS 2 (TAT 6-24 HRS)  MRSA PCR SCREENING  LACTIC ACID, PLASMA  LACTIC ACID, PLASMA  APTT  PROTIME-INR    EKG EKG Interpretation  Date/Time:  Sunday July 22 2019 21:17:56 EDT Ventricular Rate:  82 PR Interval:    QRS Duration: 93 QT Interval:  377 QTC Calculation: 441 R Axis:   55 Text Interpretation: Sinus rhythm Borderline prolonged PR interval No acute changes No significant change since last tracing Confirmed by Varney Biles ([id number]) on 07/22/2019 9:40:45 PM   Radiology No results found.  Procedures Procedures (  including critical care time)  Medications Ordered in ED Medications  lidocaine-EPINEPHrine (XYLOCAINE W/EPI) 2 %-1:200000 (PF) injection 10 mL (10 mLs Infiltration Given 07/22/19 2330)  acetaminophen (TYLENOL) tablet 650 mg (650 mg Oral Given 07/22/19 2138)  sodium chloride 0.9 % bolus 500 mL (0 mLs Intravenous Stopped 07/22/19 2318)  vancomycin (VANCOCIN) IVPB 1000 mg/200 mL premix (0 mg Intravenous Stopped 07/23/19  0122)    ED Course  I have reviewed the triage vital signs and the nursing notes.  Pertinent labs & imaging results that were available during my care of the patient were reviewed by me and considered in my medical decision making (see chart for details).    MDM Rules/Calculators/A&P                     Patient comes in primarily with chief complaint of fall.  She is noted to have a laceration to her forehead that was repaired by APP. Differential diagnosis given her age also includes intracranial hemorrhage, skull fracture therefore CT head ordered and it is negative.  Additionally family reports that she has perhaps some signs of infection in her post surgical wound.  She had a femur fracture recently and is at a rehab recovering from it.  On exam there is evidence of drainage that is yellow-green.  No fluctuance.  Likely purulent.  Postop visit initially was fine there for suspicion for hardware infection is much lower right now.  Patient did spike a fever.  Clinically not septic.  Antibiotic started.   Final Clinical Impression(s) / ED Diagnoses Final diagnoses:  Cellulitis of right lower extremity    Rx / DC Orders ED Discharge Orders         Ordered    cephALEXin (KEFLEX) 500 MG capsule  3 times daily     07/24/19 1312    acetaminophen (TYLENOL) 325 MG tablet  Every 6 hours PRN     07/24/19 1314    oxyCODONE (OXY IR/ROXICODONE) 5 MG immediate release tablet  Every 6 hours PRN     07/24/19 1527           Derwood Kaplan, MD 07/28/19 1547

## 2019-08-18 ENCOUNTER — Emergency Department (HOSPITAL_COMMUNITY): Payer: Medicare Other

## 2019-08-18 ENCOUNTER — Inpatient Hospital Stay (HOSPITAL_COMMUNITY)
Admission: EM | Admit: 2019-08-18 | Discharge: 2019-08-24 | DRG: 500 | Disposition: A | Discharge status: Skilled Nursing Facility | Payer: Medicare Other | Attending: Internal Medicine | Admitting: Internal Medicine

## 2019-08-18 ENCOUNTER — Other Ambulatory Visit: Payer: Self-pay

## 2019-08-18 ENCOUNTER — Encounter (HOSPITAL_COMMUNITY): Payer: Self-pay | Admitting: Emergency Medicine

## 2019-08-18 DIAGNOSIS — T847XXS Infection and inflammatory reaction due to other internal orthopedic prosthetic devices, implants and grafts, sequela: Secondary | ICD-10-CM | POA: Diagnosis not present

## 2019-08-18 DIAGNOSIS — T8459XS Infection and inflammatory reaction due to other internal joint prosthesis, sequela: Secondary | ICD-10-CM | POA: Diagnosis not present

## 2019-08-18 DIAGNOSIS — K269 Duodenal ulcer, unspecified as acute or chronic, without hemorrhage or perforation: Secondary | ICD-10-CM

## 2019-08-18 DIAGNOSIS — Y831 Surgical operation with implant of artificial internal device as the cause of abnormal reaction of the patient, or of later complication, without mention of misadventure at the time of the procedure: Secondary | ICD-10-CM | POA: Diagnosis present

## 2019-08-18 DIAGNOSIS — Z87891 Personal history of nicotine dependence: Secondary | ICD-10-CM | POA: Diagnosis not present

## 2019-08-18 DIAGNOSIS — K5790 Diverticulosis of intestine, part unspecified, without perforation or abscess without bleeding: Secondary | ICD-10-CM | POA: Diagnosis present

## 2019-08-18 DIAGNOSIS — T8141XA Infection following a procedure, superficial incisional surgical site, initial encounter: Secondary | ICD-10-CM | POA: Diagnosis not present

## 2019-08-18 DIAGNOSIS — F0390 Unspecified dementia without behavioral disturbance: Secondary | ICD-10-CM | POA: Diagnosis not present

## 2019-08-18 DIAGNOSIS — T8453XA Infection and inflammatory reaction due to internal right knee prosthesis, initial encounter: Principal | ICD-10-CM | POA: Diagnosis present

## 2019-08-18 DIAGNOSIS — Z96651 Presence of right artificial knee joint: Secondary | ICD-10-CM

## 2019-08-18 DIAGNOSIS — T8459XA Infection and inflammatory reaction due to other internal joint prosthesis, initial encounter: Secondary | ICD-10-CM | POA: Diagnosis not present

## 2019-08-18 DIAGNOSIS — I480 Paroxysmal atrial fibrillation: Secondary | ICD-10-CM | POA: Diagnosis present

## 2019-08-18 DIAGNOSIS — M009 Pyogenic arthritis, unspecified: Secondary | ICD-10-CM | POA: Diagnosis not present

## 2019-08-18 DIAGNOSIS — Z96659 Presence of unspecified artificial knee joint: Secondary | ICD-10-CM | POA: Diagnosis not present

## 2019-08-18 DIAGNOSIS — Z8781 Personal history of (healed) traumatic fracture: Secondary | ICD-10-CM | POA: Diagnosis not present

## 2019-08-18 DIAGNOSIS — D649 Anemia, unspecified: Secondary | ICD-10-CM | POA: Diagnosis present

## 2019-08-18 DIAGNOSIS — K222 Esophageal obstruction: Secondary | ICD-10-CM | POA: Diagnosis present

## 2019-08-18 DIAGNOSIS — N183 Chronic kidney disease, stage 3 unspecified: Secondary | ICD-10-CM | POA: Diagnosis present

## 2019-08-18 DIAGNOSIS — Z8719 Personal history of other diseases of the digestive system: Secondary | ICD-10-CM

## 2019-08-18 DIAGNOSIS — R571 Hypovolemic shock: Secondary | ICD-10-CM | POA: Diagnosis present

## 2019-08-18 DIAGNOSIS — I110 Hypertensive heart disease with heart failure: Secondary | ICD-10-CM | POA: Diagnosis present

## 2019-08-18 DIAGNOSIS — J479 Bronchiectasis, uncomplicated: Secondary | ICD-10-CM | POA: Diagnosis present

## 2019-08-18 DIAGNOSIS — K922 Gastrointestinal hemorrhage, unspecified: Secondary | ICD-10-CM | POA: Diagnosis present

## 2019-08-18 DIAGNOSIS — I4891 Unspecified atrial fibrillation: Secondary | ICD-10-CM | POA: Diagnosis not present

## 2019-08-18 DIAGNOSIS — I130 Hypertensive heart and chronic kidney disease with heart failure and stage 1 through stage 4 chronic kidney disease, or unspecified chronic kidney disease: Secondary | ICD-10-CM | POA: Diagnosis present

## 2019-08-18 DIAGNOSIS — Z8249 Family history of ischemic heart disease and other diseases of the circulatory system: Secondary | ICD-10-CM

## 2019-08-18 DIAGNOSIS — K449 Diaphragmatic hernia without obstruction or gangrene: Secondary | ICD-10-CM | POA: Diagnosis present

## 2019-08-18 DIAGNOSIS — E860 Dehydration: Secondary | ICD-10-CM | POA: Diagnosis present

## 2019-08-18 DIAGNOSIS — K529 Noninfective gastroenteritis and colitis, unspecified: Secondary | ICD-10-CM | POA: Diagnosis present

## 2019-08-18 DIAGNOSIS — T8130XA Disruption of wound, unspecified, initial encounter: Secondary | ICD-10-CM | POA: Diagnosis present

## 2019-08-18 DIAGNOSIS — K259 Gastric ulcer, unspecified as acute or chronic, without hemorrhage or perforation: Secondary | ICD-10-CM

## 2019-08-18 DIAGNOSIS — T8189XA Other complications of procedures, not elsewhere classified, initial encounter: Secondary | ICD-10-CM | POA: Diagnosis not present

## 2019-08-18 DIAGNOSIS — J45909 Unspecified asthma, uncomplicated: Secondary | ICD-10-CM | POA: Diagnosis present

## 2019-08-18 DIAGNOSIS — F0391 Unspecified dementia with behavioral disturbance: Secondary | ICD-10-CM | POA: Diagnosis not present

## 2019-08-18 DIAGNOSIS — I5032 Chronic diastolic (congestive) heart failure: Secondary | ICD-10-CM | POA: Diagnosis present

## 2019-08-18 DIAGNOSIS — E785 Hyperlipidemia, unspecified: Secondary | ICD-10-CM | POA: Diagnosis present

## 2019-08-18 DIAGNOSIS — Z888 Allergy status to other drugs, medicaments and biological substances status: Secondary | ICD-10-CM | POA: Diagnosis not present

## 2019-08-18 DIAGNOSIS — K264 Chronic or unspecified duodenal ulcer with hemorrhage: Secondary | ICD-10-CM | POA: Diagnosis present

## 2019-08-18 DIAGNOSIS — N179 Acute kidney failure, unspecified: Secondary | ICD-10-CM | POA: Diagnosis not present

## 2019-08-18 DIAGNOSIS — I959 Hypotension, unspecified: Secondary | ICD-10-CM | POA: Diagnosis present

## 2019-08-18 DIAGNOSIS — M979XXA Periprosthetic fracture around unspecified internal prosthetic joint, initial encounter: Secondary | ICD-10-CM | POA: Diagnosis present

## 2019-08-18 DIAGNOSIS — A419 Sepsis, unspecified organism: Secondary | ICD-10-CM | POA: Diagnosis present

## 2019-08-18 DIAGNOSIS — N170 Acute kidney failure with tubular necrosis: Secondary | ICD-10-CM | POA: Diagnosis present

## 2019-08-18 DIAGNOSIS — N1831 Chronic kidney disease, stage 3a: Secondary | ICD-10-CM | POA: Diagnosis present

## 2019-08-18 DIAGNOSIS — N189 Chronic kidney disease, unspecified: Secondary | ICD-10-CM | POA: Diagnosis not present

## 2019-08-18 DIAGNOSIS — K2970 Gastritis, unspecified, without bleeding: Secondary | ICD-10-CM | POA: Diagnosis present

## 2019-08-18 DIAGNOSIS — R627 Adult failure to thrive: Secondary | ICD-10-CM | POA: Diagnosis present

## 2019-08-18 DIAGNOSIS — E039 Hypothyroidism, unspecified: Secondary | ICD-10-CM | POA: Diagnosis present

## 2019-08-18 DIAGNOSIS — E861 Hypovolemia: Secondary | ICD-10-CM | POA: Diagnosis present

## 2019-08-18 DIAGNOSIS — Z96641 Presence of right artificial hip joint: Secondary | ICD-10-CM | POA: Diagnosis present

## 2019-08-18 DIAGNOSIS — Z809 Family history of malignant neoplasm, unspecified: Secondary | ICD-10-CM

## 2019-08-18 DIAGNOSIS — K219 Gastro-esophageal reflux disease without esophagitis: Secondary | ICD-10-CM | POA: Diagnosis present

## 2019-08-18 DIAGNOSIS — I482 Chronic atrial fibrillation, unspecified: Secondary | ICD-10-CM | POA: Diagnosis present

## 2019-08-18 DIAGNOSIS — Z79899 Other long term (current) drug therapy: Secondary | ICD-10-CM

## 2019-08-18 DIAGNOSIS — R578 Other shock: Secondary | ICD-10-CM | POA: Diagnosis not present

## 2019-08-18 DIAGNOSIS — Z20822 Contact with and (suspected) exposure to covid-19: Secondary | ICD-10-CM | POA: Diagnosis present

## 2019-08-18 DIAGNOSIS — K2980 Duodenitis without bleeding: Secondary | ICD-10-CM | POA: Diagnosis present

## 2019-08-18 DIAGNOSIS — T847XXD Infection and inflammatory reaction due to other internal orthopedic prosthetic devices, implants and grafts, subsequent encounter: Secondary | ICD-10-CM

## 2019-08-18 DIAGNOSIS — Z79890 Hormone replacement therapy (postmenopausal): Secondary | ICD-10-CM

## 2019-08-18 DIAGNOSIS — D62 Acute posthemorrhagic anemia: Secondary | ICD-10-CM | POA: Diagnosis not present

## 2019-08-18 DIAGNOSIS — Z79891 Long term (current) use of opiate analgesic: Secondary | ICD-10-CM

## 2019-08-18 DIAGNOSIS — T847XXA Infection and inflammatory reaction due to other internal orthopedic prosthetic devices, implants and grafts, initial encounter: Secondary | ICD-10-CM

## 2019-08-18 DIAGNOSIS — Z7951 Long term (current) use of inhaled steroids: Secondary | ICD-10-CM

## 2019-08-18 DIAGNOSIS — G4700 Insomnia, unspecified: Secondary | ICD-10-CM | POA: Diagnosis present

## 2019-08-18 HISTORY — DX: Heart failure, unspecified: I50.9

## 2019-08-18 LAB — URINALYSIS, ROUTINE W REFLEX MICROSCOPIC
Bilirubin Urine: NEGATIVE
Glucose, UA: NEGATIVE mg/dL
Hgb urine dipstick: NEGATIVE
Ketones, ur: NEGATIVE mg/dL
Nitrite: NEGATIVE
Protein, ur: NEGATIVE mg/dL
Renal Epithelial: 1
Specific Gravity, Urine: 1.015 (ref 1.005–1.030)
pH: 5 (ref 5.0–8.0)

## 2019-08-18 LAB — CBC WITH DIFFERENTIAL/PLATELET
Abs Immature Granulocytes: 0.07 10*3/uL (ref 0.00–0.07)
Basophils Absolute: 0 10*3/uL (ref 0.0–0.1)
Basophils Relative: 0 %
Eosinophils Absolute: 0 10*3/uL (ref 0.0–0.5)
Eosinophils Relative: 0 %
HCT: 34.2 % — ABNORMAL LOW (ref 36.0–46.0)
Hemoglobin: 11.1 g/dL — ABNORMAL LOW (ref 12.0–15.0)
Immature Granulocytes: 1 %
Lymphocytes Relative: 10 %
Lymphs Abs: 1.3 10*3/uL (ref 0.7–4.0)
MCH: 28.7 pg (ref 26.0–34.0)
MCHC: 32.5 g/dL (ref 30.0–36.0)
MCV: 88.4 fL (ref 80.0–100.0)
Monocytes Absolute: 0.8 10*3/uL (ref 0.1–1.0)
Monocytes Relative: 6 %
Neutro Abs: 10.8 10*3/uL — ABNORMAL HIGH (ref 1.7–7.7)
Neutrophils Relative %: 83 %
Platelets: 227 10*3/uL (ref 150–400)
RBC: 3.87 MIL/uL (ref 3.87–5.11)
RDW: 16.2 % — ABNORMAL HIGH (ref 11.5–15.5)
WBC: 13 10*3/uL — ABNORMAL HIGH (ref 4.0–10.5)
nRBC: 0 % (ref 0.0–0.2)

## 2019-08-18 LAB — COMPREHENSIVE METABOLIC PANEL
ALT: 25 U/L (ref 0–44)
AST: 31 U/L (ref 15–41)
Albumin: 2.3 g/dL — ABNORMAL LOW (ref 3.5–5.0)
Alkaline Phosphatase: 88 U/L (ref 38–126)
Anion gap: 8 (ref 5–15)
BUN: 119 mg/dL — ABNORMAL HIGH (ref 8–23)
CO2: 21 mmol/L — ABNORMAL LOW (ref 22–32)
Calcium: 8.3 mg/dL — ABNORMAL LOW (ref 8.9–10.3)
Chloride: 108 mmol/L (ref 98–111)
Creatinine, Ser: 2.97 mg/dL — ABNORMAL HIGH (ref 0.44–1.00)
GFR calc Af Amer: 15 mL/min — ABNORMAL LOW (ref >60)
GFR calc non Af Amer: 13 mL/min — ABNORMAL LOW (ref >60)
Glucose, Bld: 105 mg/dL — ABNORMAL HIGH (ref 70–99)
Potassium: 4.4 mmol/L (ref 3.5–5.1)
Sodium: 137 mmol/L (ref 135–145)
Total Bilirubin: 0.7 mg/dL (ref 0.3–1.2)
Total Protein: 4.5 g/dL — ABNORMAL LOW (ref 6.5–8.1)

## 2019-08-18 LAB — ABO/RH: ABO/RH(D): O POS

## 2019-08-18 LAB — POC OCCULT BLOOD, ED: Fecal Occult Bld: POSITIVE — AB

## 2019-08-18 LAB — CREATININE, URINE, RANDOM: Creatinine, Urine: 24.92 mg/dL

## 2019-08-18 LAB — LACTIC ACID, PLASMA: Lactic Acid, Venous: 1.3 mmol/L (ref 0.5–1.9)

## 2019-08-18 LAB — SODIUM, URINE, RANDOM: Sodium, Ur: 71 mmol/L

## 2019-08-18 MED ORDER — SODIUM CHLORIDE 0.9 % IV SOLN
2.0000 g | Freq: Every day | INTRAVENOUS | Status: DC
  Administered 2019-08-18 – 2019-08-21 (×4): 2 g via INTRAVENOUS
  Filled 2019-08-18: qty 20
  Filled 2019-08-18 (×2): qty 2
  Filled 2019-08-18 (×2): qty 20

## 2019-08-18 MED ORDER — PANTOPRAZOLE SODIUM 40 MG IV SOLR
40.0000 mg | Freq: Two times a day (BID) | INTRAVENOUS | Status: DC
  Administered 2019-08-19 – 2019-08-23 (×9): 40 mg via INTRAVENOUS
  Filled 2019-08-18 (×9): qty 40

## 2019-08-18 MED ORDER — ACETAMINOPHEN 325 MG PO TABS
650.0000 mg | ORAL_TABLET | Freq: Four times a day (QID) | ORAL | Status: DC | PRN
  Filled 2019-08-18: qty 2

## 2019-08-18 MED ORDER — METRONIDAZOLE IN NACL 5-0.79 MG/ML-% IV SOLN
500.0000 mg | Freq: Once | INTRAVENOUS | Status: AC
  Administered 2019-08-18: 500 mg via INTRAVENOUS
  Filled 2019-08-18: qty 100

## 2019-08-18 MED ORDER — SODIUM CHLORIDE 0.9 % IV BOLUS
1000.0000 mL | Freq: Once | INTRAVENOUS | Status: AC
  Administered 2019-08-18: 1000 mL via INTRAVENOUS

## 2019-08-18 MED ORDER — SODIUM CHLORIDE 0.9 % IV SOLN: INTRAVENOUS | Status: AC

## 2019-08-18 MED ORDER — METRONIDAZOLE IN NACL 5-0.79 MG/ML-% IV SOLN
500.0000 mg | Freq: Three times a day (TID) | INTRAVENOUS | Status: DC
  Administered 2019-08-19 – 2019-08-22 (×10): 500 mg via INTRAVENOUS
  Filled 2019-08-18 (×10): qty 100

## 2019-08-18 MED ORDER — ACETAMINOPHEN 650 MG RE SUPP
650.0000 mg | Freq: Four times a day (QID) | RECTAL | Status: DC | PRN
  Administered 2019-08-19: 650 mg via RECTAL
  Filled 2019-08-18: qty 1

## 2019-08-18 MED ORDER — SODIUM CHLORIDE 0.9 % IV BOLUS
500.0000 mL | Freq: Once | INTRAVENOUS | Status: AC
  Administered 2019-08-18: 500 mL via INTRAVENOUS

## 2019-08-18 MED ORDER — CIPROFLOXACIN IN D5W 400 MG/200ML IV SOLN
400.0000 mg | Freq: Once | INTRAVENOUS | Status: AC
Start: 2019-08-18 — End: 2019-08-18
  Administered 2019-08-18: 400 mg via INTRAVENOUS
  Filled 2019-08-18: qty 200

## 2019-08-18 MED ORDER — PANTOPRAZOLE SODIUM 40 MG IV SOLR
40.0000 mg | Freq: Once | INTRAVENOUS | Status: AC
  Administered 2019-08-18: 18:00:00 40 mg via INTRAVENOUS
  Filled 2019-08-18: qty 40

## 2019-08-18 MED ORDER — SODIUM CHLORIDE 0.9 % IV BOLUS
1000.0000 mL | Freq: Once | INTRAVENOUS | Status: AC
  Administered 2019-08-18: 20:00:00 1000 mL via INTRAVENOUS

## 2019-08-18 NOTE — Consult Note (Signed)
NAME:  [name], MRN:  [id number], DOB:  [birth year], LOS: 0 ADMISSION DATE:  08/18/2019, CONSULTATION DATE:  08/18/19  REFERRING MD:  Dr. Chaney Malling, CHIEF COMPLAINT:  sepsis   Brief History   86 year old presented to ED for abdominal pain, melena, hypotension  History of present illness   86 year old female hx chf, diverticulosis presents with abdominal pain for a few days, melena for last day.  She resides at [facility].  She was noted to have several episodes of brbpr today.  Arrived to ed with bp 74/55.  Given 2L fluid, cipro, flagyl.  Ct renal consistent with enteritis.  Ct head no intracranial abnormalities.  Hb 11, lactate 1.3.    Recent hospital admission 07/24/19 for UTI.  D/c home on cephalexin   Right femur fracture s/p ORIF 06/26/19  Past Medical History  Diverticulosis htn afib Dementia   Significant Hospital Events     Consults:  GI, critical care, hospitalist   Procedures:    Significant Diagnostic Tests:  Lactate 1.3  Ct renal- enteritis   Micro Data:  ucx- pending bcx- pending   Antimicrobials:  cipro 08/18/19- Flagyl 08/18/19-    Interim history/subjective:    Objective   Blood pressure (!) 113/58, pulse (!) 58, temperature 97.6 F (36.4 C), temperature source Oral, resp. rate 14, height 5\' 6"  (1.676 m), weight 63.5 kg, SpO2 100 %.        Intake/Output Summary (Last 24 hours) at 08/18/2019 2143 Last data filed at 08/18/2019 2123 Gross per 24 hour  Intake 1500 ml  Output --  Net 1500 ml   Filed Weights   08/18/19 1504  Weight: 63.5 kg    Examination: General: nad, alert and oriented HENT: dry mucous membranes  Lungs: cta  Cardiovascular: rrr Abdomen: soft, epigastric tenderness, bs+ Extremities: warm, no le edema Neuro: non focal  GU:   Resolved Hospital Problem list     Assessment & Plan:  86 year old female with recent admission for femur fracture, UTI in 3/21 presents with abdominal pain,  melana,enteritis.    Sepsis/hypovolemia- originally hypotensive. Baseline bp in chart around 110/60.  She received 2L in ed with bp around 110/70.  Lactate 1.3.  She received abx cipro, flagyl for enteritis.  She also had BRBPR .  Hb 11.  - continue abx per GI - pending possible egd, colonoscopy for melana/brbpr and previously on asa 325 bid since orthopedic surgery 06/26/19 - will recommend one more liter bolus given patient is dry by exam  GI bleed - would keep npo for possible procedure in morning  - hold asa  - await GI recs  - trend hb q6hours    aki- likely secondary to hypovolemia/sepsis.  Received 2L fluid since.   - would recommend another liter iv fluids  - monitor urine output   Best practice:  Diet: NPO Pain/Anxiety/Delirium protocol (if indicated):  VAP protocol (if indicated):  DVT prophylaxis: recommend scd GI prophylaxis: recommend PPI bid Glucose control: SSI Mobility:  Code Status: full code Family Communication: discussed patient with niece.  She states patient wishes for intubation, pressors if medical problems are obviously reversible.   Disposition: recommend med-tele  Labs   CBC: Recent Labs  Lab 08/18/19 1645  WBC 13.0*  NEUTROABS 10.8*  HGB 11.1*  HCT 34.2*  MCV 88.4  PLT 227    Basic Metabolic Panel: Recent Labs  Lab 08/18/19 1645  NA 137  K 4.4  CL 108  CO2 21*  GLUCOSE 105*  BUN 119*  CREATININE 2.97*  CALCIUM 8.3*   GFR: Estimated Creatinine Clearance: 11.3 mL/min (A) (by C-G formula based on SCr of 2.97 mg/dL (H)). Recent Labs  Lab 08/18/19 1645 08/18/19 1904  WBC 13.0*  --   LATICACIDVEN  --  1.3    Liver Function Tests: Recent Labs  Lab 08/18/19 1645  AST 31  ALT 25  ALKPHOS 88  BILITOT 0.7  PROT 4.5*  ALBUMIN 2.3*   No results for input(s): LIPASE, AMYLASE in the last 168 hours. No results for input(s): AMMONIA in the last 168 hours.  ABG No results found for: PHART, PCO2ART, PO2ART, HCO3, TCO2,  ACIDBASEDEF, O2SAT   Coagulation Profile: No results for input(s): INR, PROTIME in the last 168 hours.  Cardiac Enzymes: No results for input(s): CKTOTAL, CKMB, CKMBINDEX, TROPONINI in the last 168 hours.  HbA1C: No results found for: HGBA1C  CBG: No results for input(s): GLUCAP in the last 168 hours.  Review of Systems:     Past Medical History  She,  has a past medical history of Abdominal pain, Atrophic vaginitis, Cellulitis (06/2019), CHF (congestive heart failure) (HCC), Colon polyps, Diverticulosis, Essential hypertension, benign, Gastritis, Hiatal hernia, Insomnia, idiopathic, Malaise and fatigue, Osteoarthrosis and allied disorders, Other and unspecified hyperlipidemia, and Thoracic scoliosis.   Surgical History    Past Surgical History:  Procedure Laterality Date  . JOINT REPLACEMENT    . left shoulder surgery     . rt knee   10 1 08   Dr Thomasena Edis  . Rt rotator  cuff repair  4 1 00  . TOTAL HIP ARTHROPLASTY Right 2021  ?     Social History   reports that she has quit smoking. Her smoking use included cigarettes. She has a 0.50 pack-year smoking history. She has never used smokeless tobacco. She reports that she does not drink alcohol or use drugs.   Family History   Her family history includes Cancer in her sister; Heart disease (age of onset: 28) in her father.   Allergies Allergies  Allergen Reactions  . Ace Inhibitors Cough    "Allergic," per MAR  . Simvastatin Other (See Comments)    Leg pain- "Allergic," per [facility]     Home Medications  Prior to Admission medications   Medication Sig Start Date End Date Taking? Authorizing Provider  acetaminophen (TYLENOL) 325 MG tablet Take 2 tablets (650 mg total) by mouth every 6 (six) hours as needed for mild pain (or Fever >/= 101). 07/24/19   Danford, Earl Lites, MD  albuterol (PROAIR HFA) 108 (90 Base) MCG/ACT inhaler Inhale 2 puffs into the lungs every 6 (six) hours as needed. Patient taking differently: Inhale  2 puffs into the lungs every 4 (four) hours as needed for wheezing or shortness of breath.  06/19/18   Ernestina Penna, MD  amiodarone (PACERONE) 200 MG tablet TAKE (1/2) TABLET DAILY. Patient taking differently: Take 200 mg by mouth in the morning.  06/19/18   Ernestina Penna, MD  ARIPiprazole (ABILIFY) 5 MG tablet Take 5 mg by mouth daily.    [provider]  budesonide-formoterol (SYMBICORT) 160-4.5 MCG/ACT inhaler Inhale 2 puffs into the lungs 2 (two) times daily.    [provider]  busPIRone (BUSPAR) 5 MG tablet Take 1 tablet (5 mg total) by mouth at bedtime. 06/19/18   Ernestina Penna, MD  donepezil (ARICEPT) 10 MG tablet Take 1 tablet (10 mg total) by mouth daily. Patient  taking differently: Take 10 mg by mouth at bedtime.  06/19/18   Chipper Herb, MD  furosemide (LASIX) 20 MG tablet TAKE 2 TABLET A DAY AS DIRECTED. Patient taking differently: Take 20 mg by mouth in the morning.  06/19/18   Chipper Herb, MD  levothyroxine (SYNTHROID) 50 MCG tablet Take 50 mcg by mouth daily before breakfast.    [provider]  losartan (COZAAR) 100 MG tablet Take 100 mg by mouth in the morning.    [provider]  memantine (NAMENDA) 10 MG tablet TAKE (1) TABLET TWICE A DAY. Patient taking differently: Take 10 mg by mouth 2 (two) times daily.  06/19/18   Chipper Herb, MD  metoprolol succinate (TOPROL-XL) 25 MG 24 hr tablet Take 1 tablet (25 mg total) by mouth daily. Patient taking differently: Take 25 mg by mouth in the morning.  06/19/18   Chipper Herb, MD  omeprazole (PRILOSEC OTC) 20 MG tablet Take 20 mg by mouth in the morning.    [provider]  oxyCODONE (OXY IR/ROXICODONE) 5 MG immediate release tablet Take 1 tablet (5 mg total) by mouth every 6 (six) hours as needed (for pain management). 07/24/19   Danford, Suann Larry, MD  potassium chloride SA (K-DUR,KLOR-CON) 20 MEQ tablet Take 1 tablet (20 mEq total) by mouth daily. 06/19/18   Chipper Herb,  MD  senna (SENOKOT) 8.6 MG TABS tablet Take 2 tablets by mouth 2 (two) times daily.    [provider]  Spacer/Aero-Holding Dorise Bullion Use with symbicort inhaler BID 06/19/18   Chipper Herb, MD     Critical care time: 35 minutes

## 2019-08-18 NOTE — ED Notes (Signed)
ED Provider at bedside. 

## 2019-08-18 NOTE — ED Notes (Signed)
RN attempted to draw blood off of IV line and straight stick and was unsuccessful. RN made ED phlebotomy aware.

## 2019-08-18 NOTE — ED Triage Notes (Signed)
Pt here from [facility] with co fatigue, bilateral flank pain, and abd pain x2days. Pts niece noticed black tarrry stool today. EMS reported pts mouth looked dry and pt was hypotensive 74/50 they gave 500cc bolus and her systolic improved to 120.

## 2019-08-18 NOTE — ED Provider Notes (Signed)
[facility] EMERGENCY DEPARTMENT Provider Note   CSN: [id number] Arrival date & time: 08/18/19  1453     History Chief Complaint  Patient presents with  . Fatigue  . Hypotension    [name] is a 86 y.o. female hx of CHF, diverticulosis, here presenting with hypotension, fatigue, flank pain.  Patient recently had a right knee replacement but is not on any blood thinners.  Patient is now at [facility].  Patient has been having poor appetite and has been on Lasix for diuresis.  She also has some mild diffuse abdominal pain as well.  Patient also had several episodes of melena for several days.  Today she had several episodes of bright red blood per rectum.  The nursing home has been monitoring her chemistry.  Her labs showed creatinine went from 1 to 3 over the last week and BUN went up to around 100. EMS was called because she appears more altered and tired and was noted to be hypotensive 74/55 Patient has no fevers at home.  History provided by the patient and her daughter.  The history is provided by the patient, a relative and the EMS personnel.       Past Medical History:  Diagnosis Date  . Abdominal pain   . Atrophic vaginitis   . Cellulitis 06/2019  . CHF (congestive heart failure) (HCC)   . Colon polyps    Dr Ewing Schlein  . Diverticulosis    Dr Ewing Schlein  . Essential hypertension, benign   . Gastritis   . Hiatal hernia   . Insomnia, idiopathic   . Malaise and fatigue   . Osteoarthrosis and allied disorders   . Other and unspecified hyperlipidemia   . Thoracic scoliosis     Patient Active Problem List   Diagnosis Date Noted  . Cellulitis of right lower leg 07/23/2019  . Sepsis (HCC) 07/22/2019  . Chronic kidney disease (CKD), stage III (moderate) 12/18/2018  . Aortic atherosclerosis (HCC) 12/19/2017  . Bronchiectasis without complication (HCC) 12/19/2017  . Hypothyroidism 03/31/2015  . CHF (congestive heart failure) (HCC)  11/15/2014  . Absolute anemia 10/18/2014  . Bruising, spontaneous 09/16/2014  . Long term current use of anticoagulant 09/16/2014  . Atrial fibrillation (HCC) 04/15/2014  . Edema 04/15/2014  . Nodule of right lung 05/27/2013  . Memory impairment 03/12/2013  . Prophylactic immunotherapy 10/24/2012  . Dyspnea 07/06/2012  . Extrinsic asthma 08/31/2010  . Atrophic vaginitis   . Malaise and fatigue   . Thoracic scoliosis   . Insomnia, idiopathic   . Gastritis   . Gastroesophageal reflux disease with hiatal hernia   . Colon polyps   . Diverticulosis   . Osteoarthrosis and allied disorders   . Hyperlipidemia 02/11/2009  . Essential hypertension 02/10/2009    Past Surgical History:  Procedure Laterality Date  . JOINT REPLACEMENT    . left shoulder surgery     . rt knee   10 1 08   Dr Thomasena Edis  . Rt rotator  cuff repair  4 1 00  . TOTAL HIP ARTHROPLASTY Right 2021  ?     OB History   No obstetric history on file.     Family History  Problem Relation Age of Onset  . Heart disease Father 49  . Cancer Sister     Social History   Tobacco Use  . Smoking status: Former Smoker    Packs/day: 0.10    Years: 5.00    Pack years:  0.50    Types: Cigarettes  . Smokeless tobacco: Never Used  . Tobacco comment: pt states only smoke socially in teens  Substance Use Topics  . Alcohol use: No  . Drug use: No    Home Medications Prior to Admission medications   Medication Sig Start Date End Date Taking? Authorizing Provider  acetaminophen (TYLENOL) 325 MG tablet Take 2 tablets (650 mg total) by mouth every 6 (six) hours as needed for mild pain (or Fever >/= 101). 07/24/19   Danford, Earl Lites, MD  albuterol (PROAIR HFA) 108 (90 Base) MCG/ACT inhaler Inhale 2 puffs into the lungs every 6 (six) hours as needed. Patient taking differently: Inhale 2 puffs into the lungs every 4 (four) hours as needed for wheezing or shortness of breath.  06/19/18   Ernestina Penna, MD  amiodarone  (PACERONE) 200 MG tablet TAKE (1/2) TABLET DAILY. Patient taking differently: Take 200 mg by mouth in the morning.  06/19/18   Ernestina Penna, MD  ARIPiprazole (ABILIFY) 5 MG tablet Take 5 mg by mouth daily.    [provider]  budesonide-formoterol (SYMBICORT) 160-4.5 MCG/ACT inhaler Inhale 2 puffs into the lungs 2 (two) times daily.    [provider]  busPIRone (BUSPAR) 5 MG tablet Take 1 tablet (5 mg total) by mouth at bedtime. 06/19/18   Ernestina Penna, MD  donepezil (ARICEPT) 10 MG tablet Take 1 tablet (10 mg total) by mouth daily. Patient taking differently: Take 10 mg by mouth at bedtime.  06/19/18   Ernestina Penna, MD  furosemide (LASIX) 20 MG tablet TAKE 2 TABLET A DAY AS DIRECTED. Patient taking differently: Take 20 mg by mouth in the morning.  06/19/18   Ernestina Penna, MD  levothyroxine (SYNTHROID) 50 MCG tablet Take 50 mcg by mouth daily before breakfast.    [provider]  losartan (COZAAR) 100 MG tablet Take 100 mg by mouth in the morning.    [provider]  memantine (NAMENDA) 10 MG tablet TAKE (1) TABLET TWICE A DAY. Patient taking differently: Take 10 mg by mouth 2 (two) times daily.  06/19/18   Ernestina Penna, MD  metoprolol succinate (TOPROL-XL) 25 MG 24 hr tablet Take 1 tablet (25 mg total) by mouth daily. Patient taking differently: Take 25 mg by mouth in the morning.  06/19/18   Ernestina Penna, MD  omeprazole (PRILOSEC OTC) 20 MG tablet Take 20 mg by mouth in the morning.    [provider]  oxyCODONE (OXY IR/ROXICODONE) 5 MG immediate release tablet Take 1 tablet (5 mg total) by mouth every 6 (six) hours as needed (for pain management). 07/24/19   Danford, Earl Lites, MD  potassium chloride SA (K-DUR,KLOR-CON) 20 MEQ tablet Take 1 tablet (20 mEq total) by mouth daily. 06/19/18   Ernestina Penna, MD  senna (SENOKOT) 8.6 MG TABS tablet Take 2 tablets by mouth 2 (two) times daily.    [provider]  Spacer/Aero-Holding  Rudean Curt Use with symbicort inhaler BID 06/19/18   Ernestina Penna, MD    Allergies    Ace inhibitors and Simvastatin  Review of Systems   Review of Systems  Gastrointestinal: Positive for blood in stool.  Neurological: Positive for weakness.  All other systems reviewed and are negative.   Physical Exam Updated Vital Signs BP (!) 109/48   Pulse (!) 58   Temp 97.6 F (36.4 C) (Oral)   Resp 15   Ht 5\' 6"  (1.676 m)  Wt 63.5 kg   SpO2 99%   BMI 22.60 kg/m   Physical Exam Vitals and nursing note reviewed.  HENT:     Head: Normocephalic.     Nose: Nose normal.     Mouth/Throat:     Mouth: Mucous membranes are dry.  Eyes:     Extraocular Movements: Extraocular movements intact.     Pupils: Pupils are equal, round, and reactive to light.  Cardiovascular:     Rate and Rhythm: Normal rate and regular rhythm.     Pulses: Normal pulses.     Heart sounds: Normal heart sounds.  Pulmonary:     Effort: Pulmonary effort is normal.     Breath sounds: Normal breath sounds.  Abdominal:     General: Abdomen is flat.     Comments: + epigastric tenderness   Genitourinary:    Comments: Rectal- melena, no hemorrhoids, guiac positive  Musculoskeletal:        General: Normal range of motion.     Cervical back: Normal range of motion.  Skin:    General: Skin is warm.     Capillary Refill: Capillary refill takes less than 2 seconds.  Neurological:     General: No focal deficit present.     Mental Status: She is alert.     Comments: Demented, A & O x 2, moving all extremities, tired   Psychiatric:        Mood and Affect: Mood normal.     ED Results / Procedures / Treatments   Labs (all labs ordered are listed, but only abnormal results are displayed) Labs Reviewed  CBC WITH DIFFERENTIAL/PLATELET - Abnormal; Notable for the following components:      Result Value   WBC 13.0 (*)    Hemoglobin 11.1 (*)    HCT 34.2 (*)    RDW 16.2 (*)    Neutro Abs 10.8 (*)    All other  components within normal limits  COMPREHENSIVE METABOLIC PANEL - Abnormal; Notable for the following components:   CO2 21 (*)    Glucose, Bld 105 (*)    BUN 119 (*)    Creatinine, Ser 2.97 (*)    Calcium 8.3 (*)    Total Protein 4.5 (*)    Albumin 2.3 (*)    GFR calc non Af Amer 13 (*)    GFR calc Af Amer 15 (*)    All other components within normal limits  POC OCCULT BLOOD, ED - Abnormal; Notable for the following components:   Fecal Occult Bld POSITIVE (*)    All other components within normal limits  URINE CULTURE  CULTURE, BLOOD (ROUTINE X 2)  CULTURE, BLOOD (ROUTINE X 2)  URINALYSIS, ROUTINE W REFLEX MICROSCOPIC  LACTIC ACID, PLASMA  LACTIC ACID, PLASMA  TYPE AND SCREEN  ABO/RH    EKG EKG Interpretation  Date/Time:  Saturday August 18 2019 14:58:10 EDT Ventricular Rate:  55 PR Interval:    QRS Duration: 85 QT Interval:  469 QTC Calculation: 449 R Axis:   58 Text Interpretation: Sinus rhythm Low voltage, precordial leads Borderline T abnormalities, anterior leads No significant change since last tracing Confirmed by Wandra Arthurs [phone]) on 08/18/2019 3:19:10 PM   Radiology CT Head Wo Contrast  Result Date: 08/18/2019 CLINICAL DATA:  Fatigue, hypotension, cerebral hemorrhage suspected EXAM: CT HEAD WITHOUT CONTRAST TECHNIQUE: Contiguous axial images were obtained from the base of the skull through the vertex without intravenous contrast. COMPARISON:  07/22/2019 FINDINGS: Brain: No evidence of acute  infarction, hemorrhage, hydrocephalus, extra-axial collection or mass lesion/mass effect. Periventricular white matter hypodensity and global volume loss. Mega cisterna magna variant of the posterior fossa. Vascular: No hyperdense vessel or unexpected calcification. Skull: Normal. Negative for fracture or focal lesion. Sinuses/Orbits: No acute finding. Other: None. IMPRESSION: No acute intracranial pathology.  Small-vessel white matter disease. Electronically Signed   By: Lauralyn Primes M.D.   On: 08/18/2019 17:58   DG Chest Port 1 View  Result Date: 08/18/2019 CLINICAL DATA:  86 year old female with weakness, hypotension. Evaluate for pneumonia. EXAM: PORTABLE CHEST 1 VIEW COMPARISON:  Prior chest x-ray 07/22/2019 FINDINGS: Stable cardiac and mediastinal contours. Atherosclerotic calcifications present throughout the aorta. No focal airspace opacity to suggest pneumonia. Chronic bronchitic changes remain stable. Minimal right apical pleuroparenchymal scarring. Evidence of prior bilateral rotator cuff repair. No acute osseous abnormality. IMPRESSION: No active disease. Electronically Signed   By: Malachy Moan M.D.   On: 08/18/2019 15:29   CT Renal Stone Study  Result Date: 08/18/2019 CLINICAL DATA:  Pro flank pain, hypotension EXAM: CT ABDOMEN AND PELVIS WITHOUT CONTRAST TECHNIQUE: Multidetector CT imaging of the abdomen and pelvis was performed following the standard protocol without IV contrast. COMPARISON:  None. FINDINGS: Lower chest: No acute abnormality.  Coronary artery calcifications. Hepatobiliary: No solid liver abnormality is seen. No gallstones, gallbladder wall thickening, or biliary dilatation. Pancreas: Unremarkable. No pancreatic ductal dilatation or surrounding inflammatory changes. Spleen: Normal in size without significant abnormality. Adrenals/Urinary Tract: Adrenal glands are unremarkable. Kidneys are normal, without renal calculi, solid lesion, or hydronephrosis. Bladder is unremarkable. Stomach/Bowel: Stomach is within normal limits. Appendix appears normal. There is inflammatory stranding about the transverse and ascending portions of the duodenum (series 3, image 30). Sigmoid diverticulosis. Vascular/Lymphatic: Aortic atherosclerosis. No enlarged abdominal or pelvic lymph nodes. Reproductive: No mass or other significant abnormality. Other: No abdominal wall hernia or abnormality. No abdominopelvic ascites. Musculoskeletal: No acute or significant  osseous findings. Status post right hip total arthroplasty. IMPRESSION: 1. Inflammatory stranding about the transverse and ascending portions of the duodenum, consistent with infectious or inflammatory enteritis. 2.  No evidence of urinary tract calculus or hydronephrosis. 3.  Coronary artery disease.  Aortic Atherosclerosis (ICD10-I70.0). Electronically Signed   By: Lauralyn Primes M.D.   On: 08/18/2019 18:03    Procedures Procedures (including critical care time)   CRITICAL CARE Performed by: [name]   Total critical care time: 30 minutes  Critical care time was exclusive of separately billable procedures and treating other patients.  Critical care was necessary to treat or prevent imminent or life-threatening deterioration.  Critical care was time spent personally by me on the following activities: development of treatment plan with patient and/or surrogate as well as nursing, discussions with consultants, evaluation of patient's response to treatment, examination of patient, obtaining history from patient or surrogate, ordering and performing treatments and interventions, ordering and review of laboratory studies, ordering and review of radiographic studies, pulse oximetry and re-evaluation of patient's condition.  Angiocath insertion Performed by: [name]  Consent: Verbal consent obtained. Risks and benefits: risks, benefits and alternatives were discussed Time out: Immediately prior to procedure a "time out" was called to verify the correct patient, procedure, equipment, support staff and site/side marked as required.  Preparation: Patient was prepped and draped in the usual sterile fashion.  Vein Location: L antecube  Ultrasound Guided  Gauge: 20 long  Normal blood return and flush without difficulty Patient tolerance: Patient tolerated the procedure well with no immediate complications.  Medications Ordered in ED Medications  ciprofloxacin (CIPRO) IVPB 400 mg  (has no administration in time range)  metroNIDAZOLE (FLAGYL) IVPB 500 mg (has no administration in time range)  sodium chloride 0.9 % bolus 500 mL (0 mLs Intravenous Stopped 08/18/19 1652)  sodium chloride 0.9 % bolus 1,000 mL (1,000 mLs Intravenous New Bag/Given 08/18/19 1828)  pantoprazole (PROTONIX) injection 40 mg (40 mg Intravenous Given 08/18/19 1828)    ED Course  I have reviewed the triage vital signs and the nursing notes.  Pertinent labs & imaging results that were available during my care of the patient were reviewed by me and considered in my medical decision making (see chart for details).    MDM Rules/Calculators/A&P                      Casper Harrisonhelma L Dubose is a 86 y.o. female presenting with weakness, hypotension, elevated BUN and creatinine, low blood per rectum.  Differential is fairly broad including upper GI bleed, overdiuresis, dehydration, sepsis.  Plan to get CBC, CMP, chest x-ray and urinalysis and CT head and CT renal stone.  Will hydrate patient and start on Protonix and likely will need admission.  7:12 PM BP improved to 109/48.  Patient's WBC is 13.  Her CT showed inflammatory versus infectious duodenitis.  Blood pressure improved to low 100s after IV bolus.  Her BUN and creatinine is elevated as well.  Consulted GI (Dr. Chales AbrahamsGupta) for possible GI bleed.  Lactate, cultures sent. Hospitalist to admit for duodenitis, sepsis, possible GI bleed.  9 pm Patient dropped her pressure to 80s briefly after the bolus.  I talked to critical care who evaluated the patient.  Her blood pressure went up to 113/50 right now.  They felt that she does not to be on pressors right now and that she can be admitted to stepdown.  They will follow as needed.  Final Clinical Impression(s) / ED Diagnoses Final diagnoses:  None    Rx / DC Orders ED Discharge Orders    None       Charlynne PanderYao, Sabrina Louvier Hsienta, MD 08/18/19 2144

## 2019-08-18 NOTE — H&P (Signed)
History and Physical    [name] BMW:[id number] DOB: 1924-06-22 DOA: 08/18/2019  PCP: Raliegh Ip, DO Patient coming from: Nursing home  Chief Complaint: Melena, hypotension  HPI: [name] is a 86 y.o. female with medical history significant of dementia, CHF, colon polyps, diverticulosis, gastritis, hiatal hernia, hypertension, hyperlipidemia, paroxysmal A. fib not on anticoagulation, distal femur fracture status post ORIF in March 2021 here from her nursing home for evaluation of melen and hypotension.  It is reported that patient is coming from [facility] nursing home.  She has been having a poor appetite and has been on Lasix for diuresis.  She also has some mild diffuse abdominal pain.  She has had several episodes of melena for several days.   Labs done at the nursing home showed creatinine going from 1 to 3 over the last week and BUN up to around 100.  EMS was called because patient appeared more altered and tired.  She was noted to be hypotensive with blood pressure 74/55.  No fevers reported.  History provided by patient and niece at bedside.  Patient has not been doing well for the past 1.5 weeks.  She has been complaining of upper and right-sided abdominal pain.  She has had several episodes of nonbloody emesis.  In addition, has had several episodes of dark tarry stools.  Per niece, patient has not had any bright red blood per rectum.  She is currently not on any anticoagulation and was previously on aspirin after her knee surgery.  ED Course: Afebrile.  Hypotensive with systolic as low as 70s to 80s.  Labs showing mild leukocytosis with WBC count 13.  Lactic acid normal.  FOBT positive.  Melanotic stool on exam.  Hemoglobin 11.1, stable compared to prior labs.  Platelet count normal.  BUN 119, creatinine 2.9.  Baseline creatinine 1.0.  LFTs normal.  UA with negative nitrite, small amount of leukocytes, 6-10 WBCs, and rare bacteria.  Urine culture pending.   Blood cultures pending.  Chest x-ray showing no active disease.  CT renal stone study showing inflammatory stranding about the transverse and ascending portions of the duodenum, consistent with infectious or inflammatory enteritis.  No evidence of urinary tract calculus or hydronephrosis.  CT head negative for acute intracranial abnormality.  Patient was given IV Protonix 40 mg, Cipro, Flagyl, and 2.5 L normal saline boluses.   Review of Systems:  All systems reviewed and apart from history of presenting illness, are negative.  Past Medical History:  Diagnosis Date  . Abdominal pain   . Atrophic vaginitis   . Cellulitis 06/2019  . CHF (congestive heart failure) (HCC)   . Colon polyps    Dr Ewing Schlein  . Diverticulosis    Dr Ewing Schlein  . Essential hypertension, benign   . Gastritis   . Hiatal hernia   . Insomnia, idiopathic   . Malaise and fatigue   . Osteoarthrosis and allied disorders   . Other and unspecified hyperlipidemia   . Thoracic scoliosis     Past Surgical History:  Procedure Laterality Date  . JOINT REPLACEMENT    . left shoulder surgery     . rt knee   10 1 08   Dr Thomasena Edis  . Rt rotator  cuff repair  4 1 00  . TOTAL HIP ARTHROPLASTY Right 2021  ?     reports that she has quit smoking. Her smoking use included cigarettes. She has a 0.50 pack-year smoking history. She has never used smokeless  tobacco. She reports that she does not drink alcohol or use drugs.  Allergies  Allergen Reactions  . Simvastatin Other (See Comments)    Leg pain- "Allergic," per MAR  . Ace Inhibitors Cough    "Allergic," per [facility]    Family History  Problem Relation Age of Onset  . Heart disease Father 28  . Cancer Sister     Prior to Admission medications   Medication Sig Start Date End Date Taking? Authorizing Provider  acetaminophen (TYLENOL) 500 MG tablet Take 500 mg by mouth every 4 (four) hours as needed for mild pain.   Yes [provider]  albuterol (PROAIR HFA) 108  (90 Base) MCG/ACT inhaler Inhale 2 puffs into the lungs every 6 (six) hours as needed. Patient taking differently: Inhale 2 puffs into the lungs every 6 (six) hours as needed for wheezing or shortness of breath.  06/19/18  Yes Chipper Herb, MD  amiodarone (PACERONE) 100 MG tablet Take 100 mg by mouth daily.   Yes [provider]  ARIPiprazole (ABILIFY) 5 MG tablet Take 5 mg by mouth daily.   Yes [provider]  budesonide-formoterol (SYMBICORT) 160-4.5 MCG/ACT inhaler Inhale 2 puffs into the lungs 2 (two) times daily.   Yes [provider]  busPIRone (BUSPAR) 5 MG tablet Take 1 tablet (5 mg total) by mouth at bedtime. 06/19/18  Yes Chipper Herb, MD  donepezil (ARICEPT) 10 MG tablet Take 1 tablet (10 mg total) by mouth daily. Patient taking differently: Take 10 mg by mouth at bedtime.  06/19/18  Yes Chipper Herb, MD  furosemide (LASIX) 20 MG tablet TAKE 2 TABLET A DAY AS DIRECTED. Patient taking differently: Take 20 mg by mouth in the morning.  06/19/18  Yes Chipper Herb, MD  levothyroxine (SYNTHROID) 50 MCG tablet Take 50 mcg by mouth daily before breakfast.   Yes [provider]  losartan (COZAAR) 100 MG tablet Take 100 mg by mouth in the morning.   Yes [provider]  memantine (NAMENDA) 10 MG tablet TAKE (1) TABLET TWICE A DAY. Patient taking differently: Take 10 mg by mouth 2 (two) times daily.  06/19/18  Yes Chipper Herb, MD  metoprolol succinate (TOPROL-XL) 25 MG 24 hr tablet Take 1 tablet (25 mg total) by mouth daily. Patient taking differently: Take 25 mg by mouth in the morning.  06/19/18  Yes Chipper Herb, MD  nystatin cream (MYCOSTATIN) Apply 1 application topically See admin instructions. Apply to buttocks for 14 days 08/09/19 08/22/19 Yes [provider]  oxyCODONE (OXY IR/ROXICODONE) 5 MG immediate release tablet Take 1 tablet (5 mg total) by mouth every 6 (six) hours as needed (for pain management). 07/24/19  Yes  Danford, Suann Larry, MD  potassium chloride SA (K-DUR,KLOR-CON) 20 MEQ tablet Take 1 tablet (20 mEq total) by mouth daily. 06/19/18  Yes Chipper Herb, MD  senna (SENOKOT) 8.6 MG TABS tablet Take 1 tablet by mouth 2 (two) times daily.    Yes [provider]  acetaminophen (TYLENOL) 325 MG tablet Take 2 tablets (650 mg total) by mouth every 6 (six) hours as needed for mild pain (or Fever >/= 101). Patient not taking: Reported on 08/18/2019 07/24/19   Edwin Dada, MD  amiodarone (PACERONE) 200 MG tablet TAKE (1/2) TABLET DAILY. Patient not taking: Reported on 08/18/2019 06/19/18   Chipper Herb, MD  Spacer/Aero-Holding Dorise Bullion Use with symbicort inhaler BID 06/19/18   Chipper Herb, MD  Physical Exam: Vitals:   08/18/19 2207 08/18/19 2210 08/18/19 2230 08/18/19 2300  BP: (!) 108/50 (!) 123/51 (!) 92/50 (!) 88/49  Pulse: (!) 58 (!) 58 (!) 57 (!) 59  Resp: 15 14 13 10   Temp:      TempSrc:      SpO2: 100% 99% 98% 100%  Weight:      Height:        Physical Exam  Constitutional: No distress.  Frail elderly female  HENT:  Head: Normocephalic.  Dry mucous membranes Oral thrush  Eyes: Right eye exhibits no discharge. Left eye exhibits no discharge.  Cardiovascular: Normal rate, regular rhythm and intact distal pulses.  Pulmonary/Chest: Effort normal and breath sounds normal. No respiratory distress. She has no wheezes. She has no rales.  Abdominal: Soft. She exhibits no distension. There is no abdominal tenderness. There is no guarding.  Hyperactive bowel sounds  Musculoskeletal:        General: No edema.     Cervical back: Neck supple.  Neurological:  Awake and alert  Skin: Skin is warm and dry. She is not diaphoretic.     Labs on Admission: I have personally reviewed following labs and imaging studies  CBC: Recent Labs  Lab 08/18/19 1645  WBC 13.0*  NEUTROABS 10.8*  HGB 11.1*  HCT 34.2*  MCV 88.4  PLT 227   Basic Metabolic Panel: Recent  Labs  Lab 08/18/19 1645  NA 137  K 4.4  CL 108  CO2 21*  GLUCOSE 105*  BUN 119*  CREATININE 2.97*  CALCIUM 8.3*   GFR: Estimated Creatinine Clearance: 11.3 mL/min (A) (by C-G formula based on SCr of 2.97 mg/dL (H)). Liver Function Tests: Recent Labs  Lab 08/18/19 1645  AST 31  ALT 25  ALKPHOS 88  BILITOT 0.7  PROT 4.5*  ALBUMIN 2.3*   No results for input(s): LIPASE, AMYLASE in the last 168 hours. No results for input(s): AMMONIA in the last 168 hours. Coagulation Profile: No results for input(s): INR, PROTIME in the last 168 hours. Cardiac Enzymes: No results for input(s): CKTOTAL, CKMB, CKMBINDEX, TROPONINI in the last 168 hours. BNP (last 3 results) No results for input(s): PROBNP in the last 8760 hours. HbA1C: No results for input(s): HGBA1C in the last 72 hours. CBG: No results for input(s): GLUCAP in the last 168 hours. Lipid Profile: No results for input(s): CHOL, HDL, LDLCALC, TRIG, CHOLHDL, LDLDIRECT in the last 72 hours. Thyroid Function Tests: No results for input(s): TSH, T4TOTAL, FREET4, T3FREE, THYROIDAB in the last 72 hours. Anemia Panel: No results for input(s): VITAMINB12, FOLATE, FERRITIN, TIBC, IRON, RETICCTPCT in the last 72 hours. Urine analysis:    Component Value Date/Time   COLORURINE YELLOW 08/18/2019 1905   APPEARANCEUR CLEAR 08/18/2019 1905   LABSPEC 1.015 08/18/2019 1905   PHURINE 5.0 08/18/2019 1905   GLUCOSEU NEGATIVE 08/18/2019 1905   HGBUR NEGATIVE 08/18/2019 1905   BILIRUBINUR NEGATIVE 08/18/2019 1905   BILIRUBINUR NEG 01/09/2014 1041   KETONESUR NEGATIVE 08/18/2019 1905   PROTEINUR NEGATIVE 08/18/2019 1905   UROBILINOGEN negative 01/09/2014 1041   UROBILINOGEN 0.2 01/23/2007 0836   NITRITE NEGATIVE 08/18/2019 1905   LEUKOCYTESUR SMALL (A) 08/18/2019 1905    Radiological Exams on Admission: CT Head Wo Contrast  Result Date: 08/18/2019 CLINICAL DATA:  Fatigue, hypotension, cerebral hemorrhage suspected EXAM: CT HEAD  WITHOUT CONTRAST TECHNIQUE: Contiguous axial images were obtained from the base of the skull through the vertex without intravenous contrast. COMPARISON:  07/22/2019 FINDINGS: Brain: No evidence  of acute infarction, hemorrhage, hydrocephalus, extra-axial collection or mass lesion/mass effect. Periventricular white matter hypodensity and global volume loss. Mega cisterna magna variant of the posterior fossa. Vascular: No hyperdense vessel or unexpected calcification. Skull: Normal. Negative for fracture or focal lesion. Sinuses/Orbits: No acute finding. Other: None. IMPRESSION: No acute intracranial pathology.  Small-vessel white matter disease. Electronically Signed   By: Lauralyn PrimesAlex  Bibbey M.D.   On: 08/18/2019 17:58   DG Chest Port 1 View  Result Date: 08/18/2019 CLINICAL DATA:  86 year old female with weakness, hypotension. Evaluate for pneumonia. EXAM: PORTABLE CHEST 1 VIEW COMPARISON:  Prior chest x-ray 07/22/2019 FINDINGS: Stable cardiac and mediastinal contours. Atherosclerotic calcifications present throughout the aorta. No focal airspace opacity to suggest pneumonia. Chronic bronchitic changes remain stable. Minimal right apical pleuroparenchymal scarring. Evidence of prior bilateral rotator cuff repair. No acute osseous abnormality. IMPRESSION: No active disease. Electronically Signed   By: Malachy MoanHeath  McCullough M.D.   On: 08/18/2019 15:29   CT Renal Stone Study  Result Date: 08/18/2019 CLINICAL DATA:  Pro flank pain, hypotension EXAM: CT ABDOMEN AND PELVIS WITHOUT CONTRAST TECHNIQUE: Multidetector CT imaging of the abdomen and pelvis was performed following the standard protocol without IV contrast. COMPARISON:  None. FINDINGS: Lower chest: No acute abnormality.  Coronary artery calcifications. Hepatobiliary: No solid liver abnormality is seen. No gallstones, gallbladder wall thickening, or biliary dilatation. Pancreas: Unremarkable. No pancreatic ductal dilatation or surrounding inflammatory changes.  Spleen: Normal in size without significant abnormality. Adrenals/Urinary Tract: Adrenal glands are unremarkable. Kidneys are normal, without renal calculi, solid lesion, or hydronephrosis. Bladder is unremarkable. Stomach/Bowel: Stomach is within normal limits. Appendix appears normal. There is inflammatory stranding about the transverse and ascending portions of the duodenum (series 3, image 30). Sigmoid diverticulosis. Vascular/Lymphatic: Aortic atherosclerosis. No enlarged abdominal or pelvic lymph nodes. Reproductive: No mass or other significant abnormality. Other: No abdominal wall hernia or abnormality. No abdominopelvic ascites. Musculoskeletal: No acute or significant osseous findings. Status post right hip total arthroplasty. IMPRESSION: 1. Inflammatory stranding about the transverse and ascending portions of the duodenum, consistent with infectious or inflammatory enteritis. 2.  No evidence of urinary tract calculus or hydronephrosis. 3.  Coronary artery disease.  Aortic Atherosclerosis (ICD10-I70.0). Electronically Signed   By: Lauralyn PrimesAlex  Bibbey M.D.   On: 08/18/2019 18:03    EKG: Independently reviewed.  Sinus rhythm, PR prolongation.  No significant change since prior tracing.  Assessment/Plan Principal Problem:   GI bleed Active Problems:   Atrial fibrillation (HCC)   Enteritis   AKI (acute kidney injury) (HCC)   GI bleed: Initially hypotensive but blood pressure has improved after IV fluid boluses.  FOBT positive.  Melanotic stool noted on exam.  Hemoglobin 11.1, stable compared to prior labs.  No further episodes of melena since she has been in the ED.  No prior EGD results in the chart but does have a documented history of gastritis.  In addition, CT with findings suspicious for infectious versus inflammatory enteritis. -GI has been consulted.  Keep n.p.o. for possible EGD.  IV PPI every 12 hours.  Continue IV fluids.  Hold aspirin.  Serial H&H.  Enteritis: Suspect hypotension is related  to severe volume depletion/dehydration. BUN significantly elevated on labs.  Sepsis less likely given no fever or significant leukocytosis on labs.  Lactic acid normal.  Blood pressure has now improved after IV fluid boluses. -Continue IV fluids.  Continue antibiotic coverage with ceftriaxone and Flagyl.  Continue to monitor WBC count.  GI has been consulted.  Blood cultures pending.  Order GI pathogen panel.  AKI: Suspect prerenal due to hypotension/severe dehydration in the setting of home diuretic and ARB use..  Continue IV fluid hydration.  Monitor renal function and urine output closely.  Avoid nephrotoxic agents/contrast.  Order renal ultrasound.  Check urine sodium, creatinine.  Paroxysmal A. fib: Not on anticoagulation, suspect due to advanced age/fragility.  Currently in sinus rhythm.  DVT prophylaxis: SCDs Code Status: Full code.  Discussed with patient and her niece at bedside. Family Communication: Niece updated. Disposition Plan: Anticipate discharge after clinical improvement. Consults called: GI (Dr. Chales Abrahams) Admission status: It is my clinical opinion that admission to INPATIENT is reasonable and necessary because of the expectation that this patient will require hospital care that crosses at least 2 midnights to treat this condition based on the medical complexity of the problems presented.  Given the aforementioned information, the predictability of an adverse outcome is felt to be significant.  The medical decision making on this patient was of high complexity and the patient is at high risk for clinical deterioration, therefore this is a level 3 visit.  John Giovanni MD [facility]  If 7PM-7AM, please contact night-coverage [email or web address]  08/18/2019, 11:09 PM

## 2019-08-19 ENCOUNTER — Inpatient Hospital Stay (HOSPITAL_COMMUNITY): Payer: Medicare Other

## 2019-08-19 DIAGNOSIS — K922 Gastrointestinal hemorrhage, unspecified: Secondary | ICD-10-CM

## 2019-08-19 DIAGNOSIS — I959 Hypotension, unspecified: Secondary | ICD-10-CM | POA: Diagnosis present

## 2019-08-19 LAB — CBC
HCT: 27.7 % — ABNORMAL LOW (ref 36.0–46.0)
HCT: 34.6 % — ABNORMAL LOW (ref 36.0–46.0)
HCT: 32 % — ABNORMAL LOW (ref 36.0–46.0)
HCT: 32.7 % — ABNORMAL LOW (ref 36.0–46.0)
Hemoglobin: 9 g/dL — ABNORMAL LOW (ref 12.0–15.0)
Hemoglobin: 10.9 g/dL — ABNORMAL LOW (ref 12.0–15.0)
Hemoglobin: 10.3 g/dL — ABNORMAL LOW (ref 12.0–15.0)
Hemoglobin: 10.4 g/dL — ABNORMAL LOW (ref 12.0–15.0)
MCH: 28.6 pg (ref 26.0–34.0)
MCH: 28.2 pg (ref 26.0–34.0)
MCH: 28.4 pg (ref 26.0–34.0)
MCH: 28 pg (ref 26.0–34.0)
MCHC: 32.5 g/dL (ref 30.0–36.0)
MCHC: 31.5 g/dL (ref 30.0–36.0)
MCHC: 32.2 g/dL (ref 30.0–36.0)
MCHC: 31.8 g/dL (ref 30.0–36.0)
MCV: 87.9 fL (ref 80.0–100.0)
MCV: 89.6 fL (ref 80.0–100.0)
MCV: 88.2 fL (ref 80.0–100.0)
MCV: 88.1 fL (ref 80.0–100.0)
Platelets: 152 10*3/uL (ref 150–400)
Platelets: 229 10*3/uL (ref 150–400)
Platelets: 229 10*3/uL (ref 150–400)
Platelets: 242 10*3/uL (ref 150–400)
RBC: 3.15 MIL/uL — ABNORMAL LOW (ref 3.87–5.11)
RBC: 3.86 MIL/uL — ABNORMAL LOW (ref 3.87–5.11)
RBC: 3.63 MIL/uL — ABNORMAL LOW (ref 3.87–5.11)
RBC: 3.71 MIL/uL — ABNORMAL LOW (ref 3.87–5.11)
RDW: 16.3 % — ABNORMAL HIGH (ref 11.5–15.5)
RDW: 16.6 % — ABNORMAL HIGH (ref 11.5–15.5)
RDW: 16.6 % — ABNORMAL HIGH (ref 11.5–15.5)
RDW: 16.6 % — ABNORMAL HIGH (ref 11.5–15.5)
WBC: 9.8 10*3/uL (ref 4.0–10.5)
WBC: 13 10*3/uL — ABNORMAL HIGH (ref 4.0–10.5)
WBC: 12.7 10*3/uL — ABNORMAL HIGH (ref 4.0–10.5)
WBC: 12.7 10*3/uL — ABNORMAL HIGH (ref 4.0–10.5)
nRBC: 0 % (ref 0.0–0.2)
nRBC: 0 % (ref 0.0–0.2)
nRBC: 0 % (ref 0.0–0.2)
nRBC: 0 % (ref 0.0–0.2)

## 2019-08-19 LAB — RESPIRATORY PANEL BY RT PCR (FLU A&B, COVID)
Influenza A by PCR: NEGATIVE
Influenza B by PCR: NEGATIVE
SARS Coronavirus 2 by RT PCR: NEGATIVE

## 2019-08-19 LAB — BASIC METABOLIC PANEL
Anion gap: 8 (ref 5–15)
BUN: 76 mg/dL — ABNORMAL HIGH (ref 8–23)
CO2: 15 mmol/L — ABNORMAL LOW (ref 22–32)
Calcium: 7.8 mg/dL — ABNORMAL LOW (ref 8.9–10.3)
Chloride: 119 mmol/L — ABNORMAL HIGH (ref 98–111)
Creatinine, Ser: 1.94 mg/dL — ABNORMAL HIGH (ref 0.44–1.00)
GFR calc Af Amer: 25 mL/min — ABNORMAL LOW (ref >60)
GFR calc non Af Amer: 22 mL/min — ABNORMAL LOW (ref >60)
Glucose, Bld: 79 mg/dL (ref 70–99)
Potassium: 3.8 mmol/L (ref 3.5–5.1)
Sodium: 142 mmol/L (ref 135–145)

## 2019-08-19 LAB — URINE CULTURE: Culture: 40000 — AB

## 2019-08-19 LAB — LACTIC ACID, PLASMA: Lactic Acid, Venous: 2.2 mmol/L (ref 0.5–1.9)

## 2019-08-19 LAB — MRSA PCR SCREENING: MRSA by PCR: NEGATIVE

## 2019-08-19 MED ORDER — WHITE PETROLATUM EX OINT
1.0000 "application " | TOPICAL_OINTMENT | CUTANEOUS | Status: DC | PRN
  Administered 2019-08-21 – 2019-08-22 (×2): 1 via TOPICAL
  Filled 2019-08-19 (×2): qty 28.35

## 2019-08-19 MED ORDER — NOREPINEPHRINE 4 MG/250ML-% IV SOLN
2.0000 ug/min | INTRAVENOUS | Status: DC
  Administered 2019-08-19: 2 ug/min via INTRAVENOUS
  Filled 2019-08-19 (×2): qty 250

## 2019-08-19 MED ORDER — BUSPIRONE HCL 5 MG PO TABS
5.0000 mg | ORAL_TABLET | Freq: Every day | ORAL | Status: DC
  Administered 2019-08-19 – 2019-08-23 (×5): 5 mg via ORAL
  Filled 2019-08-19 (×5): qty 1

## 2019-08-19 MED ORDER — MEMANTINE HCL 10 MG PO TABS
5.0000 mg | ORAL_TABLET | Freq: Every day | ORAL | Status: DC
  Administered 2019-08-19 – 2019-08-24 (×5): 5 mg via ORAL
  Filled 2019-08-19 (×6): qty 1

## 2019-08-19 MED ORDER — ARIPIPRAZOLE 2 MG PO TABS
5.0000 mg | ORAL_TABLET | Freq: Every day | ORAL | Status: DC
  Administered 2019-08-19 – 2019-08-24 (×5): 5 mg via ORAL
  Filled 2019-08-19: qty 3
  Filled 2019-08-19 (×3): qty 1
  Filled 2019-08-19: qty 3
  Filled 2019-08-19: qty 1

## 2019-08-19 MED ORDER — ALBUTEROL SULFATE (2.5 MG/3ML) 0.083% IN NEBU: 2.5000 mg | INHALATION_SOLUTION | Freq: Four times a day (QID) | RESPIRATORY_TRACT | Status: DC | PRN

## 2019-08-19 MED ORDER — DOCUSATE SODIUM 100 MG PO CAPS
100.0000 mg | ORAL_CAPSULE | Freq: Two times a day (BID) | ORAL | Status: DC | PRN
  Administered 2019-08-21: 21:00:00 100 mg via ORAL

## 2019-08-19 MED ORDER — SODIUM CHLORIDE 0.9 % IV SOLN
250.0000 mL | INTRAVENOUS | Status: DC
  Administered 2019-08-19: 250 mL via INTRAVENOUS

## 2019-08-19 MED ORDER — CHLORHEXIDINE GLUCONATE CLOTH 2 % EX PADS
6.0000 | MEDICATED_PAD | Freq: Every day | CUTANEOUS | Status: DC
  Administered 2019-08-19 – 2019-08-24 (×5): 6 via TOPICAL

## 2019-08-19 MED ORDER — LEVOTHYROXINE SODIUM 50 MCG PO TABS
50.0000 ug | ORAL_TABLET | Freq: Every day | ORAL | Status: DC
  Administered 2019-08-19 – 2019-08-24 (×5): 50 ug via ORAL
  Filled 2019-08-19 (×5): qty 1

## 2019-08-19 MED ORDER — POLYETHYLENE GLYCOL 3350 17 G PO PACK: 17.0000 g | PACK | Freq: Every day | ORAL | Status: DC | PRN

## 2019-08-19 MED ORDER — SODIUM CHLORIDE 0.9 % IV BOLUS
1000.0000 mL | Freq: Once | INTRAVENOUS | Status: AC
  Administered 2019-08-19: 1000 mL via INTRAVENOUS

## 2019-08-19 MED ORDER — SODIUM CHLORIDE 0.9 % IV SOLN: INTRAVENOUS | Status: DC

## 2019-08-19 MED ORDER — MOMETASONE FURO-FORMOTEROL FUM 200-5 MCG/ACT IN AERO
2.0000 | INHALATION_SPRAY | Freq: Two times a day (BID) | RESPIRATORY_TRACT | Status: DC
  Administered 2019-08-19 – 2019-08-24 (×8): 2 via RESPIRATORY_TRACT
  Filled 2019-08-19 (×2): qty 8.8

## 2019-08-19 MED ORDER — AMIODARONE HCL 200 MG PO TABS
100.0000 mg | ORAL_TABLET | Freq: Every day | ORAL | Status: DC
  Administered 2019-08-19 – 2019-08-24 (×5): 100 mg via ORAL
  Filled 2019-08-19 (×5): qty 1

## 2019-08-19 NOTE — H&P (Addendum)
NAME:  [name], MRN:  [id number], DOB:  1925-03-28, LOS: 1 ADMISSION DATE:  08/18/2019, CONSULTATION DATE:  08/18/19  REFERRING MD:  Dr. Chaney Malling, CHIEF COMPLAINT:  sepsis   Brief History   86 year old presented to ED for abdominal pain, melena, hypotension  History of present illness   86 year old female hx chf, diverticulosis presents with abdominal pain for a few days, melena for last day.  She resides at [facility].  She was noted to have several episodes of brbpr today.  Arrived to ed with bp 74/55.  Given 2L fluid, cipro, flagyl.  Ct renal consistent with enteritis.  Ct head no intracranial abnormalities.  Hb 11, lactate 1.3.    Recent hospital admission 07/24/19 for UTI.  D/c home on cephalexin   Right femur fracture s/p ORIF 06/26/19  Admitted to medicine service initially.  Received total 3.5 L.  Continued with episode hypotension with sbp 80-90's.    Past Medical History  Diverticulosis htn afib Dementia   Significant Hospital Events     Consults:  GI, critical care, hospitalist   Procedures:    Significant Diagnostic Tests:  Lactate 1.3  Ct renal- enteritis   Micro Data:  ucx- pending bcx- pending   Antimicrobials:  cipro 08/18/19- Flagyl 08/18/19-    Interim history/subjective:    Objective   Blood pressure (!) 86/43, pulse (!) 56, temperature 97.6 F (36.4 C), temperature source Oral, resp. rate (!) 9, height 5\' 6"  (1.676 m), weight 63.5 kg, SpO2 97 %.        Intake/Output Summary (Last 24 hours) at 08/19/2019 0202 Last data filed at 08/19/2019 0046 Gross per 24 hour  Intake 3627.47 ml  Output --  Net 3627.47 ml   Filed Weights   08/18/19 1504  Weight: 63.5 kg    Examination: General: nad, alert and oriented HENT: dry mucous membranes  Lungs: cta  Cardiovascular: rrr Abdomen: soft, epigastric tenderness, bs+ Extremities: warm, no le edema Neuro: non focal  GU:   Resolved Hospital Problem list      Assessment & Plan:  86 year old female with recent admission for femur fracture, UTI in 3/21 presents with abdominal pain, melana,enteritis.    Sepsis/hypovolemia- originally hypotensive. Baseline bp in chart around 110/60.  She received 2L in ed with bp around 110/70.  Lactate 1.3.  She received abx cipro, flagyl for enteritis.  She also had BRBPR .  Hb 11.  - will start levophed gtt for map >65 - continue ceftriaxone, flagyl for enteritis - pending possible egd, colonoscopy for melana/brbpr and previously on asa 325 bid since orthopedic surgery 06/26/19 - will recommend one more liter bolus given patient is dry by exam  GI bleed - would keep npo for possible procedure in morning  - hold asa  - await GI recs  - trend hb q6hours    aki- likely secondary to hypovolemia/sepsis.  Received 3.5L fluid - will get cmp in am  - will get renal u/s in am - monitor urine output   Best practice:  Diet: NPO Pain/Anxiety/Delirium protocol (if indicated):  VAP protocol (if indicated):  DVT prophylaxis:  scd GI prophylaxis:  PPI bid Glucose control: SSI Mobility:  Code Status: full code Family Communication: discussed patient with niece.  She states patient wishes for intubation, pressors if medical problems are obviously reversible.   Disposition: recommend med-tele  Labs   CBC: Recent Labs  Lab 08/18/19 1645  WBC 13.0*  NEUTROABS  10.8*  HGB 11.1*  HCT 34.2*  MCV 88.4  PLT 782    Basic Metabolic Panel: Recent Labs  Lab 08/18/19 1645  NA 137  K 4.4  CL 108  CO2 21*  GLUCOSE 105*  BUN 119*  CREATININE 2.97*  CALCIUM 8.3*   GFR: Estimated Creatinine Clearance: 11.3 mL/min (A) (by C-G formula based on SCr of 2.97 mg/dL (H)). Recent Labs  Lab 08/18/19 1645 08/18/19 1904  WBC 13.0*  --   LATICACIDVEN  --  1.3    Liver Function Tests: Recent Labs  Lab 08/18/19 1645  AST 31  ALT 25  ALKPHOS 88  BILITOT 0.7  PROT 4.5*  ALBUMIN 2.3*   No results for input(s):  LIPASE, AMYLASE in the last 168 hours. No results for input(s): AMMONIA in the last 168 hours.  ABG No results found for: PHART, PCO2ART, PO2ART, HCO3, TCO2, ACIDBASEDEF, O2SAT   Coagulation Profile: No results for input(s): INR, PROTIME in the last 168 hours.  Cardiac Enzymes: No results for input(s): CKTOTAL, CKMB, CKMBINDEX, TROPONINI in the last 168 hours.  HbA1C: No results found for: HGBA1C  CBG: No results for input(s): GLUCAP in the last 168 hours.  Review of Systems:     Past Medical History  She,  has a past medical history of Abdominal pain, Atrophic vaginitis, Cellulitis (06/2019), CHF (congestive heart failure) ([place]), Colon polyps, Diverticulosis, Essential hypertension, benign, Gastritis, Hiatal hernia, Insomnia, idiopathic, Malaise and fatigue, Osteoarthrosis and allied disorders, Other and unspecified hyperlipidemia, and Thoracic scoliosis.   Surgical History    Past Surgical History:  Procedure Laterality Date  . JOINT REPLACEMENT    . left shoulder surgery     . rt knee   10 1 08   Dr Theda Sers  . Rt rotator  cuff repair  4 1 00  . TOTAL HIP ARTHROPLASTY Right 2021  ?     Social History   reports that she has quit smoking. Her smoking use included cigarettes. She has a 0.50 pack-year smoking history. She has never used smokeless tobacco. She reports that she does not drink alcohol or use drugs.   Family History   Her family history includes Cancer in her sister; Heart disease (age of onset: 63) in her father.   Allergies Allergies  Allergen Reactions  . Simvastatin Other (See Comments)    Leg pain- "Allergic," per MAR  . Ace Inhibitors Cough    "Allergic," per [facility]     Home Medications  Prior to Admission medications   Medication Sig Start Date End Date Taking? Authorizing Provider  acetaminophen (TYLENOL) 325 MG tablet Take 2 tablets (650 mg total) by mouth every 6 (six) hours as needed for mild pain (or Fever >/= 101). 07/24/19   Danford,  Suann Larry, MD  albuterol (PROAIR HFA) 108 (90 Base) MCG/ACT inhaler Inhale 2 puffs into the lungs every 6 (six) hours as needed. Patient taking differently: Inhale 2 puffs into the lungs every 4 (four) hours as needed for wheezing or shortness of breath.  06/19/18   Chipper Herb, MD  amiodarone (PACERONE) 200 MG tablet TAKE (1/2) TABLET DAILY. Patient taking differently: Take 200 mg by mouth in the morning.  06/19/18   Chipper Herb, MD  ARIPiprazole (ABILIFY) 5 MG tablet Take 5 mg by mouth daily.    [provider]  budesonide-formoterol (SYMBICORT) 160-4.5 MCG/ACT inhaler Inhale 2 puffs into the lungs 2 (two) times daily.    [provider]  busPIRone (BUSPAR) 5  MG tablet Take 1 tablet (5 mg total) by mouth at bedtime. 06/19/18   Ernestina Penna, MD  donepezil (ARICEPT) 10 MG tablet Take 1 tablet (10 mg total) by mouth daily. Patient taking differently: Take 10 mg by mouth at bedtime.  06/19/18   Ernestina Penna, MD  furosemide (LASIX) 20 MG tablet TAKE 2 TABLET A DAY AS DIRECTED. Patient taking differently: Take 20 mg by mouth in the morning.  06/19/18   Ernestina Penna, MD  levothyroxine (SYNTHROID) 50 MCG tablet Take 50 mcg by mouth daily before breakfast.    [provider]  losartan (COZAAR) 100 MG tablet Take 100 mg by mouth in the morning.    [provider]  memantine (NAMENDA) 10 MG tablet TAKE (1) TABLET TWICE A DAY. Patient taking differently: Take 10 mg by mouth 2 (two) times daily.  06/19/18   Ernestina Penna, MD  metoprolol succinate (TOPROL-XL) 25 MG 24 hr tablet Take 1 tablet (25 mg total) by mouth daily. Patient taking differently: Take 25 mg by mouth in the morning.  06/19/18   Ernestina Penna, MD  omeprazole (PRILOSEC OTC) 20 MG tablet Take 20 mg by mouth in the morning.    [provider]  oxyCODONE (OXY IR/ROXICODONE) 5 MG immediate release tablet Take 1 tablet (5 mg total) by mouth every 6 (six) hours as needed (for pain  management). 07/24/19   Danford, Earl Lites, MD  potassium chloride SA (K-DUR,KLOR-CON) 20 MEQ tablet Take 1 tablet (20 mEq total) by mouth daily. 06/19/18   Ernestina Penna, MD  senna (SENOKOT) 8.6 MG TABS tablet Take 2 tablets by mouth 2 (two) times daily.    [provider]  Spacer/Aero-Holding Rudean Curt Use with symbicort inhaler BID 06/19/18   Ernestina Penna, MD     Critical care time: 40 minutes

## 2019-08-19 NOTE — ED Notes (Signed)
Renal u/s in progress

## 2019-08-19 NOTE — H&P (View-Only) (Signed)
[facility] Consult: 12:29 PM 08/19/2019  LOS: 1 day    Referring Provider: DR Lucinda Dell  Primary Care Physician:  Raliegh Ip, DO Primary Gastroenterologist:  Dr Ewing Schlein >> [facility], Salvadore Oxford MD    Reason for Consultation: Dark stools, FOBT positive.   HPI: [name] is a 86 y.o. female.  PMH dementia.  CHF.  A. fib.  Anemia, Hb 8.5 in 11/2018.  03/2002 colonoscopy: Mixed hemorrhoids.  Tiny rectal polyps.  Occasional diverticula, mostly in sigmoid. 03/2002 EGD.  Medium sized hiatal hernia.  Moderate gastritis, antritis. Office visit for evaluation of anemia in 12/2018 with Dr. Lorin Picket.  There had been no overt GI bleeding and GI complaints only of occasional slight nausea.  The outcome of visit was family and patient prefer to avoid endoscopic evaluation "unless absolutely necessary" Recent knee surgery at [facility] after fall.    Came from [facility], rehab with complaints of flank pain, fatigue, abdominal pain for 2 days.  Black and tarry stools on day of admission yesterday.  Several episodes of bright red blood per rectum today with blood pressure dropped to 74/55. CT scan, renal stone study shows inflammation, stranding at transverse and a sending portions of duodenum, ? infectious versus inflammatory enteritis. Hgb 11.1 >> 9 >> 10.9.  Hgb 9.3 on 07/24/2019.  MCV 87.  Has not received blood transfusion.  Platelets normal INR 1.1 AKI 119/2.9 >> 76/1.9.  Was 16/1.0 on 3/30. LFTs normal. FOBT positive  Patient's not a great historian but currently denies nausea, abdominal pain or dark stools. Listed outpatient meds do not include any PPI, H2 blocker or platelet disruptors, anticoagulation medications.  No aspirin or NSAIDs.   No family history of colon cancer or GI  malignancies.  Past Medical History:  Diagnosis Date  . Abdominal pain   . Atrophic vaginitis   . Cellulitis 06/2019  . CHF (congestive heart failure) (HCC)   . Colon polyps    Dr Ewing Schlein  . Diverticulosis    Dr Ewing Schlein  . Essential hypertension, benign   . Gastritis   . Hiatal hernia   . Insomnia, idiopathic   . Malaise and fatigue   . Osteoarthrosis and allied disorders   . Other and unspecified hyperlipidemia   . Thoracic scoliosis     Past Surgical History:  Procedure Laterality Date  . JOINT REPLACEMENT    . left shoulder surgery     . rt knee   10 1 08   Dr Thomasena Edis  . Rt rotator  cuff repair  4 1 00  . TOTAL HIP ARTHROPLASTY Right 2021  ?    Prior to Admission medications   Medication Sig Start Date End Date Taking? Authorizing Provider  acetaminophen (TYLENOL) 500 MG tablet Take 500 mg by mouth every 4 (four) hours as needed for mild pain.   Yes [provider]  albuterol (PROAIR HFA) 108 (90 Base) MCG/ACT inhaler Inhale 2 puffs into the lungs every 6 (six) hours as needed. Patient taking differently: Inhale 2 puffs into the lungs every 6 (  six) hours as needed for wheezing or shortness of breath.  06/19/18  Yes Ernestina Penna, MD  amiodarone (PACERONE) 100 MG tablet Take 100 mg by mouth daily.   Yes [provider]  ARIPiprazole (ABILIFY) 5 MG tablet Take 5 mg by mouth daily.   Yes [provider]  budesonide-formoterol (SYMBICORT) 160-4.5 MCG/ACT inhaler Inhale 2 puffs into the lungs 2 (two) times daily.   Yes [provider]  busPIRone (BUSPAR) 5 MG tablet Take 1 tablet (5 mg total) by mouth at bedtime. 06/19/18  Yes Ernestina Penna, MD  donepezil (ARICEPT) 10 MG tablet Take 1 tablet (10 mg total) by mouth daily. Patient taking differently: Take 10 mg by mouth at bedtime.  06/19/18  Yes Ernestina Penna, MD  furosemide (LASIX) 20 MG tablet TAKE 2 TABLET A DAY AS DIRECTED. Patient taking differently: Take 20 mg by mouth in the morning.   06/19/18  Yes Ernestina Penna, MD  levothyroxine (SYNTHROID) 50 MCG tablet Take 50 mcg by mouth daily before breakfast.   Yes [provider]  losartan (COZAAR) 100 MG tablet Take 100 mg by mouth in the morning.   Yes [provider]  memantine (NAMENDA) 10 MG tablet TAKE (1) TABLET TWICE A DAY. Patient taking differently: Take 10 mg by mouth 2 (two) times daily.  06/19/18  Yes Ernestina Penna, MD  metoprolol succinate (TOPROL-XL) 25 MG 24 hr tablet Take 1 tablet (25 mg total) by mouth daily. Patient taking differently: Take 25 mg by mouth in the morning.  06/19/18  Yes Ernestina Penna, MD  nystatin cream (MYCOSTATIN) Apply 1 application topically See admin instructions. Apply to buttocks for 14 days 08/09/19 08/22/19 Yes [provider]  oxyCODONE (OXY IR/ROXICODONE) 5 MG immediate release tablet Take 1 tablet (5 mg total) by mouth every 6 (six) hours as needed (for pain management). 07/24/19  Yes Danford, Earl Lites, MD  potassium chloride SA (K-DUR,KLOR-CON) 20 MEQ tablet Take 1 tablet (20 mEq total) by mouth daily. 06/19/18  Yes Ernestina Penna, MD  senna (SENOKOT) 8.6 MG TABS tablet Take 1 tablet by mouth 2 (two) times daily.    Yes [provider]  acetaminophen (TYLENOL) 325 MG tablet Take 2 tablets (650 mg total) by mouth every 6 (six) hours as needed for mild pain (or Fever >/= 101). Patient not taking: Reported on 08/18/2019 07/24/19   Alberteen Sam, MD  amiodarone (PACERONE) 200 MG tablet TAKE (1/2) TABLET DAILY. Patient not taking: Reported on 08/18/2019 06/19/18   Ernestina Penna, MD  Spacer/Aero-Holding Rudean Curt Use with symbicort inhaler BID 06/19/18   Ernestina Penna, MD    Scheduled Meds: . amiodarone  100 mg Oral Daily  . ARIPiprazole  5 mg Oral Daily  . busPIRone  5 mg Oral QHS  . levothyroxine  50 mcg Oral QAC breakfast  . memantine  5 mg Oral Daily  . mometasone-formoterol  2 puff Inhalation BID  . pantoprazole (PROTONIX) IV  40  mg Intravenous Q12H   Infusions: . sodium chloride 250 mL (08/19/19 0209)  . cefTRIAXone (ROCEPHIN)  IV Stopped (08/18/19 2326)  . metronidazole Stopped (08/19/19 1105)  . norepinephrine (LEVOPHED) Adult infusion 3 mcg/min (08/19/19 0338)   PRN Meds: acetaminophen **OR** acetaminophen, albuterol, docusate sodium, polyethylene glycol   Allergies as of 08/18/2019 - Review Complete 08/18/2019  Allergen Reaction Noted  . Simvastatin Other (See Comments) 07/12/2010  . Ace inhibitors Cough 07/12/2010    Family History  Problem Relation Age of Onset  . Heart disease Father 36  . Cancer Sister     Social History   Socioeconomic History  . Marital status: Divorced    Spouse name: Not on file  . Number of children: Not on file  . Years of education: Not on file  . Highest education level: Not on file  Occupational History  . Occupation: retired  Tobacco Use  . Smoking status: Former Smoker    Packs/day: 0.10    Years: 5.00    Pack years: 0.50    Types: Cigarettes  . Smokeless tobacco: Never Used  . Tobacco comment: pt states only smoke socially in teens  Substance and Sexual Activity  . Alcohol use: No  . Drug use: No  . Sexual activity: Not on file  Other Topics Concern  . Not on file  Social History Narrative  . Not on file   Social Determinants of Health   Financial Resource Strain:   . Difficulty of Paying Living Expenses:   Food Insecurity:   . Worried About Programme researcher, broadcasting/film/video in the Last Year:   . Barista in the Last Year:   Transportation Needs:   . Freight forwarder (Medical):   Marland Kitchen Lack of Transportation (Non-Medical):   Physical Activity:   . Days of Exercise per Week:   . Minutes of Exercise per Session:   Stress:   . Feeling of Stress :   Social Connections:   . Frequency of Communication with Friends and Family:   . Frequency of Social Gatherings with Friends and Family:   . Attends Religious Services:   . Active Member of Clubs or  Organizations:   . Attends Banker Meetings:   Marland Kitchen Marital Status:   Intimate Partner Violence:   . Fear of Current or Ex-Partner:   . Emotionally Abused:   Marland Kitchen Physically Abused:   . Sexually Abused:     REVIEW OF SYSTEMS: Constitutional: Weakness, fatigue. ENT:  No nose bleeds Pulm: No shortness of breath or cough CV:  No palpitations, no LE edema.  No chest pain GU:  No hematuria, no frequency GI: See HPI. Heme: Patient denies unusual or excessive bleeding or bruising Transfusions: None Neuro:  No headaches, no peripheral tingling or numbness Derm:  No itching, no rash or sores.  Endocrine:  No sweats or chills.  No polyuria or dysuria Immunization: Not queried Travel:  None beyond local counties in last few months.    PHYSICAL EXAM: Vital signs in last 24 hours: Vitals:   08/19/19 1100 08/19/19 1130  BP: (!) 120/46 (!) 122/49  Pulse: 60 60  Resp: 11 10  Temp:    SpO2: 100% 100%   Wt Readings from Last 3 Encounters:  08/18/19 63.5 kg  07/22/19 63.3 kg  07/21/19 63.5 kg    General: Frail, aged, comfortable, alert Head: No facial asymmetry or swelling.  No signs of head trauma. Eyes: Conjunctiva pale.  No scleral icterus. Ears: Slightly HOH Nose: No discharge or congestion Mouth: Dentition in poor repair, tongue midline.  Mucosa pink, moist, clear. Neck: No JVD, no masses, no thyromegaly Lungs: Clear bilaterally without labored breathing or cough Heart: RRR.  No MRG.  S1, S2 present Abdomen: Soft, nondistended, nontender.  No HSM, masses, bruits, hernias..   Rectal: Deferred.  FOBT positive specimen described as melena per ED exam.   Musc/Skeltl: No joint redness or swelling.  Overall osteoporotic appearance Extremities: No CCE Neurologic: Oriented  to "hospital", self.  Not oriented to date/year/time or reason for being at the hospital Skin: Some purpura on the arms, Tattoos: None Nodes: No cervical adenopathy Psych: Cooperative, pleasant,  calm.  Intake/Output from previous day: 04/24 0701 - 04/25 0700 In: 3627.5 [I.V.:27.4; IV Piggyback:3600.1] Out: 850 [Urine:850] Intake/Output this shift: No intake/output data recorded.  LAB RESULTS: Recent Labs    08/18/19 1645 08/19/19 0154 08/19/19 0745  WBC 13.0* 9.8 13.0*  HGB 11.1* 9.0* 10.9*  HCT 34.2* 27.7* 34.6*  PLT 227 152 229   BMET Lab Results  Component Value Date   NA 142 08/19/2019   NA 137 08/18/2019   NA 134 (L) 07/24/2019   K 3.8 08/19/2019   K 4.4 08/18/2019   K 4.0 07/24/2019   CL 119 (H) 08/19/2019   CL 108 08/18/2019   CL 99 07/24/2019   CO2 15 (L) 08/19/2019   CO2 21 (L) 08/18/2019   CO2 24 07/24/2019   GLUCOSE 79 08/19/2019   GLUCOSE 105 (H) 08/18/2019   GLUCOSE 86 07/24/2019   BUN 76 (H) 08/19/2019   BUN 119 (H) 08/18/2019   BUN 16 07/24/2019   CREATININE 1.94 (H) 08/19/2019   CREATININE 2.97 (H) 08/18/2019   CREATININE 1.07 (H) 07/24/2019   CALCIUM 7.8 (L) 08/19/2019   CALCIUM 8.3 (L) 08/18/2019   CALCIUM 8.4 (L) 07/24/2019   LFT Recent Labs    08/18/19 1645  PROT 4.5*  ALBUMIN 2.3*  AST 31  ALT 25  ALKPHOS 88  BILITOT 0.7   PT/INR Lab Results  Component Value Date   INR 1.1 07/22/2019   INR 0.93 03/31/2009   INR 1.0 02/08/2007   Hepatitis Panel No results for input(s): HEPBSAG, HCVAB, HEPAIGM, HEPBIGM in the last 72 hours. C-Diff No components found for: CDIFF Lipase  No results found for: LIPASE  Drugs of Abuse  No results found for: LABOPIA, COCAINSCRNUR, LABBENZ, AMPHETMU, THCU, LABBARB   RADIOLOGY STUDIES: CT Head Wo Contrast  Result Date: 08/18/2019 CLINICAL DATA:  Fatigue, hypotension, cerebral hemorrhage suspected EXAM: CT HEAD WITHOUT CONTRAST TECHNIQUE: Contiguous axial images were obtained from the base of the skull through the vertex without intravenous contrast. COMPARISON:  07/22/2019 FINDINGS: Brain: No evidence of acute infarction, hemorrhage, hydrocephalus, extra-axial collection or mass  lesion/mass effect. Periventricular white matter hypodensity and global volume loss. Mega cisterna magna variant of the posterior fossa. Vascular: No hyperdense vessel or unexpected calcification. Skull: Normal. Negative for fracture or focal lesion. Sinuses/Orbits: No acute finding. Other: None. IMPRESSION: No acute intracranial pathology.  Small-vessel white matter disease. Electronically Signed   By: Eddie Candle M.D.   On: 08/18/2019 17:58   US RENAL  Result Date: 08/19/2019 CLINICAL DATA:  Acute renal injury. EXAM: RENAL / URINARY TRACT ULTRASOUND COMPLETE COMPARISON:  None. FINDINGS: Right Kidney: Renal measurements: 10.5 x 4.1 x 5.0 cm. There is a small amount of right perinephric fluid. Left Kidney: Renal measurements: 9.7 x 4.0 x 4.3 cm. Echogenicity within normal limits. No mass or hydronephrosis visualized. Bladder: Appears normal for degree of bladder distention. Other: None. IMPRESSION: 1. There is a small amount of right perinephric fluid which is nonspecific. No other abnormalities identified. Electronically Signed   By: Dorise Bullion III M.D   On: 08/19/2019 12:25   DG Chest Port 1 View  Result Date: 08/18/2019 CLINICAL DATA:  86 year old female with weakness, hypotension. Evaluate for pneumonia. EXAM: PORTABLE CHEST 1 VIEW COMPARISON:  Prior chest x-ray 07/22/2019 FINDINGS: Stable cardiac and mediastinal contours. Atherosclerotic calcifications  present throughout the aorta. No focal airspace opacity to suggest pneumonia. Chronic bronchitic changes remain stable. Minimal right apical pleuroparenchymal scarring. Evidence of prior bilateral rotator cuff repair. No acute osseous abnormality. IMPRESSION: No active disease. Electronically Signed   By: Malachy MoanHeath  McCullough M.D.   On: 08/18/2019 15:29   CT Renal Stone Study  Result Date: 08/18/2019 CLINICAL DATA:  Pro flank pain, hypotension EXAM: CT ABDOMEN AND PELVIS WITHOUT CONTRAST TECHNIQUE: Multidetector CT imaging of the abdomen and pelvis  was performed following the standard protocol without IV contrast. COMPARISON:  None. FINDINGS: Lower chest: No acute abnormality.  Coronary artery calcifications. Hepatobiliary: No solid liver abnormality is seen. No gallstones, gallbladder wall thickening, or biliary dilatation. Pancreas: Unremarkable. No pancreatic ductal dilatation or surrounding inflammatory changes. Spleen: Normal in size without significant abnormality. Adrenals/Urinary Tract: Adrenal glands are unremarkable. Kidneys are normal, without renal calculi, solid lesion, or hydronephrosis. Bladder is unremarkable. Stomach/Bowel: Stomach is within normal limits. Appendix appears normal. There is inflammatory stranding about the transverse and ascending portions of the duodenum (series 3, image 30). Sigmoid diverticulosis. Vascular/Lymphatic: Aortic atherosclerosis. No enlarged abdominal or pelvic lymph nodes. Reproductive: No mass or other significant abnormality. Other: No abdominal wall hernia or abnormality. No abdominopelvic ascites. Musculoskeletal: No acute or significant osseous findings. Status post right hip total arthroplasty. IMPRESSION: 1. Inflammatory stranding about the transverse and ascending portions of the duodenum, consistent with infectious or inflammatory enteritis. 2.  No evidence of urinary tract calculus or hydronephrosis. 3.  Coronary artery disease.  Aortic Atherosclerosis (ICD10-I70.0). Electronically Signed   By: Lauralyn PrimesAlex  Bibbey M.D.   On: 08/18/2019 18:03     IMPRESSION:   *    GI bleed.  CT suggests duodenal inflammation. She is been started on Protonix 40 IV bid, Flagyl and Rocephin IV.  NPO.    PLAN:     *    EGD tomorrow.  Orders placed.  Niece Sabrina MaidensLinda Rhodes [phone]. Spoke w her and she is ok w procedure.     Jennye MoccasinSarah Gribbin  08/19/2019, 12:29 PM Phone [phone]   Attending physician's note   I have taken an interval history, reviewed the chart and examined the patient. I agree with the  Advanced Practitioner's note, impression and recommendations.   [age]yr old with dementia, GERD with HH, CHF, A Fib (on amiodarone, no AC), AKI (Cr 2.9 to 1.9) d/t ?overdiuresis with melena. Recent N/V. HD stable. Hb 10.9 (baseline). NCCT showing duodenal inflammation.  Plan: -IV protonix -Full liquid diet. -EGD in AM.  I have discussed risks and benefits with the patient and patient's niece was very involved in the care. -? Adjust amiodarone if continued N/V -medicine svc to determine. -Trend CBC.  Edman Circleaj Krista Som, MD Corinda GublerLebauer Sand[facility] ManlyGI [phone].

## 2019-08-19 NOTE — ED Notes (Signed)
Dinner tray ordered.

## 2019-08-19 NOTE — Consult Note (Addendum)
[facility] Consult: 12:29 PM 08/19/2019  LOS: 1 day    Referring Provider: DR Lucinda Dell  Primary Care Physician:  Raliegh Ip, DO Primary Gastroenterologist:  Dr Ewing Schlein >> [facility], Salvadore Oxford MD    Reason for Consultation: Dark stools, FOBT positive.   HPI: [name] is a 87 y.o. female.  PMH dementia.  CHF.  A. fib.  Anemia, Hb 8.5 in 11/2018.  03/2002 colonoscopy: Mixed hemorrhoids.  Tiny rectal polyps.  Occasional diverticula, mostly in sigmoid. 03/2002 EGD.  Medium sized hiatal hernia.  Moderate gastritis, antritis. Office visit for evaluation of anemia in 12/2018 with Dr. Lorin Picket.  There had been no overt GI bleeding and GI complaints only of occasional slight nausea.  The outcome of visit was family and patient prefer to avoid endoscopic evaluation "unless absolutely necessary" Recent knee surgery at [facility] after fall.    Came from [facility], rehab with complaints of flank pain, fatigue, abdominal pain for 2 days.  Black and tarry stools on day of admission yesterday.  Several episodes of bright red blood per rectum today with blood pressure dropped to 74/55. CT scan, renal stone study shows inflammation, stranding at transverse and a sending portions of duodenum, ? infectious versus inflammatory enteritis. Hgb 11.1 >> 9 >> 10.9.  Hgb 9.3 on 07/24/2019.  MCV 87.  Has not received blood transfusion.  Platelets normal INR 1.1 AKI 119/2.9 >> 76/1.9.  Was 16/1.0 on 3/30. LFTs normal. FOBT positive  Patient's not a great historian but currently denies nausea, abdominal pain or dark stools. Listed outpatient meds do not include any PPI, H2 blocker or platelet disruptors, anticoagulation medications.  No aspirin or NSAIDs.   No family history of colon cancer or GI  malignancies.  Past Medical History:  Diagnosis Date  . Abdominal pain   . Atrophic vaginitis   . Cellulitis 06/2019  . CHF (congestive heart failure) (HCC)   . Colon polyps    Dr Ewing Schlein  . Diverticulosis    Dr Ewing Schlein  . Essential hypertension, benign   . Gastritis   . Hiatal hernia   . Insomnia, idiopathic   . Malaise and fatigue   . Osteoarthrosis and allied disorders   . Other and unspecified hyperlipidemia   . Thoracic scoliosis     Past Surgical History:  Procedure Laterality Date  . JOINT REPLACEMENT    . left shoulder surgery     . rt knee   10 1 08   Dr Thomasena Edis  . Rt rotator  cuff repair  4 1 00  . TOTAL HIP ARTHROPLASTY Right 2021  ?    Prior to Admission medications   Medication Sig Start Date End Date Taking? Authorizing Provider  acetaminophen (TYLENOL) 500 MG tablet Take 500 mg by mouth every 4 (four) hours as needed for mild pain.   Yes [provider]  albuterol (PROAIR HFA) 108 (90 Base) MCG/ACT inhaler Inhale 2 puffs into the lungs every 6 (six) hours as needed. Patient taking differently: Inhale 2 puffs into the lungs every 6 (  six) hours as needed for wheezing or shortness of breath.  06/19/18  Yes Ernestina Penna, MD  amiodarone (PACERONE) 100 MG tablet Take 100 mg by mouth daily.   Yes [provider]  ARIPiprazole (ABILIFY) 5 MG tablet Take 5 mg by mouth daily.   Yes [provider]  budesonide-formoterol (SYMBICORT) 160-4.5 MCG/ACT inhaler Inhale 2 puffs into the lungs 2 (two) times daily.   Yes [provider]  busPIRone (BUSPAR) 5 MG tablet Take 1 tablet (5 mg total) by mouth at bedtime. 06/19/18  Yes Ernestina Penna, MD  donepezil (ARICEPT) 10 MG tablet Take 1 tablet (10 mg total) by mouth daily. Patient taking differently: Take 10 mg by mouth at bedtime.  06/19/18  Yes Ernestina Penna, MD  furosemide (LASIX) 20 MG tablet TAKE 2 TABLET A DAY AS DIRECTED. Patient taking differently: Take 20 mg by mouth in the morning.   06/19/18  Yes Ernestina Penna, MD  levothyroxine (SYNTHROID) 50 MCG tablet Take 50 mcg by mouth daily before breakfast.   Yes [provider]  losartan (COZAAR) 100 MG tablet Take 100 mg by mouth in the morning.   Yes [provider]  memantine (NAMENDA) 10 MG tablet TAKE (1) TABLET TWICE A DAY. Patient taking differently: Take 10 mg by mouth 2 (two) times daily.  06/19/18  Yes Ernestina Penna, MD  metoprolol succinate (TOPROL-XL) 25 MG 24 hr tablet Take 1 tablet (25 mg total) by mouth daily. Patient taking differently: Take 25 mg by mouth in the morning.  06/19/18  Yes Ernestina Penna, MD  nystatin cream (MYCOSTATIN) Apply 1 application topically See admin instructions. Apply to buttocks for 14 days 08/09/19 08/22/19 Yes [provider]  oxyCODONE (OXY IR/ROXICODONE) 5 MG immediate release tablet Take 1 tablet (5 mg total) by mouth every 6 (six) hours as needed (for pain management). 07/24/19  Yes Danford, Earl Lites, MD  potassium chloride SA (K-DUR,KLOR-CON) 20 MEQ tablet Take 1 tablet (20 mEq total) by mouth daily. 06/19/18  Yes Ernestina Penna, MD  senna (SENOKOT) 8.6 MG TABS tablet Take 1 tablet by mouth 2 (two) times daily.    Yes [provider]  acetaminophen (TYLENOL) 325 MG tablet Take 2 tablets (650 mg total) by mouth every 6 (six) hours as needed for mild pain (or Fever >/= 101). Patient not taking: Reported on 08/18/2019 07/24/19   Alberteen Sam, MD  amiodarone (PACERONE) 200 MG tablet TAKE (1/2) TABLET DAILY. Patient not taking: Reported on 08/18/2019 06/19/18   Ernestina Penna, MD  Spacer/Aero-Holding Rudean Curt Use with symbicort inhaler BID 06/19/18   Ernestina Penna, MD    Scheduled Meds: . amiodarone  100 mg Oral Daily  . ARIPiprazole  5 mg Oral Daily  . busPIRone  5 mg Oral QHS  . levothyroxine  50 mcg Oral QAC breakfast  . memantine  5 mg Oral Daily  . mometasone-formoterol  2 puff Inhalation BID  . pantoprazole (PROTONIX) IV  40  mg Intravenous Q12H   Infusions: . sodium chloride 250 mL (08/19/19 0209)  . cefTRIAXone (ROCEPHIN)  IV Stopped (08/18/19 2326)  . metronidazole Stopped (08/19/19 1105)  . norepinephrine (LEVOPHED) Adult infusion 3 mcg/min (08/19/19 0338)   PRN Meds: acetaminophen **OR** acetaminophen, albuterol, docusate sodium, polyethylene glycol   Allergies as of 08/18/2019 - Review Complete 08/18/2019  Allergen Reaction Noted  . Simvastatin Other (See Comments) 07/12/2010  . Ace inhibitors Cough 07/12/2010    Family History  Problem Relation Age of Onset  . Heart disease Father 36  . Cancer Sister     Social History   Socioeconomic History  . Marital status: Divorced    Spouse name: Not on file  . Number of children: Not on file  . Years of education: Not on file  . Highest education level: Not on file  Occupational History  . Occupation: retired  Tobacco Use  . Smoking status: Former Smoker    Packs/day: 0.10    Years: 5.00    Pack years: 0.50    Types: Cigarettes  . Smokeless tobacco: Never Used  . Tobacco comment: pt states only smoke socially in teens  Substance and Sexual Activity  . Alcohol use: No  . Drug use: No  . Sexual activity: Not on file  Other Topics Concern  . Not on file  Social History Narrative  . Not on file   Social Determinants of Health   Financial Resource Strain:   . Difficulty of Paying Living Expenses:   Food Insecurity:   . Worried About Programme researcher, broadcasting/film/video in the Last Year:   . Barista in the Last Year:   Transportation Needs:   . Freight forwarder (Medical):   Marland Kitchen Lack of Transportation (Non-Medical):   Physical Activity:   . Days of Exercise per Week:   . Minutes of Exercise per Session:   Stress:   . Feeling of Stress :   Social Connections:   . Frequency of Communication with Friends and Family:   . Frequency of Social Gatherings with Friends and Family:   . Attends Religious Services:   . Active Member of Clubs or  Organizations:   . Attends Banker Meetings:   Marland Kitchen Marital Status:   Intimate Partner Violence:   . Fear of Current or Ex-Partner:   . Emotionally Abused:   Marland Kitchen Physically Abused:   . Sexually Abused:     REVIEW OF SYSTEMS: Constitutional: Weakness, fatigue. ENT:  No nose bleeds Pulm: No shortness of breath or cough CV:  No palpitations, no LE edema.  No chest pain GU:  No hematuria, no frequency GI: See HPI. Heme: Patient denies unusual or excessive bleeding or bruising Transfusions: None Neuro:  No headaches, no peripheral tingling or numbness Derm:  No itching, no rash or sores.  Endocrine:  No sweats or chills.  No polyuria or dysuria Immunization: Not queried Travel:  None beyond local counties in last few months.    PHYSICAL EXAM: Vital signs in last 24 hours: Vitals:   08/19/19 1100 08/19/19 1130  BP: (!) 120/46 (!) 122/49  Pulse: 60 60  Resp: 11 10  Temp:    SpO2: 100% 100%   Wt Readings from Last 3 Encounters:  08/18/19 63.5 kg  07/22/19 63.3 kg  07/21/19 63.5 kg    General: Frail, aged, comfortable, alert Head: No facial asymmetry or swelling.  No signs of head trauma. Eyes: Conjunctiva pale.  No scleral icterus. Ears: Slightly HOH Nose: No discharge or congestion Mouth: Dentition in poor repair, tongue midline.  Mucosa pink, moist, clear. Neck: No JVD, no masses, no thyromegaly Lungs: Clear bilaterally without labored breathing or cough Heart: RRR.  No MRG.  S1, S2 present Abdomen: Soft, nondistended, nontender.  No HSM, masses, bruits, hernias..   Rectal: Deferred.  FOBT positive specimen described as melena per ED exam.   Musc/Skeltl: No joint redness or swelling.  Overall osteoporotic appearance Extremities: No CCE Neurologic: Oriented  to "hospital", self.  Not oriented to date/year/time or reason for being at the hospital Skin: Some purpura on the arms, Tattoos: None Nodes: No cervical adenopathy Psych: Cooperative, pleasant,  calm.  Intake/Output from previous day: 04/24 0701 - 04/25 0700 In: 3627.5 [I.V.:27.4; IV Piggyback:3600.1] Out: 850 [Urine:850] Intake/Output this shift: No intake/output data recorded.  LAB RESULTS: Recent Labs    08/18/19 1645 08/19/19 0154 08/19/19 0745  WBC 13.0* 9.8 13.0*  HGB 11.1* 9.0* 10.9*  HCT 34.2* 27.7* 34.6*  PLT 227 152 229   BMET Lab Results  Component Value Date   NA 142 08/19/2019   NA 137 08/18/2019   NA 134 (L) 07/24/2019   K 3.8 08/19/2019   K 4.4 08/18/2019   K 4.0 07/24/2019   CL 119 (H) 08/19/2019   CL 108 08/18/2019   CL 99 07/24/2019   CO2 15 (L) 08/19/2019   CO2 21 (L) 08/18/2019   CO2 24 07/24/2019   GLUCOSE 79 08/19/2019   GLUCOSE 105 (H) 08/18/2019   GLUCOSE 86 07/24/2019   BUN 76 (H) 08/19/2019   BUN 119 (H) 08/18/2019   BUN 16 07/24/2019   CREATININE 1.94 (H) 08/19/2019   CREATININE 2.97 (H) 08/18/2019   CREATININE 1.07 (H) 07/24/2019   CALCIUM 7.8 (L) 08/19/2019   CALCIUM 8.3 (L) 08/18/2019   CALCIUM 8.4 (L) 07/24/2019   LFT Recent Labs    08/18/19 1645  PROT 4.5*  ALBUMIN 2.3*  AST 31  ALT 25  ALKPHOS 88  BILITOT 0.7   PT/INR Lab Results  Component Value Date   INR 1.1 07/22/2019   INR 0.93 03/31/2009   INR 1.0 02/08/2007   Hepatitis Panel No results for input(s): HEPBSAG, HCVAB, HEPAIGM, HEPBIGM in the last 72 hours. C-Diff No components found for: CDIFF Lipase  No results found for: LIPASE  Drugs of Abuse  No results found for: LABOPIA, COCAINSCRNUR, LABBENZ, AMPHETMU, THCU, LABBARB   RADIOLOGY STUDIES: CT Head Wo Contrast  Result Date: 08/18/2019 CLINICAL DATA:  Fatigue, hypotension, cerebral hemorrhage suspected EXAM: CT HEAD WITHOUT CONTRAST TECHNIQUE: Contiguous axial images were obtained from the base of the skull through the vertex without intravenous contrast. COMPARISON:  07/22/2019 FINDINGS: Brain: No evidence of acute infarction, hemorrhage, hydrocephalus, extra-axial collection or mass  lesion/mass effect. Periventricular white matter hypodensity and global volume loss. Mega cisterna magna variant of the posterior fossa. Vascular: No hyperdense vessel or unexpected calcification. Skull: Normal. Negative for fracture or focal lesion. Sinuses/Orbits: No acute finding. Other: None. IMPRESSION: No acute intracranial pathology.  Small-vessel white matter disease. Electronically Signed   By: Eddie Candle M.D.   On: 08/18/2019 17:58   US RENAL  Result Date: 08/19/2019 CLINICAL DATA:  Acute renal injury. EXAM: RENAL / URINARY TRACT ULTRASOUND COMPLETE COMPARISON:  None. FINDINGS: Right Kidney: Renal measurements: 10.5 x 4.1 x 5.0 cm. There is a small amount of right perinephric fluid. Left Kidney: Renal measurements: 9.7 x 4.0 x 4.3 cm. Echogenicity within normal limits. No mass or hydronephrosis visualized. Bladder: Appears normal for degree of bladder distention. Other: None. IMPRESSION: 1. There is a small amount of right perinephric fluid which is nonspecific. No other abnormalities identified. Electronically Signed   By: Dorise Bullion III M.D   On: 08/19/2019 12:25   DG Chest Port 1 View  Result Date: 08/18/2019 CLINICAL DATA:  86 year old female with weakness, hypotension. Evaluate for pneumonia. EXAM: PORTABLE CHEST 1 VIEW COMPARISON:  Prior chest x-ray 07/22/2019 FINDINGS: Stable cardiac and mediastinal contours. Atherosclerotic calcifications  present throughout the aorta. No focal airspace opacity to suggest pneumonia. Chronic bronchitic changes remain stable. Minimal right apical pleuroparenchymal scarring. Evidence of prior bilateral rotator cuff repair. No acute osseous abnormality. IMPRESSION: No active disease. Electronically Signed   By: Heath  McCullough M.D.   On: 08/18/2019 15:29   CT Renal Stone Study  Result Date: 08/18/2019 CLINICAL DATA:  Pro flank pain, hypotension EXAM: CT ABDOMEN AND PELVIS WITHOUT CONTRAST TECHNIQUE: Multidetector CT imaging of the abdomen and pelvis  was performed following the standard protocol without IV contrast. COMPARISON:  None. FINDINGS: Lower chest: No acute abnormality.  Coronary artery calcifications. Hepatobiliary: No solid liver abnormality is seen. No gallstones, gallbladder wall thickening, or biliary dilatation. Pancreas: Unremarkable. No pancreatic ductal dilatation or surrounding inflammatory changes. Spleen: Normal in size without significant abnormality. Adrenals/Urinary Tract: Adrenal glands are unremarkable. Kidneys are normal, without renal calculi, solid lesion, or hydronephrosis. Bladder is unremarkable. Stomach/Bowel: Stomach is within normal limits. Appendix appears normal. There is inflammatory stranding about the transverse and ascending portions of the duodenum (series 3, image 30). Sigmoid diverticulosis. Vascular/Lymphatic: Aortic atherosclerosis. No enlarged abdominal or pelvic lymph nodes. Reproductive: No mass or other significant abnormality. Other: No abdominal wall hernia or abnormality. No abdominopelvic ascites. Musculoskeletal: No acute or significant osseous findings. Status post right hip total arthroplasty. IMPRESSION: 1. Inflammatory stranding about the transverse and ascending portions of the duodenum, consistent with infectious or inflammatory enteritis. 2.  No evidence of urinary tract calculus or hydronephrosis. 3.  Coronary artery disease.  Aortic Atherosclerosis (ICD10-I70.0). Electronically Signed   By: Alex  Bibbey M.D.   On: 08/18/2019 18:03     IMPRESSION:   *    GI bleed.  CT suggests duodenal inflammation. She is been started on Protonix 40 IV bid, Flagyl and Rocephin IV.  NPO.    PLAN:     *    EGD tomorrow.  Orders placed.  Niece Linda Rhodes [phone]. Spoke w her and she is ok w procedure.     Sarah Gribbin  08/19/2019, 12:29 PM Phone [phone]   Attending physician's note   I have taken an interval history, reviewed the chart and examined the patient. I agree with the  Advanced Practitioner's note, impression and recommendations.   86yr old with dementia, GERD with HH, CHF, A Fib (on amiodarone, no AC), AKI (Cr 2.9 to 1.9) d/t ?overdiuresis with melena. Recent N/V. HD stable. Hb 10.9 (baseline). NCCT showing duodenal inflammation.  Plan: -IV protonix -Full liquid diet. -EGD in AM.  I have discussed risks and benefits with the patient and patient's niece was very involved in the care. -? Adjust amiodarone if continued N/V -medicine svc to determine. -Trend CBC.  Raj Rhys Anchondo, MD [facility] [phone].      

## 2019-08-19 NOTE — ED Notes (Signed)
[place] tech requesting pt to go to [place], tech oriented that do to unstable BP at this time to hold for now.

## 2019-08-19 NOTE — ED Notes (Signed)
Pt's BP continues to be low, admitting provider notified, awaiting for further orders.

## 2019-08-19 NOTE — Plan of Care (Signed)
86 year old female admitted this AM with GI Bleed. Noted to have AKI and Hypotension. Given 3L Bolus and started on Levophed gtt. GI consulted. Plans for EGD in AM. NPO at MN. Added PPI BID. Currently on 3 mcg/min levophed. Repeat CBC and Lactic acid pending.

## 2019-08-19 NOTE — Progress Notes (Signed)
eLink Physician-Brief Progress Note Patient Name: [name] DOB: July 03, 1924 MRN: [id number]   Date of Service  08/19/2019  HPI/Events of Note  Pt has wound dehiscence from a recent hip surgery.  eICU Interventions  Bedside RN asked to notify orthopedic surgeon who did the hip surgery in a.m. that patient is in the hospital and has a dehiscence of the surgical wound.        Thomasene Lot Theressa Piedra 08/19/2019, 10:28 PM

## 2019-08-20 ENCOUNTER — Inpatient Hospital Stay (HOSPITAL_COMMUNITY): Payer: Medicare Other | Admitting: Certified Registered"

## 2019-08-20 ENCOUNTER — Encounter (HOSPITAL_COMMUNITY)
Admission: EM | Disposition: A | Discharge status: Skilled Nursing Facility | Payer: Self-pay | Source: Home / Self Care | Attending: Internal Medicine

## 2019-08-20 ENCOUNTER — Encounter (HOSPITAL_COMMUNITY): Payer: Self-pay | Admitting: Critical Care Medicine

## 2019-08-20 ENCOUNTER — Inpatient Hospital Stay (HOSPITAL_COMMUNITY): Payer: Medicare Other

## 2019-08-20 DIAGNOSIS — K529 Noninfective gastroenteritis and colitis, unspecified: Secondary | ICD-10-CM | POA: Diagnosis not present

## 2019-08-20 DIAGNOSIS — I482 Chronic atrial fibrillation, unspecified: Secondary | ICD-10-CM | POA: Diagnosis not present

## 2019-08-20 DIAGNOSIS — K2970 Gastritis, unspecified, without bleeding: Secondary | ICD-10-CM

## 2019-08-20 DIAGNOSIS — R578 Other shock: Secondary | ICD-10-CM

## 2019-08-20 DIAGNOSIS — K269 Duodenal ulcer, unspecified as acute or chronic, without hemorrhage or perforation: Secondary | ICD-10-CM

## 2019-08-20 DIAGNOSIS — K2980 Duodenitis without bleeding: Secondary | ICD-10-CM

## 2019-08-20 DIAGNOSIS — K922 Gastrointestinal hemorrhage, unspecified: Secondary | ICD-10-CM | POA: Diagnosis not present

## 2019-08-20 DIAGNOSIS — N179 Acute kidney failure, unspecified: Secondary | ICD-10-CM | POA: Diagnosis not present

## 2019-08-20 DIAGNOSIS — K259 Gastric ulcer, unspecified as acute or chronic, without hemorrhage or perforation: Secondary | ICD-10-CM

## 2019-08-20 DIAGNOSIS — M009 Pyogenic arthritis, unspecified: Secondary | ICD-10-CM

## 2019-08-20 HISTORY — PX: ESOPHAGOGASTRODUODENOSCOPY (EGD) WITH PROPOFOL: SHX5813

## 2019-08-20 HISTORY — PX: HEMOSTASIS CLIP PLACEMENT: SHX6857

## 2019-08-20 HISTORY — PX: BIOPSY: SHX5522

## 2019-08-20 LAB — CBC
HCT: 32.5 % — ABNORMAL LOW (ref 36.0–46.0)
HCT: 31.8 % — ABNORMAL LOW (ref 36.0–46.0)
Hemoglobin: 10.6 g/dL — ABNORMAL LOW (ref 12.0–15.0)
Hemoglobin: 10.2 g/dL — ABNORMAL LOW (ref 12.0–15.0)
MCH: 28.2 pg (ref 26.0–34.0)
MCH: 28.3 pg (ref 26.0–34.0)
MCHC: 32.6 g/dL (ref 30.0–36.0)
MCHC: 32.1 g/dL (ref 30.0–36.0)
MCV: 86.4 fL (ref 80.0–100.0)
MCV: 88.1 fL (ref 80.0–100.0)
Platelets: 252 10*3/uL (ref 150–400)
Platelets: 193 10*3/uL (ref 150–400)
RBC: 3.76 MIL/uL — ABNORMAL LOW (ref 3.87–5.11)
RBC: 3.61 MIL/uL — ABNORMAL LOW (ref 3.87–5.11)
RDW: 16.6 % — ABNORMAL HIGH (ref 11.5–15.5)
RDW: 16.7 % — ABNORMAL HIGH (ref 11.5–15.5)
WBC: 13 10*3/uL — ABNORMAL HIGH (ref 4.0–10.5)
WBC: 10.2 10*3/uL (ref 4.0–10.5)
nRBC: 0 % (ref 0.0–0.2)
nRBC: 0 % (ref 0.0–0.2)

## 2019-08-20 LAB — BASIC METABOLIC PANEL
Anion gap: 8 (ref 5–15)
Anion gap: 10 (ref 5–15)
BUN: 51 mg/dL — ABNORMAL HIGH (ref 8–23)
BUN: 36 mg/dL — ABNORMAL HIGH (ref 8–23)
CO2: 17 mmol/L — ABNORMAL LOW (ref 22–32)
CO2: 17 mmol/L — ABNORMAL LOW (ref 22–32)
Calcium: 8.3 mg/dL — ABNORMAL LOW (ref 8.9–10.3)
Calcium: 8 mg/dL — ABNORMAL LOW (ref 8.9–10.3)
Chloride: 118 mmol/L — ABNORMAL HIGH (ref 98–111)
Chloride: 114 mmol/L — ABNORMAL HIGH (ref 98–111)
Creatinine, Ser: 1.65 mg/dL — ABNORMAL HIGH (ref 0.44–1.00)
Creatinine, Ser: 1.53 mg/dL — ABNORMAL HIGH (ref 0.44–1.00)
GFR calc Af Amer: 31 mL/min — ABNORMAL LOW (ref >60)
GFR calc Af Amer: 34 mL/min — ABNORMAL LOW (ref >60)
GFR calc non Af Amer: 27 mL/min — ABNORMAL LOW (ref >60)
GFR calc non Af Amer: 29 mL/min — ABNORMAL LOW (ref >60)
Glucose, Bld: 90 mg/dL (ref 70–99)
Glucose, Bld: 103 mg/dL — ABNORMAL HIGH (ref 70–99)
Potassium: 3.7 mmol/L (ref 3.5–5.1)
Potassium: 3.1 mmol/L — ABNORMAL LOW (ref 3.5–5.1)
Sodium: 143 mmol/L (ref 135–145)
Sodium: 141 mmol/L (ref 135–145)

## 2019-08-20 LAB — LACTIC ACID, PLASMA
Lactic Acid, Venous: 1.6 mmol/L (ref 0.5–1.9)
Lactic Acid, Venous: 2.5 mmol/L (ref 0.5–1.9)

## 2019-08-20 LAB — GASTROINTESTINAL PANEL BY PCR, STOOL (REPLACES STOOL CULTURE)

## 2019-08-20 LAB — C-REACTIVE PROTEIN: CRP: 6.8 mg/dL — ABNORMAL HIGH (ref <1.0)

## 2019-08-20 LAB — PROTIME-INR
INR: 1.3 — ABNORMAL HIGH (ref 0.8–1.2)
Prothrombin Time: 16.1 seconds — ABNORMAL HIGH (ref 11.4–15.2)

## 2019-08-20 LAB — MAGNESIUM: Magnesium: 1.4 mg/dL — ABNORMAL LOW (ref 1.7–2.4)

## 2019-08-20 LAB — PHOSPHORUS: Phosphorus: 3.1 mg/dL (ref 2.5–4.6)

## 2019-08-20 LAB — SURGICAL PCR SCREEN
MRSA, PCR: NEGATIVE
Staphylococcus aureus: NEGATIVE

## 2019-08-20 LAB — SEDIMENTATION RATE: Sed Rate: 2 mm/hr (ref 0–22)

## 2019-08-20 LAB — APTT: aPTT: 44 seconds — ABNORMAL HIGH (ref 24–36)

## 2019-08-20 LAB — PREPARE RBC (CROSSMATCH)

## 2019-08-20 SURGERY — ESOPHAGOGASTRODUODENOSCOPY (EGD) WITH PROPOFOL
Anesthesia: Monitor Anesthesia Care

## 2019-08-20 MED ORDER — ACETAMINOPHEN 325 MG PO TABS: 650.0000 mg | ORAL_TABLET | Freq: Once | ORAL | Status: DC

## 2019-08-20 MED ORDER — LACTATED RINGERS IV BOLUS
1000.0000 mL | Freq: Once | INTRAVENOUS | Status: AC
  Administered 2019-08-20: 10:00:00 1000 mL via INTRAVENOUS

## 2019-08-20 MED ORDER — MUPIROCIN 2 % EX OINT
1.0000 "application " | TOPICAL_OINTMENT | Freq: Two times a day (BID) | CUTANEOUS | Status: DC
  Administered 2019-08-20 (×2): 1 via NASAL
  Filled 2019-08-20 (×2): qty 22

## 2019-08-20 MED ORDER — MAGIC MOUTHWASH
5.0000 mL | Freq: Three times a day (TID) | ORAL | Status: DC
  Administered 2019-08-20 – 2019-08-24 (×8): 5 mL via ORAL
  Filled 2019-08-20 (×11): qty 5

## 2019-08-20 MED ORDER — SODIUM CHLORIDE 0.9 % IV SOLN
100.0000 mg | Freq: Two times a day (BID) | INTRAVENOUS | Status: DC
  Administered 2019-08-20 – 2019-08-21 (×3): 100 mg via INTRAVENOUS
  Filled 2019-08-20 (×4): qty 100

## 2019-08-20 MED ORDER — SODIUM CHLORIDE 0.9% IV SOLUTION: Freq: Once | INTRAVENOUS | Status: AC

## 2019-08-20 MED ORDER — POTASSIUM CHLORIDE 20 MEQ/15ML (10%) PO SOLN
20.0000 meq | ORAL | Status: AC
  Administered 2019-08-20 – 2019-08-21 (×2): 20 meq via ORAL
  Filled 2019-08-20 (×2): qty 15

## 2019-08-20 MED ORDER — LACTATED RINGERS IV SOLN: INTRAVENOUS | Status: DC | PRN

## 2019-08-20 MED ORDER — DOXYCYCLINE HYCLATE 100 MG PO TABS: 100.0000 mg | ORAL_TABLET | Freq: Two times a day (BID) | ORAL | Status: DC

## 2019-08-20 MED ORDER — PROPOFOL 500 MG/50ML IV EMUL
INTRAVENOUS | Status: DC | PRN
  Administered 2019-08-20: 125 ug/kg/min via INTRAVENOUS

## 2019-08-20 SURGICAL SUPPLY — 15 items

## 2019-08-20 NOTE — Anesthesia Postprocedure Evaluation (Signed)
Anesthesia Post Note  Patient: [place]  Procedure(s) Performed: ESOPHAGOGASTRODUODENOSCOPY (EGD) WITH PROPOFOL (N/A ) BIOPSY HEMOSTASIS CLIP PLACEMENT     Patient location during evaluation: PACU Anesthesia Type: MAC Level of consciousness: awake and alert Pain management: pain level controlled Vital Signs Assessment: post-procedure vital signs reviewed and stable Respiratory status: spontaneous breathing, nonlabored ventilation and respiratory function stable Cardiovascular status: stable and blood pressure returned to baseline Postop Assessment: no apparent nausea or vomiting Anesthetic complications: no    Last Vitals:  Vitals:   08/20/19 1445 08/20/19 1455  BP: (!) 150/36 (!) 133/50  Pulse: (!) 56 63  Resp: 11 17  Temp:    SpO2: 95% 98%    Last Pain:  Vitals:   08/20/19 1455  TempSrc:   PainSc: 0-No pain                 Allea Kassner A.

## 2019-08-20 NOTE — Progress Notes (Signed)
eLink Physician-Brief Progress Note Patient Name: [name] DOB: 30-Nov-1924 MRN: [id number]   Date of Service  08/20/2019  HPI/Events of Note  K+ 3.1  eICU Interventions  E-link electrolyte replacement protocol orders placed.        Thomasene Lot Kahiau Schewe 08/20/2019, 11:02 PM

## 2019-08-20 NOTE — Consult Note (Signed)
Reason for Consult: right thigh draining wound following ORIF right distal peri-prosthetic femur fracture Referring Physician: Chestine Spore, DO  [name] is an 86 y.o. female.  HPI: History of right total knee replacement complicated by recent fall at the beginning of March resulting in distal femur peri-prosthetic fracture.  She underwent an ORIF then at [facility] facility - Dr. Case. Nursing home resident Dementia Admitted with dehydration, anemia and now found to have a GI bleed  Noted right thigh wound dehiscence with drainage Ortho consulted   Past Medical History:  Diagnosis Date  . Abdominal pain   . Atrophic vaginitis   . Cellulitis 06/2019  . CHF (congestive heart failure) (HCC)   . Colon polyps    Dr Ewing Schlein  . Diverticulosis    Dr Ewing Schlein  . Essential hypertension, benign   . Gastritis   . Hiatal hernia   . Insomnia, idiopathic   . Malaise and fatigue   . Osteoarthrosis and allied disorders   . Other and unspecified hyperlipidemia   . Thoracic scoliosis     Past Surgical History:  Procedure Laterality Date  . JOINT REPLACEMENT    . left shoulder surgery     . rt knee   10 1 08   Dr Thomasena Edis  . Rt rotator  cuff repair  4 1 00  . TOTAL HIP ARTHROPLASTY Right 2021  ?    Family History  Problem Relation Age of Onset  . Heart disease Father 54  . Cancer Sister     Social History:  reports that she has quit smoking. Her smoking use included cigarettes. She has a 0.50 pack-year smoking history. She has never used smokeless tobacco. She reports that she does not drink alcohol or use drugs.  Allergies:  Allergies  Allergen Reactions  . Simvastatin Other (See Comments)    Leg pain- "Allergic," per MAR  . Ace Inhibitors Cough    "Allergic," per MAR    Medications:  I have reviewed the patient's current medications. Scheduled: . acetaminophen  650 mg Oral Once  . amiodarone  100 mg Oral Daily  . ARIPiprazole  5 mg Oral Daily  . busPIRone  5 mg Oral QHS  .  Chlorhexidine Gluconate Cloth  6 each Topical Daily  . levothyroxine  50 mcg Oral QAC breakfast  . magic mouthwash  5 mL Oral TID  . memantine  5 mg Oral Daily  . mometasone-formoterol  2 puff Inhalation BID  . mupirocin ointment  1 application Nasal BID  . pantoprazole (PROTONIX) IV  40 mg Intravenous Q12H    Results for orders placed or performed during the hospital encounter of 08/18/19 (from the past 24 hour(s))  CBC     Status: Abnormal   Collection Time: 08/20/19  3:32 AM  Result Value Ref Range   WBC 13.0 (H) 4.0 - 10.5 K/uL   RBC 3.76 (L) 3.87 - 5.11 MIL/uL   Hemoglobin 10.6 (L) 12.0 - 15.0 g/dL   HCT 56.3 (L) 89.3 - 73.4 %   MCV 86.4 80.0 - 100.0 fL   MCH 28.2 26.0 - 34.0 pg   MCHC 32.6 30.0 - 36.0 g/dL   RDW 28.7 (H) 68.1 - 15.7 %   Platelets 252 150 - 400 K/uL   nRBC 0.0 0.0 - 0.2 %  Basic metabolic panel     Status: Abnormal   Collection Time: 08/20/19  3:32 AM  Result Value Ref Range   Sodium 143 135 - 145 mmol/L   Potassium  3.7 3.5 - 5.1 mmol/L   Chloride 118 (H) 98 - 111 mmol/L   CO2 17 (L) 22 - 32 mmol/L   Glucose, Bld 90 70 - 99 mg/dL   BUN 51 (H) 8 - 23 mg/dL   Creatinine, Ser 3.41 (H) 0.44 - 1.00 mg/dL   Calcium 8.3 (L) 8.9 - 10.3 mg/dL   GFR calc non Af Amer 27 (L) >60 mL/min   GFR calc Af Amer 31 (L) >60 mL/min   Anion gap 8 5 - 15  Magnesium     Status: Abnormal   Collection Time: 08/20/19  3:32 AM  Result Value Ref Range   Magnesium 1.4 (L) 1.7 - 2.4 mg/dL  Phosphorus     Status: None   Collection Time: 08/20/19  3:32 AM  Result Value Ref Range   Phosphorus 3.1 2.5 - 4.6 mg/dL  Gastrointestinal Panel by PCR , Stool     Status: None   Collection Time: 08/20/19  4:09 AM  Result Value Ref Range   Campylobacter species NOT DETECTED NOT DETECTED   Plesimonas shigelloides NOT DETECTED NOT DETECTED   Salmonella species NOT DETECTED NOT DETECTED   Yersinia enterocolitica NOT DETECTED NOT DETECTED   Vibrio species NOT DETECTED NOT DETECTED   Vibrio  cholerae NOT DETECTED NOT DETECTED   Enteroaggregative E coli (EAEC) NOT DETECTED NOT DETECTED   Enteropathogenic E coli (EPEC) NOT DETECTED NOT DETECTED   Enterotoxigenic E coli (ETEC) NOT DETECTED NOT DETECTED   Shiga like toxin producing E coli (STEC) NOT DETECTED NOT DETECTED   Shigella/Enteroinvasive E coli (EIEC) NOT DETECTED NOT DETECTED   Cryptosporidium NOT DETECTED NOT DETECTED   Cyclospora cayetanensis NOT DETECTED NOT DETECTED   Entamoeba histolytica NOT DETECTED NOT DETECTED   Giardia lamblia NOT DETECTED NOT DETECTED   Adenovirus F40/41 NOT DETECTED NOT DETECTED   Astrovirus NOT DETECTED NOT DETECTED   Norovirus GI/GII NOT DETECTED NOT DETECTED   Rotavirus A NOT DETECTED NOT DETECTED   Sapovirus (I, II, IV, and V) NOT DETECTED NOT DETECTED  CBC     Status: Abnormal   Collection Time: 08/20/19  8:23 AM  Result Value Ref Range   WBC 10.2 4.0 - 10.5 K/uL   RBC 3.61 (L) 3.87 - 5.11 MIL/uL   Hemoglobin 10.2 (L) 12.0 - 15.0 g/dL   HCT 96.2 (L) 22.9 - 79.8 %   MCV 88.1 80.0 - 100.0 fL   MCH 28.3 26.0 - 34.0 pg   MCHC 32.1 30.0 - 36.0 g/dL   RDW 92.1 (H) 19.4 - 17.4 %   Platelets 193 150 - 400 K/uL   nRBC 0.0 0.0 - 0.2 %  Prepare RBC (crossmatch)     Status: None   Collection Time: 08/20/19  9:17 AM  Result Value Ref Range   Order Confirmation      ORDER PROCESSED BY BLOOD BANK Performed at [facility], [address], [place], [place] [address]   Protime-INR     Status: Abnormal   Collection Time: 08/20/19  9:35 AM  Result Value Ref Range   Prothrombin Time 16.1 (H) 11.4 - 15.2 seconds   INR 1.3 (H) 0.8 - 1.2  APTT     Status: Abnormal   Collection Time: 08/20/19  9:35 AM  Result Value Ref Range   aPTT 44 (H) 24 - 36 seconds  Lactic acid, plasma     Status: None   Collection Time: 08/20/19  9:35 AM  Result Value Ref Range  Lactic Acid, Venous 1.6 0.5 - 1.9 mmol/L  Sedimentation rate     Status: None   Collection Time: 08/20/19  9:35 AM  Result Value  Ref Range   Sed Rate 2 0 - 22 mm/hr  C-reactive protein     Status: Abnormal   Collection Time: 08/20/19  9:35 AM  Result Value Ref Range   CRP 6.8 (H) <1.0 mg/dL  Aerobic/Anaerobic Culture (surgical/deep wound)     Status: None (Preliminary result)   Collection Time: 08/20/19 11:00 AM   Specimen: Wound  Result Value Ref Range   Specimen Description WOUND    Special Requests NONE    Gram Stain      NO WBC SEEN NO ORGANISMS SEEN Performed at [facility], [address], [place], [place] [address]    Culture PENDING    Report Status PENDING   Surgical PCR screen     Status: None   Collection Time: 08/20/19 11:32 AM   Specimen: Nasal Mucosa; Nasal Swab  Result Value Ref Range   MRSA, PCR NEGATIVE NEGATIVE   Staphylococcus aureus NEGATIVE NEGATIVE  Lactic acid, plasma     Status: Abnormal   Collection Time: 08/20/19  6:54 PM  Result Value Ref Range   Lactic Acid, Venous 2.5 (HH) 0.5 - 1.9 mmol/L  Basic metabolic panel     Status: Abnormal   Collection Time: 08/20/19  6:54 PM  Result Value Ref Range   Sodium 141 135 - 145 mmol/L   Potassium 3.1 (L) 3.5 - 5.1 mmol/L   Chloride 114 (H) 98 - 111 mmol/L   CO2 17 (L) 22 - 32 mmol/L   Glucose, Bld 103 (H) 70 - 99 mg/dL   BUN 36 (H) 8 - 23 mg/dL   Creatinine, Ser 3.66 (H) 0.44 - 1.00 mg/dL   Calcium 8.0 (L) 8.9 - 10.3 mg/dL   GFR calc non Af Amer 29 (L) >60 mL/min   GFR calc Af Amer 34 (L) >60 mL/min   Anion gap 10 5 - 15    X-ray: CLINICAL DATA:  History of total knee replacement  EXAM: RIGHT KNEE - 1-2 VIEW  COMPARISON:  06/28/2019  FINDINGS: Changes of right knee replacement and plate and screw fixation in the distal right femur. Stable appearance since prior study. Distal femoral periprostatic fracture again partially imaged with evidence of some healing and callus. No acute fracture, subluxation or dislocation.  IMPRESSION: Stable hardware appearance. No acute bony abnormality. Healing distal femoral  fracture.   Electronically Signed   By: Charlett Nose M.D.  CLINICAL DATA:  Right hip replacement. Total right knee replacement.  EXAM: RIGHT FEMUR 1 VIEW  COMPARISON:  Knee series 06/28/2019, hip series 06/21/2019  FINDINGS: changes of right hip replacement and right knee replacement. Plate and screw fixation device noted in the distal right femur. Hardware is stable when compared to prior studies. No hardware complicating feature. No acute bony abnormality. No acute fracture, subluxation or dislocation. Probable old right inferior pubic ramus fracture, stable since prior study.  IMPRESSION: Prior right hip replacement and right knee replacement as well as plate and screw fixation in the distal right femur. No change since prior studies. No acute bony abnormality.   Electronically Signed   By: Charlett Nose M.D.   ROS  As per HPI  Blood pressure (!) 126/51, pulse (!) 58, temperature 97.7 F (36.5 C), temperature source Temporal, resp. rate 10, height 5\' 6"  (1.676 m), weight 57.7 kg, SpO2 100 %.  Physical Exam: Awake conversant  Right distal lateral thigh wound with 5-6 cm dehiscence with tunneling to lateral aspect of knee Mild serosanguinous drainage, no obvious purulence   General: Frail, aged, comfortable, alert Head: No facial asymmetry or swelling.  No signs of head trauma. Eyes: Conjunctiva pale.  No scleral icterus. Ears: Slightly HOH Nose: No discharge or congestion Mouth: Dentition in poor repair, tongue midline.  Mucosa pink, moist, clear. Neck: No JVD, no masses, no thyromegaly Lungs: Clear bilaterally without labored breathing or cough Heart: RRR.  No MRG.  S1, S2 present Abdomen: Soft, nondistended, nontender.  No HSM, masses, bruits, hernias..   Rectal: Deferred.  FOBT positive specimen described as melena per ED exam.   Musc/Skeltl: see above re right distal thigh Extremities: No CCE Neurologic: Oriented to "hospital", self.  Not oriented  to date/year/time or reason for being at the hospital Skin: Some purpura on the arms, Tattoos: None Nodes: No cervical adenopathy Psych: Cooperative, pleasant, calm.  Assessment/Plan: Status post right distal periprosthetic femur fracture with wound dehiscence that appears subacute - no signs of infection  To OR 08/21/19 for I&D of right distal thigh wound with intended primary closure Post op  Orders to follow  Mauri Pole 08/20/2019, 9:36 PM

## 2019-08-20 NOTE — Interval H&P Note (Signed)
History and Physical Interval Note:  08/20/2019 1:54 PM  [name]  has presented today for surgery, with the diagnosis of Melena.  Duodenal inflammation on CT.  Abdominal pain..  The various methods of treatment have been discussed with the patient and family. After consideration of risks, benefits and other options for treatment, the patient has consented to  Procedure(s): ESOPHAGOGASTRODUODENOSCOPY (EGD) WITH PROPOFOL (N/A) as a surgical intervention.  The patient's history has been reviewed, patient examined, no change in status, stable for surgery.  I have reviewed the patient's chart and labs.  Questions were answered to the patient's satisfaction.     Verlin Dike Monserath Neff

## 2019-08-20 NOTE — Transfer of Care (Signed)
Immediate Anesthesia Transfer of Care Note  Patient: [name]  Procedure(s) Performed: ESOPHAGOGASTRODUODENOSCOPY (EGD) WITH PROPOFOL (N/A ) BIOPSY HEMOSTASIS CLIP PLACEMENT  Patient Location: PACU  Anesthesia Type:MAC  Level of Consciousness: sedated and responds to stimulation  Airway & Oxygen Therapy: Patient Spontanous Breathing and Patient connected to nasal cannula oxygen  Post-op Assessment: Report given to RN, Post -op Vital signs reviewed and stable and Patient moving all extremities  Post vital signs: Reviewed and stable  Last Vitals:  Vitals Value Taken Time  BP 97/35 08/20/19 1435  Temp    Pulse 60 08/20/19 1435  Resp 15 08/20/19 1435  SpO2 100 % 08/20/19 1435  Vitals shown include unvalidated device data.  Last Pain:  Vitals:   08/20/19 1345  TempSrc: Oral  PainSc: 0-No pain         Complications: No apparent anesthesia complications

## 2019-08-20 NOTE — Anesthesia Preprocedure Evaluation (Signed)
Anesthesia Evaluation  Patient identified by MRN, date of birth, ID band Patient awake    Reviewed: Allergy & Precautions, NPO status , Patient's Chart, lab work & pertinent test results, reviewed documented beta blocker date and time   Airway Mallampati: II  TM Distance: >3 FB Neck ROM: Full    Dental no notable dental hx.    Pulmonary shortness of breath and with exertion, asthma , former smoker,    Pulmonary exam normal breath sounds clear to auscultation       Cardiovascular hypertension, Pt. on medications and Pt. on home beta blockers +CHF  Normal cardiovascular exam+ dysrhythmias Atrial Fibrillation  Rhythm:Regular Rate:Normal     Neuro/Psych Dementia Poor memory   GI/Hepatic Neg liver ROS, hiatal hernia, GERD  Medicated and Controlled,Melena Abdominal pain Abnormal duodenum on CT   Endo/Other  Hypothyroidism Hyperlipidemia  Renal/GU Renal InsufficiencyRenal disease  negative genitourinary   Musculoskeletal   Abdominal   Peds  Hematology  (+) anemia , Anticoagulated   Anesthesia Other Findings   Reproductive/Obstetrics                             Anesthesia Physical Anesthesia Plan  ASA: III  Anesthesia Plan: MAC   Post-op Pain Management:    Induction: Intravenous  PONV Risk Score and Plan: 3 and Ondansetron, Treatment may vary due to age or medical condition and Propofol infusion  Airway Management Planned: Natural Airway and Nasal Cannula  Additional Equipment:   Intra-op Plan:   Post-operative Plan:   Informed Consent: I have reviewed the patients History and Physical, chart, labs and discussed the procedure including the risks, benefits and alternatives for the proposed anesthesia with the patient or authorized representative who has indicated his/her understanding and acceptance.     Dental advisory given  Plan Discussed with: CRNA and Surgeon  Anesthesia  Plan Comments:         Anesthesia Quick Evaluation

## 2019-08-20 NOTE — Consult Note (Signed)
WOC Nurse Consult Note: Patient receiving care in [facility] 2H11.   Reason for Consult: R leg wound dehiscence Wound type: non-healing surgical wound Pressure Injury POA: Yes/No/NA Measurement: wound measures 5 cm x 2.2 cm x 1 cm with a tunnel at 12 o'clock that measures 4 cm and one at 5 o'clock that tracks to the patella area that measures 11.1 cm Wound bed: pink with exposed tendon or ligament Drainage (amount, consistency, odor) sanginous from patellar tunnel Periwound: intact Dressing procedure/placement/frequency: Place saline moistened packing strip (Lawson 229 or 8229 if 229 is unavailable) into the tunnels at 12 and 5 o'clock, and over the main wound med. Cover with ABD pad. Monitor the wound area(s) for worsening of condition such as: Signs/symptoms of infection,  Increase in size,  Development of or worsening of odor, Development of pain, or increased pain at the affected locations.  Notify the medical team if any of these develop.  Thank you for the consult.  Discussed plan of care with the patient and bedside nurse.  WOC nurse will not follow at this time.  Please re-consult the WOC team if needed.  Helmut Muster, RN, MSN, CWOCN, CNS-BC, pager [phone]

## 2019-08-20 NOTE — Op Note (Signed)
[facility] Patient Name: [name] Procedure Date : 08/20/2019 MRN: [id number] Attending MD: Doristine Locks , MD Date of Birth: 09-22-1924 CSN: [id number] Age: 86 Admit Type: Inpatient Procedure:                Upper GI endoscopy Indications:              Heme positive stool, Melena, Abnormal CT of the GI                            tract (duodenitis) Providers:                Doristine Locks, MD, Tillie Fantasia, RN, Kandice Robinsons, Technician Referring MD:              Medicines:                Monitored Anesthesia Care Complications:            No immediate complications. Estimated Blood Loss:     Estimated blood loss was minimal. Procedure:                Pre-Anesthesia Assessment:                           - Prior to the procedure, a History and Physical                            was performed, and patient medications and                            allergies were reviewed. The patient's tolerance of                            previous anesthesia was also reviewed. The risks                            and benefits of the procedure and the sedation                            options and risks were discussed with the patient.                            All questions were answered, and informed consent                            was obtained. Prior Anticoagulants: The patient has                            taken no previous anticoagulant or antiplatelet                            agents. ASA Grade Assessment: III - A patient with  severe systemic disease. After reviewing the risks                            and benefits, the patient was deemed in                            satisfactory condition to undergo the procedure.                           After obtaining informed consent, the endoscope was                            passed under direct vision. Throughout the                            procedure, the  patient's blood pressure, pulse, and                            oxygen saturations were monitored continuously. The                            GIF-H190 ([id number] Olympus gastroscope was                            introduced through the mouth, and advanced to the                            third part of duodenum. The upper GI endoscopy was                            accomplished without difficulty. The patient                            tolerated the procedure well. Findings:      A non-obstructing and mild Schatzki ring was found in the lower third of       the esophagus.      A 3 cm hiatal hernia was present.      The upper third of the esophagus and middle third of the esophagus were       normal.      At least seven non-bleeding superficial gastric ulcers with no stigmata       of bleeding were found at the incisura and in the gastric antrum. The       largest lesion was 6 mm in largest dimension and all were clean based       and without high grade stigmata. Biopsies were taken with a cold forceps       for Helicobacter pylori testing. Estimated blood loss was minimal.      Mild inflammation characterized by erythema was found in the gastric       fundus, in the gastric body and in the gastric antrum. Additional       biopsies were taken with a cold forceps for Helicobacter pylori testing.       Estimated blood loss was minimal.      Many superficial and linear duodenal ulcers were found in the duodenal  bulb, in the first portion of the duodenum, in the second portion of the       duodenum and in the third portion of the duodenum. The largest lesion       was 5 mm in largest dimension. One with pigmented material in D3 and       another with active oozing in D2. For hemostasis, two hemostatic clips       were successfully placed (1 at each high risk site). There was no       bleeding at the end of the procedure. Biopsies were taken with a cold       forceps for histology.  Estimated blood loss was minimal. Impression:               - Non-obstructing and mild Schatzki ring.                           - 3 cm hiatal hernia.                           - Normal upper third of esophagus and middle third                            of esophagus.                           - Non-bleeding gastric ulcers with no stigmata of                            bleeding. Biopsied.                           - Gastritis. Biopsied.                           - Oozing duodenal ulcers with pigmented material.                            Clips were placed. Biopsied. Recommendation:           - Return patient to hospital ward for ongoing care.                           - Clear liquid diet.                           - Continue present medications.                           - Await pathology results.                           - Use Protonix (pantoprazole) 40 mg PO BID for 8                            weeks, then reduce to 40 mg/day for ongoing  gastric/duodenal prophylaxis.                           - If the biopsies are negative for H pylori, then                            plan for fasting serum gastrin level.                           - If H pylori positive, will plan on appropriate                            antibiotic course followed by stool Ag testing for                            eradication. Will discuss the role and utility of                            repeat EGD as outpatient based on subsequent Hg/Hct                            response, clinical evidence of rebleeding, and                            discussion with patient/family at follow-up.                            Otherwise, given age and co-morbidites, following                            clinically with periodic labs would be an                            appropriate consideration.                           - I discussed these results with the patient and                            her niece,  Gean Maidens. Procedure Code(s):        --- Professional ---                           43255, 59, Esophagogastroduodenoscopy, flexible,                            transoral; with control of bleeding, any method                           43239, Esophagogastroduodenoscopy, flexible,                            transoral; with biopsy, single or multiple Diagnosis Code(s):        --- Professional ---  K22.2, Esophageal obstruction                           K44.9, Diaphragmatic hernia without obstruction or                            gangrene                           K25.9, Gastric ulcer, unspecified as acute or                            chronic, without hemorrhage or perforation                           K29.70, Gastritis, unspecified, without bleeding                           K26.4, Chronic or unspecified duodenal ulcer with                            hemorrhage                           R19.5, Other fecal abnormalities                           K92.1, Melena (includes Hematochezia)                           R93.3, Abnormal findings on diagnostic imaging of                            other parts of digestive tract CPT copyright 2019 [employer]. All rights reserved. The codes documented in this report are preliminary and upon coder review may  be revised to meet current compliance requirements. Doristine Locks, MD 08/20/2019 2:51:11 PM Number of Addenda: 0

## 2019-08-20 NOTE — Progress Notes (Signed)
NAME:  [name], MRN:  [id number], DOB:  [birth year], LOS: 2 ADMISSION DATE:  08/18/2019, CONSULTATION DATE:  08/18/19  REFERRING MD:  Dr. Chaney Malling, CHIEF COMPLAINT:  sepsis   Brief History   86 year old presented to ED with severe hypovolemic due to an upper GI bleed. Plan for EGD today.   History of present illness   86 year old female hx CHF (no echo in chart), Afib not on AC, asthma, bronchiectasis, CKD 3, hypothyroidism, HTN, HLD , dementia and recent ORIF 06/26/2019 for periprosthetic femur fracture who presents with abdominal pain for a few days associated with melena for the last day. She resides at [facility].  She was noted to have several episodes of brbpr today.  Arrived to ED and was found to be hypotensive with BP74/55. Ct renal consistent with small bowel enteritis (transverse and ascending part of duodenum).  Ct head no intracranial abnormalities.  Hb 11, lactate 1.3.  She received 2L NS and was started on cipro and flagyl.   Recent hospital admission 07/22/19 for UTI.  D/c home on cephalexin.   Right peri-prosthetic femur fracture s/p ORIF 3/2/2. Follows at [facility].   Admitted to medicine service initially on 4/24.  Received total 3.5 L.  Continued with episode hypotension with sbp 80-90's and was transferred to the ICU for initiation of pressors.    Past Medical History  CHF, Afib  Asthma, Bronchiectasis HTN, HLD Dementia Hypothyroidism  Diverticulosis    Significant Hospital Events   4/24 Admission to hospitalist service  4/25 Developed hematochezia and hypotension not responsive to IVF and transferred to ICU for pressor support   Consults:  GI, critical care  Procedures:  None   Significant Diagnostic Tests:  4/24 Ct renal- IMPRESSION: 1. Inflammatory stranding about the transverse and ascending portions of the duodenum, consistent with infectious or inflammatory enteritis. 2.  No evidence of urinary tract calculus or  hydronephrosis. 3.  Coronary artery disease.  Aortic Atherosclerosis  4/24 CXR: FINDINGS: Stable cardiac and mediastinal contours. Atherosclerotic calcifications present throughout the aorta. No focal airspace opacity to suggest pneumonia. Chronic bronchitic changes remain stable. Minimal right apical pleuroparenchymal scarring. Evidence of prior bilateral rotator cuff repair. No acute osseous abnormality.  Micro Data:  4/24 ucx- DIPHTHEROIDS(CORYNEBACTERIUM SPECIES)   4/24 Bcx- NG @ 24hrs   Antimicrobials:  Cipro 4/24 Rocephin 4/24>  Flagyl 4/24>     Interim history/subjective:  No acute events overnight. On levo @ 2. She is complaining of thirst but otherwise no other issues. 2 BMs overnight, no hematochezia or melena per RN.   Objective   Blood pressure (!) 127/48, pulse 69, temperature 98 F (36.7 C), temperature source Oral, resp. rate 15, height 5\' 6"  (1.676 m), weight 57.7 kg, SpO2 100 %.        Intake/Output Summary (Last 24 hours) at 08/20/2019 0645 Last data filed at 08/20/2019 0600 Gross per 24 hour  Intake 1210.5 ml  Output 1475 ml  Net -264.5 ml   Filed Weights   08/18/19 1504 08/19/19 2000 08/20/19 0500  Weight: 63.5 kg 57.8 kg 57.7 kg    Examination: General: chronically ill appearing, awake, in NAD HENT:  NCAT, OP clear, dry MM Lungs: CTAB, no wheezes or crackles  Cardiovascular: RRR Abdomen: soft, NTND, normoactive bowel sounds  Extremities: warm, no edema Neuro: A&O x2, diffusely weak but able to move all extremities  Skin: RLE wound dehiscence with sinus tract and surrounding warmth, no erythema or drainage  Resolved Hospital Problem list     Assessment & Plan:   Hypovolemic shock from GIB vs sepsis  - Scheduled for EGD at 10 AM, will follow up results  - S/p 4.5L of NS but still requiring low dose levophed  - Does not appear to be adequately volume resuscitated. Dry on exam. Ordered 1L LR bolus and 1 unit of pRBCs - Continue levophed with  MAP goal > 65 - Continue ceftriaxone, flagyl for possible enteritis - Continue IV PPI  - Follow up Bcx and Ucx   Duodenal enteritis  - NPO for EGD this AM  - continue antibiotics as above   Surgical site infection Wound dehiscence - Recent ORIF for periprosthetic femur fracture repair by Dr. Case at [facility] - Wound on RLE has a sinus tract and associated with warmth  - Ordered plain films, sed rate, and CRP - Supposed to be on doxy 100  BID per [facility] ortho note from 4/21, resume  - Ortho consult    AKI on CKD 3A- likely secondary to ATN from hypotension  - Baseline Cr 1.1-1.2, today is at 1.65 (improving)  - Renal US showed nonspecific perinephric fluid  - Strict I/Os and daily weights  - Avoid nephrotoxic medications    CHF PAF  HTN HLD  - Appears dry exam. Bolusing as above. Will continue to monitor volume status  - Holding home antihypertensive meds in the setting of shock  - Continue home amiodarone   Asthma - Continue Dulera and albuterol PRN   Hypothyroidism  - TSH 4.28 in 11/2018 - Continue home levothyroxine 50 mcg QD   Best practice:  Diet: NPO Pain/Anxiety/Delirium protocol (if indicated): N/A VAP protocol (if indicated): N/A DVT prophylaxis:  scd GI prophylaxis:  PPI bid Glucose control: SSI Mobility:  Code Status: full code Family Communication:  Niece updated  Disposition: ICU   Labs   CBC: Recent Labs  Lab 08/18/19 1645 08/18/19 1645 08/19/19 0154 08/19/19 0745 08/19/19 1819 08/19/19 2107 08/20/19 0332  WBC 13.0*   < > 9.8 13.0* 12.7* 12.7* 13.0*  NEUTROABS 10.8*  --   --   --   --   --   --   HGB 11.1*   < > 9.0* 10.9* 10.3* 10.4* 10.6*  HCT 34.2*   < > 27.7* 34.6* 32.0* 32.7* 32.5*  MCV 88.4   < > 87.9 89.6 88.2 88.1 86.4  PLT 227   < > 152 229 229 242 252   < > = values in this interval not displayed.    Basic Metabolic Panel: Recent Labs  Lab 08/18/19 1645 08/19/19 0745 08/20/19 0332  NA 137 142 143  K 4.4 3.8 3.7  CL 108 119*  118*  CO2 21* 15* 17*  GLUCOSE 105* 79 90  BUN 119* 76* 51*  CREATININE 2.97* 1.94* 1.65*  CALCIUM 8.3* 7.8* 8.3*  MG  --   --  1.4*  PHOS  --   --  3.1   GFR: Estimated Creatinine Clearance: 19.8 mL/min (A) (by C-G formula based on SCr of 1.65 mg/dL (H)). Recent Labs  Lab 08/18/19 1904 08/19/19 0154 08/19/19 0745 08/19/19 1819 08/19/19 2107 08/20/19 0332  WBC  --    < > 13.0* 12.7* 12.7* 13.0*  LATICACIDVEN 1.3  --   --   --  2.2*  --    < > = values in this interval not displayed.    Liver Function Tests: Recent Labs  Lab 08/18/19 1645  AST 31  ALT 25  ALKPHOS 88  BILITOT 0.7  PROT 4.5*  ALBUMIN 2.3*   No results for input(s): LIPASE, AMYLASE in the last 168 hours. No results for input(s): AMMONIA in the last 168 hours.  ABG No results found for: PHART, PCO2ART, PO2ART, HCO3, TCO2, ACIDBASEDEF, O2SAT   Coagulation Profile: No results for input(s): INR, PROTIME in the last 168 hours.  Cardiac Enzymes: No results for input(s): CKTOTAL, CKMB, CKMBINDEX, TROPONINI in the last 168 hours.  HbA1C: No results found for: HGBA1C  CBG: No results for input(s): GLUCAP in the last 168 hours.  Review of Systems:     Past Medical History  She,  has a past medical history of Abdominal pain, Atrophic vaginitis, Cellulitis (06/2019), CHF (congestive heart failure) (HCC), Colon polyps, Diverticulosis, Essential hypertension, benign, Gastritis, Hiatal hernia, Insomnia, idiopathic, Malaise and fatigue, Osteoarthrosis and allied disorders, Other and unspecified hyperlipidemia, and Thoracic scoliosis.   Surgical History    Past Surgical History:  Procedure Laterality Date  . JOINT REPLACEMENT    . left shoulder surgery     . rt knee   10 1 08   Dr Thomasena Edis  . Rt rotator  cuff repair  4 1 00  . TOTAL HIP ARTHROPLASTY Right 2021  ?     Social History   reports that she has quit smoking. Her smoking use included cigarettes. She has a 0.50 pack-year smoking history. She  has never used smokeless tobacco. She reports that she does not drink alcohol or use drugs.   Family History   Her family history includes Cancer in her sister; Heart disease (age of onset: 56) in her father.   Allergies Allergies  Allergen Reactions  . Simvastatin Other (See Comments)    Leg pain- "Allergic," per MAR  . Ace Inhibitors Cough    "Allergic," per [facility]     Home Medications  Prior to Admission medications   Medication Sig Start Date End Date Taking? Authorizing Provider  acetaminophen (TYLENOL) 325 MG tablet Take 2 tablets (650 mg total) by mouth every 6 (six) hours as needed for mild pain (or Fever >/= 101). 07/24/19   Danford, Earl Lites, MD  albuterol (PROAIR HFA) 108 (90 Base) MCG/ACT inhaler Inhale 2 puffs into the lungs every 6 (six) hours as needed. Patient taking differently: Inhale 2 puffs into the lungs every 4 (four) hours as needed for wheezing or shortness of breath.  06/19/18   Ernestina Penna, MD  amiodarone (PACERONE) 200 MG tablet TAKE (1/2) TABLET DAILY. Patient taking differently: Take 200 mg by mouth in the morning.  06/19/18   Ernestina Penna, MD  ARIPiprazole (ABILIFY) 5 MG tablet Take 5 mg by mouth daily.    [provider]  budesonide-formoterol (SYMBICORT) 160-4.5 MCG/ACT inhaler Inhale 2 puffs into the lungs 2 (two) times daily.    [provider]  busPIRone (BUSPAR) 5 MG tablet Take 1 tablet (5 mg total) by mouth at bedtime. 06/19/18   Ernestina Penna, MD  donepezil (ARICEPT) 10 MG tablet Take 1 tablet (10 mg total) by mouth daily. Patient taking differently: Take 10 mg by mouth at bedtime.  06/19/18   Ernestina Penna, MD  furosemide (LASIX) 20 MG tablet TAKE 2 TABLET A DAY AS DIRECTED. Patient taking differently: Take 20 mg by mouth in the morning.  06/19/18   Ernestina Penna, MD  levothyroxine (SYNTHROID) 50 MCG tablet Take 50 mcg by mouth daily before breakfast.    [provider]  losartan (COZAAR) 100 MG tablet Take 100  mg by mouth in the morning.    [provider]  memantine (NAMENDA) 10 MG tablet TAKE (1) TABLET TWICE A DAY. Patient taking differently: Take 10 mg by mouth 2 (two) times daily.  06/19/18   Chipper Herb, MD  metoprolol succinate (TOPROL-XL) 25 MG 24 hr tablet Take 1 tablet (25 mg total) by mouth daily. Patient taking differently: Take 25 mg by mouth in the morning.  06/19/18   Chipper Herb, MD  omeprazole (PRILOSEC OTC) 20 MG tablet Take 20 mg by mouth in the morning.    [provider]  oxyCODONE (OXY IR/ROXICODONE) 5 MG immediate release tablet Take 1 tablet (5 mg total) by mouth every 6 (six) hours as needed (for pain management). 07/24/19   Danford, Suann Larry, MD  potassium chloride SA (K-DUR,KLOR-CON) 20 MEQ tablet Take 1 tablet (20 mEq total) by mouth daily. 06/19/18   Chipper Herb, MD  senna (SENOKOT) 8.6 MG TABS tablet Take 2 tablets by mouth 2 (two) times daily.    [provider]  Spacer/Aero-Holding Dorise Bullion Use with symbicort inhaler BID 06/19/18   Chipper Herb, MD

## 2019-08-21 ENCOUNTER — Inpatient Hospital Stay (HOSPITAL_COMMUNITY): Payer: Medicare Other | Admitting: Anesthesiology

## 2019-08-21 ENCOUNTER — Encounter (HOSPITAL_COMMUNITY): Payer: Self-pay | Admitting: Critical Care Medicine

## 2019-08-21 ENCOUNTER — Encounter (HOSPITAL_COMMUNITY)
Admission: EM | Disposition: A | Discharge status: Skilled Nursing Facility | Payer: Self-pay | Source: Home / Self Care | Attending: Internal Medicine

## 2019-08-21 ENCOUNTER — Other Ambulatory Visit: Payer: Self-pay | Admitting: Physician Assistant

## 2019-08-21 ENCOUNTER — Inpatient Hospital Stay: Payer: Self-pay

## 2019-08-21 DIAGNOSIS — D62 Acute posthemorrhagic anemia: Secondary | ICD-10-CM

## 2019-08-21 DIAGNOSIS — K922 Gastrointestinal hemorrhage, unspecified: Secondary | ICD-10-CM | POA: Diagnosis not present

## 2019-08-21 DIAGNOSIS — F0391 Unspecified dementia with behavioral disturbance: Secondary | ICD-10-CM

## 2019-08-21 DIAGNOSIS — I480 Paroxysmal atrial fibrillation: Secondary | ICD-10-CM | POA: Diagnosis not present

## 2019-08-21 DIAGNOSIS — K2980 Duodenitis without bleeding: Secondary | ICD-10-CM | POA: Diagnosis not present

## 2019-08-21 DIAGNOSIS — T8189XA Other complications of procedures, not elsewhere classified, initial encounter: Secondary | ICD-10-CM

## 2019-08-21 DIAGNOSIS — N179 Acute kidney failure, unspecified: Secondary | ICD-10-CM | POA: Diagnosis not present

## 2019-08-21 DIAGNOSIS — K259 Gastric ulcer, unspecified as acute or chronic, without hemorrhage or perforation: Secondary | ICD-10-CM | POA: Diagnosis not present

## 2019-08-21 DIAGNOSIS — K269 Duodenal ulcer, unspecified as acute or chronic, without hemorrhage or perforation: Secondary | ICD-10-CM | POA: Diagnosis not present

## 2019-08-21 DIAGNOSIS — M009 Pyogenic arthritis, unspecified: Secondary | ICD-10-CM | POA: Diagnosis not present

## 2019-08-21 HISTORY — PX: I & D EXTREMITY: SHX5045

## 2019-08-21 LAB — CBC
HCT: 34.5 % — ABNORMAL LOW (ref 36.0–46.0)
Hemoglobin: 11.2 g/dL — ABNORMAL LOW (ref 12.0–15.0)
MCH: 28.6 pg (ref 26.0–34.0)
MCHC: 32.5 g/dL (ref 30.0–36.0)
MCV: 88 fL (ref 80.0–100.0)
Platelets: 150 10*3/uL (ref 150–400)
RBC: 3.92 MIL/uL (ref 3.87–5.11)
RDW: 16.4 % — ABNORMAL HIGH (ref 11.5–15.5)
WBC: 8.1 10*3/uL (ref 4.0–10.5)
nRBC: 0 % (ref 0.0–0.2)

## 2019-08-21 LAB — TYPE AND SCREEN
ABO/RH(D): O POS
Antibody Screen: NEGATIVE
Unit division: 0

## 2019-08-21 LAB — BASIC METABOLIC PANEL
Anion gap: 8 (ref 5–15)
BUN: 23 mg/dL (ref 8–23)
CO2: 22 mmol/L (ref 22–32)
Calcium: 7.9 mg/dL — ABNORMAL LOW (ref 8.9–10.3)
Chloride: 107 mmol/L (ref 98–111)
Creatinine, Ser: 1.36 mg/dL — ABNORMAL HIGH (ref 0.44–1.00)
GFR calc Af Amer: 39 mL/min — ABNORMAL LOW (ref >60)
GFR calc non Af Amer: 34 mL/min — ABNORMAL LOW (ref >60)
Glucose, Bld: 119 mg/dL — ABNORMAL HIGH (ref 70–99)
Potassium: 3 mmol/L — ABNORMAL LOW (ref 3.5–5.1)
Sodium: 137 mmol/L (ref 135–145)

## 2019-08-21 LAB — SURGICAL PATHOLOGY

## 2019-08-21 LAB — BPAM RBC
Blood Product Expiration Date: 202105202359
ISSUE DATE / TIME: 202104261023
Unit Type and Rh: 5100

## 2019-08-21 LAB — GLUCOSE, CAPILLARY: Glucose-Capillary: 107 mg/dL — ABNORMAL HIGH (ref 70–99)

## 2019-08-21 SURGERY — IRRIGATION AND DEBRIDEMENT EXTREMITY
Anesthesia: General | Site: Thigh | Laterality: Right

## 2019-08-21 MED ORDER — PROPOFOL 10 MG/ML IV BOLUS
INTRAVENOUS | Status: DC | PRN
Start: 2019-08-21 — End: 2019-08-21
  Administered 2019-08-21: 90 mg via INTRAVENOUS
  Administered 2019-08-21: 20 mg via INTRAVENOUS

## 2019-08-21 MED ORDER — CEFAZOLIN SODIUM-DEXTROSE 2-3 GM-%(50ML) IV SOLR
INTRAVENOUS | Status: DC | PRN
Start: 2019-08-21 — End: 2019-08-21
  Administered 2019-08-21: 2 g via INTRAVENOUS

## 2019-08-21 MED ORDER — GERHARDT'S BUTT CREAM
TOPICAL_CREAM | CUTANEOUS | Status: DC | PRN
  Filled 2019-08-21 (×3): qty 1

## 2019-08-21 MED ORDER — DOCUSATE SODIUM 100 MG PO CAPS
100.0000 mg | ORAL_CAPSULE | Freq: Two times a day (BID) | ORAL | Status: DC
  Administered 2019-08-22 – 2019-08-24 (×5): 100 mg via ORAL
  Filled 2019-08-21 (×6): qty 1

## 2019-08-21 MED ORDER — CHLORHEXIDINE GLUCONATE 4 % EX LIQD
60.0000 mL | Freq: Once | CUTANEOUS | Status: AC
  Administered 2019-08-21: 4 via TOPICAL
  Filled 2019-08-21: qty 60

## 2019-08-21 MED ORDER — PROPOFOL 10 MG/ML IV BOLUS
INTRAVENOUS | Status: AC
  Filled 2019-08-21: qty 20

## 2019-08-21 MED ORDER — SODIUM CHLORIDE 0.9 % IV BOLUS
250.0000 mL | Freq: Once | INTRAVENOUS | Status: AC
  Administered 2019-08-21: 250 mL via INTRAVENOUS

## 2019-08-21 MED ORDER — CEFAZOLIN SODIUM-DEXTROSE 2-4 GM/100ML-% IV SOLN
2.0000 g | INTRAVENOUS | Status: DC
  Filled 2019-08-21: qty 100

## 2019-08-21 MED ORDER — MENTHOL 3 MG MT LOZG
1.0000 | LOZENGE | OROMUCOSAL | Status: DC | PRN
  Filled 2019-08-21: qty 9

## 2019-08-21 MED ORDER — METHOCARBAMOL 500 MG PO TABS
500.0000 mg | ORAL_TABLET | Freq: Four times a day (QID) | ORAL | Status: DC | PRN
  Administered 2019-08-21: 21:00:00 500 mg via ORAL
  Filled 2019-08-21: qty 1

## 2019-08-21 MED ORDER — SODIUM CHLORIDE 0.9 % IV SOLN: INTRAVENOUS | Status: DC

## 2019-08-21 MED ORDER — METHOCARBAMOL 1000 MG/10ML IJ SOLN
500.0000 mg | Freq: Four times a day (QID) | INTRAVENOUS | Status: DC | PRN
  Filled 2019-08-21: qty 5

## 2019-08-21 MED ORDER — EPHEDRINE SULFATE 50 MG/ML IJ SOLN
INTRAMUSCULAR | Status: DC | PRN
Start: 2019-08-21 — End: 2019-08-21
  Administered 2019-08-21: 5 mg via INTRAVENOUS

## 2019-08-21 MED ORDER — VASOPRESSIN 20 UNIT/ML IV SOLN
INTRAVENOUS | Status: DC | PRN
  Administered 2019-08-21: 1 [IU] via INTRAVENOUS

## 2019-08-21 MED ORDER — ONDANSETRON HCL 4 MG/2ML IJ SOLN
4.0000 mg | Freq: Four times a day (QID) | INTRAMUSCULAR | Status: DC | PRN
  Administered 2019-08-21: 4 mg via INTRAVENOUS
  Filled 2019-08-21: qty 2

## 2019-08-21 MED ORDER — VANCOMYCIN HCL IN DEXTROSE 1-5 GM/200ML-% IV SOLN
1000.0000 mg | Freq: Once | INTRAVENOUS | Status: AC
  Administered 2019-08-21: 21:00:00 1000 mg via INTRAVENOUS
  Filled 2019-08-21 (×2): qty 200

## 2019-08-21 MED ORDER — SODIUM CHLORIDE 0.9 % IV SOLN: INTRAVENOUS | Status: DC | PRN

## 2019-08-21 MED ORDER — SODIUM CHLORIDE 0.9 % IR SOLN
Status: DC | PRN
  Administered 2019-08-21: 3000 mL

## 2019-08-21 MED ORDER — POVIDONE-IODINE 10 % EX SWAB: 2.0000 "application " | Freq: Once | CUTANEOUS | Status: DC

## 2019-08-21 MED ORDER — TRANEXAMIC ACID-NACL 1000-0.7 MG/100ML-% IV SOLN
1000.0000 mg | Freq: Once | INTRAVENOUS | Status: AC
  Administered 2019-08-21: 20:00:00 1000 mg via INTRAVENOUS
  Filled 2019-08-21 (×2): qty 100

## 2019-08-21 MED ORDER — FENTANYL CITRATE (PF) 250 MCG/5ML IJ SOLN
INTRAMUSCULAR | Status: DC | PRN
  Administered 2019-08-21: 50 ug via INTRAVENOUS

## 2019-08-21 MED ORDER — DIPHENHYDRAMINE HCL 12.5 MG/5ML PO ELIX
12.5000 mg | ORAL_SOLUTION | ORAL | Status: DC | PRN
  Filled 2019-08-21: qty 10

## 2019-08-21 MED ORDER — POTASSIUM CHLORIDE CRYS ER 20 MEQ PO TBCR
20.0000 meq | EXTENDED_RELEASE_TABLET | ORAL | Status: AC
  Administered 2019-08-21 – 2019-08-22 (×2): 20 meq via ORAL
  Filled 2019-08-21 (×2): qty 1

## 2019-08-21 MED ORDER — TRAMADOL HCL 50 MG PO TABS
50.0000 mg | ORAL_TABLET | Freq: Four times a day (QID) | ORAL | Status: DC | PRN
  Administered 2019-08-21 – 2019-08-22 (×2): 50 mg via ORAL
  Filled 2019-08-21: qty 1

## 2019-08-21 MED ORDER — ONDANSETRON HCL 4 MG PO TABS: 4.0000 mg | ORAL_TABLET | Freq: Four times a day (QID) | ORAL | Status: DC | PRN

## 2019-08-21 MED ORDER — 0.9 % SODIUM CHLORIDE (POUR BTL) OPTIME
TOPICAL | Status: DC | PRN
  Administered 2019-08-21: 1000 mL

## 2019-08-21 MED ORDER — VANCOMYCIN HCL 750 MG/150ML IV SOLN
750.0000 mg | INTRAVENOUS | Status: DC
  Filled 2019-08-21: qty 150

## 2019-08-21 MED ORDER — FENTANYL CITRATE (PF) 250 MCG/5ML IJ SOLN
INTRAMUSCULAR | Status: AC
  Filled 2019-08-21: qty 5

## 2019-08-21 MED ORDER — PHENOL 1.4 % MT LIQD: 1.0000 | OROMUCOSAL | Status: DC | PRN

## 2019-08-21 MED ORDER — ACETAMINOPHEN 325 MG PO TABS
325.0000 mg | ORAL_TABLET | Freq: Four times a day (QID) | ORAL | Status: DC | PRN
  Administered 2019-08-24: 650 mg via ORAL
  Filled 2019-08-21: qty 2

## 2019-08-21 MED ORDER — TRAMADOL HCL 50 MG PO TABS
ORAL_TABLET | ORAL | Status: AC
  Filled 2019-08-21: qty 1

## 2019-08-21 MED ORDER — IRRISEPT - 450ML BOTTLE WITH 0.05% CHG IN STERILE WATER, USP 99.95% OPTIME
TOPICAL | Status: DC | PRN
  Administered 2019-08-21: 450 mL

## 2019-08-21 MED ORDER — MAGNESIUM CITRATE PO SOLN
1.0000 | Freq: Once | ORAL | Status: DC | PRN
  Filled 2019-08-21: qty 296

## 2019-08-21 MED ORDER — SUCCINYLCHOLINE CHLORIDE 20 MG/ML IJ SOLN
INTRAMUSCULAR | Status: DC | PRN
  Administered 2019-08-21: 100 mg via INTRAVENOUS

## 2019-08-21 SURGICAL SUPPLY — 66 items
BNDG CMPR MED 10X6 ELC LF (GAUZE/BANDAGES/DRESSINGS) ×1
BNDG ELASTIC 4X5.8 VLCR STR LF (GAUZE/BANDAGES/DRESSINGS) ×3 IMPLANT
BNDG ELASTIC 6X10 VLCR STRL LF (GAUZE/BANDAGES/DRESSINGS) ×2 IMPLANT
BNDG ELASTIC 6X5.8 VLCR STR LF (GAUZE/BANDAGES/DRESSINGS) ×2 IMPLANT
BNDG GAUZE ELAST 4 BULKY (GAUZE/BANDAGES/DRESSINGS) ×3 IMPLANT
COVER WAND RF STERILE (DRAPES) ×1 IMPLANT
CUFF TOURN SGL QUICK 18X4 (TOURNIQUET CUFF) ×1 IMPLANT
CUFF TOURN SGL QUICK 24 (TOURNIQUET CUFF)
CUFF TOURN SGL QUICK 34 (TOURNIQUET CUFF)
CUFF TOURN SGL QUICK 42 (TOURNIQUET CUFF) IMPLANT
CUFF TRNQT CYL 24X4X16.5-23 (TOURNIQUET CUFF) IMPLANT
CUFF TRNQT CYL 34X4.125X (TOURNIQUET CUFF) IMPLANT
DRAPE SURG 17X23 STRL (DRAPES) ×3 IMPLANT
DRAPE U-SHAPE 47X51 STRL (DRAPES) ×3 IMPLANT
DRSG EMULSION OIL 3X3 NADH (GAUZE/BANDAGES/DRESSINGS) ×1 IMPLANT
DRSG PAD ABDOMINAL 8X10 ST (GAUZE/BANDAGES/DRESSINGS) ×3 IMPLANT
ELECT REM PT RETURN 9FT ADLT (ELECTROSURGICAL)
ELECTRODE REM PT RTRN 9FT ADLT (ELECTROSURGICAL) IMPLANT
FACESHIELD WRAPAROUND (MASK) ×3 IMPLANT
FACESHIELD WRAPAROUND OR TEAM (MASK) ×1 IMPLANT
GAUZE SPONGE 4X4 12PLY STRL (GAUZE/BANDAGES/DRESSINGS) ×3 IMPLANT
GAUZE SPONGE 4X4 12PLY STRL LF (GAUZE/BANDAGES/DRESSINGS) ×2 IMPLANT
GAUZE XEROFORM 5X9 LF (GAUZE/BANDAGES/DRESSINGS) ×2 IMPLANT
GLOVE BIOGEL PI IND STRL 7.5 (GLOVE) ×1 IMPLANT
GLOVE BIOGEL PI IND STRL 8 (GLOVE) ×1 IMPLANT
GLOVE BIOGEL PI INDICATOR 7.5 (GLOVE) ×2
GLOVE BIOGEL PI INDICATOR 8 (GLOVE) ×2
GLOVE ECLIPSE 8.0 STRL XLNG CF (GLOVE) ×3 IMPLANT
GLOVE ORTHO TXT STRL SZ7.5 (GLOVE) ×3 IMPLANT
GLOVE SURG ORTHO 8.0 STRL STRW (GLOVE) ×3 IMPLANT
GOWN STRL REIN 3XL XLG LVL4 (GOWN DISPOSABLE) ×1 IMPLANT
GOWN STRL REUS W/ TWL LRG LVL3 (GOWN DISPOSABLE) ×3 IMPLANT
GOWN STRL REUS W/TWL LRG LVL3 (GOWN DISPOSABLE) ×9
HANDPIECE INTERPULSE COAX TIP (DISPOSABLE) ×3
KIT BASIN OR (CUSTOM PROCEDURE TRAY) ×3 IMPLANT
KIT TURNOVER KIT B (KITS) ×3 IMPLANT
MANIFOLD NEPTUNE II (INSTRUMENTS) ×3 IMPLANT
NS IRRIG 1000ML POUR BTL (IV SOLUTION) ×3 IMPLANT
PACK ORTHO EXTREMITY (CUSTOM PROCEDURE TRAY) ×3 IMPLANT
PAD ABD 8X10 STRL (GAUZE/BANDAGES/DRESSINGS) ×4 IMPLANT
PAD ARMBOARD 7.5X6 YLW CONV (MISCELLANEOUS) ×6 IMPLANT
PADDING CAST COTTON 6X4 STRL (CAST SUPPLIES) ×4 IMPLANT
SET HNDPC FAN SPRY TIP SCT (DISPOSABLE) IMPLANT
SPONGE LAP 18X18 RF (DISPOSABLE) ×3 IMPLANT
SPONGE LAP 4X18 RFD (DISPOSABLE) ×3 IMPLANT
STAPLER VISISTAT 35W (STAPLE) ×2 IMPLANT
STOCKINETTE IMPERVIOUS 9X36 MD (GAUZE/BANDAGES/DRESSINGS) ×3 IMPLANT
SUCTION FRAZIER HANDLE 10FR (MISCELLANEOUS)
SUCTION TUBE FRAZIER 10FR DISP (MISCELLANEOUS) IMPLANT
SUT DVC VLOC 180 0 12IN GS21 (SUTURE) ×3
SUT ETHILON 3 0 PS 1 (SUTURE) ×3 IMPLANT
SUT SILK 2 0 PERMA HAND 18 BK (SUTURE) ×3 IMPLANT
SUT VIC AB 1 CT1 27 (SUTURE) ×6
SUT VIC AB 1 CT1 27XBRD ANBCTR (SUTURE) IMPLANT
SUT VIC AB 2-0 CT1 27 (SUTURE) ×6
SUT VIC AB 2-0 CT1 TAPERPNT 27 (SUTURE) IMPLANT
SUT VLOC 180 0 24IN GS25 (SUTURE) ×2 IMPLANT
SUTURE DVC VLC 180 0 12IN GS21 (SUTURE) IMPLANT
SWAB CULTURE ESWAB REG 1ML (MISCELLANEOUS) IMPLANT
TOWEL GREEN STERILE (TOWEL DISPOSABLE) ×3 IMPLANT
TOWEL GREEN STERILE FF (TOWEL DISPOSABLE) ×3 IMPLANT
TUBE CONNECTING 12'X1/4 (SUCTIONS) ×1
TUBE CONNECTING 12X1/4 (SUCTIONS) ×2 IMPLANT
UNDERPAD 30X30 (UNDERPADS AND DIAPERS) ×3 IMPLANT
WATER STERILE IRR 1000ML POUR (IV SOLUTION) ×3 IMPLANT
YANKAUER SUCT BULB TIP NO VENT (SUCTIONS) ×3 IMPLANT

## 2019-08-21 NOTE — Progress Notes (Signed)
eLink Physician-Brief Progress Note Patient Name: [name] DOB: 16-Oct-1924 MRN: [id number]   Date of Service  08/21/2019  HPI/Events of Note  Lactic acid increased from 1.6 to 2.5  eICU Interventions  NS 250 ml iv fluid bolus @ 125 ml/ hour x 1.        Thomasene Lot Christina Gintz 08/21/2019, 12:10 AM

## 2019-08-21 NOTE — Progress Notes (Signed)
Pharmacy Antibiotic Note  [name] is a 86 y.o. female admitted on 08/18/2019 with wound infection.  Pharmacy has been consulted for vancomycin dosing. Patient also continuing on ceftriaxone, flagyl from pre-op (doxy d/c'd per discussion with PA). SCr trended down from 2.97 to 1.53 (last 4/26).  Plan: Vancomycin 1g IV x 1; then Vancomycin 750 mg IV Q 48 hrs. Goal AUC 400-550. Expected AUC: 407 SCr used: 1.53 Ceftriaxone + Flagyl per MD Monitor clinical progress, c/s, renal function F/u de-escalation plan/LOT, vancomycin levels as indicated   Height: 5\' 6"  (167.6 cm) Weight: 57.8 kg (127 lb 6.8 oz) IBW/kg (Calculated) : 59.3  Temp (24hrs), Avg:97.5 F (36.4 C), Min:97 F (36.1 C), Max:97.8 F (36.6 C)  Recent Labs  Lab 08/18/19 1645 08/18/19 1904 08/19/19 0154 08/19/19 0745 08/19/19 0745 08/19/19 1819 08/19/19 2107 08/20/19 0332 08/20/19 0823 08/20/19 0935 08/20/19 1854 08/21/19 0811  WBC 13.0*  --    < > 13.0*   < > 12.7* 12.7* 13.0* 10.2  --   --  8.1  CREATININE 2.97*  --   --  1.94*  --   --   --  1.65*  --   --  1.53*  --   LATICACIDVEN  --  1.3  --   --   --   --  2.2*  --   --  1.6 2.5*  --    < > = values in this interval not displayed.    Estimated Creatinine Clearance: 21.4 mL/min (A) (by C-G formula based on SCr of 1.53 mg/dL (H)).    Allergies  Allergen Reactions  . Simvastatin Other (See Comments)    Leg pain- "Allergic," per MAR  . Ace Inhibitors Cough    "Allergic," per [facility]     [facility], PharmD, BCPS Please check AMION for all [facility] contact numbers Clinical Pharmacist 08/21/2019 4:42 PM

## 2019-08-21 NOTE — Progress Notes (Signed)
NAME:  [name], MRN:  [id number], DOB:  May 17, 1924, LOS: 3 ADMISSION DATE:  08/18/2019, CONSULTATION DATE:  08/18/19  REFERRING MD:  Dr. Chaney Malling, CHIEF COMPLAINT:  sepsis   Brief History   86 year old presented to ED with severe hypovolemic due to an upper GI bleed. Plan for EGD today.   History of present illness   86 year old female hx CHF (no echo in chart), Afib not on AC, asthma, bronchiectasis, CKD 3, hypothyroidism, HTN, HLD , dementia and recent ORIF 06/26/2019 for periprosthetic femur fracture who presents with abdominal pain for a few days associated with melena for the last day. She resides at [facility].  She was noted to have several episodes of brbpr today.  Arrived to ED and was found to be hypotensive with BP74/55. Ct renal consistent with small bowel enteritis (transverse and ascending part of duodenum).  Ct head no intracranial abnormalities.  Hb 11, lactate 1.3.  She received 2L NS and was started on cipro and flagyl.   Recent hospital admission 07/22/19 for UTI.  D/c home on cephalexin.   Right peri-prosthetic femur fracture s/p ORIF 3/2/2. Follows at [facility].   Admitted to medicine service initially on 4/24.  Received total 3.5 L.  Continued with episode hypotension with sbp 80-90's and was transferred to the ICU for initiation of pressors.    Past Medical History  CHF, Afib  Asthma, Bronchiectasis HTN, HLD Dementia Hypothyroidism  Diverticulosis    Significant Hospital Events   4/24 Admission to hospitalist service  4/25 Developed hematochezia and hypotension not responsive to IVF and transferred to ICU for pressor support   Consults:  GI, critical care  Procedures:  None   Significant Diagnostic Tests:  4/24 Ct renal- IMPRESSION: 1. Inflammatory stranding about the transverse and ascending portions of the duodenum, consistent with infectious or inflammatory enteritis. 2.  No evidence of urinary tract calculus or  hydronephrosis. 3.  Coronary artery disease.  Aortic Atherosclerosis  4/24 CXR: FINDINGS: Stable cardiac and mediastinal contours. Atherosclerotic calcifications present throughout the aorta. No focal airspace opacity to suggest pneumonia. Chronic bronchitic changes remain stable. Minimal right apical pleuroparenchymal scarring. Evidence of prior bilateral rotator cuff repair. No acute osseous abnormality.  Micro Data:  4/24 ucx- DIPHTHEROIDS(CORYNEBACTERIUM SPECIES)   4/24 Bcx- NG @ 24hrs   Antimicrobials:  Cipro 4/24 Rocephin 4/24>  Flagyl 4/24>     Interim history/subjective:  No acute events overnight. On levo @ 2. She is complaining of thirst but otherwise no other issues. 2 BMs overnight, no hematochezia or melena per RN.   Objective   Blood pressure (!) 138/57, pulse (!) 59, temperature (!) 97 F (36.1 C), resp. rate 16, height 5\' 6"  (1.676 m), weight 57.8 kg, SpO2 100 %.        Intake/Output Summary (Last 24 hours) at 08/21/2019 1648 Last data filed at 08/21/2019 1620 Gross per 24 hour  Intake 1815.14 ml  Output 1275 ml  Net 540.14 ml   Filed Weights   08/20/19 0500 08/20/19 1345 08/21/19 0500  Weight: 57.7 kg 57.7 kg 57.8 kg    Examination: General: Chronically ill-appearing elderly woman lying in bed no acute distress HENT:  [place]/AT, eyes anicteric.  Mucous membranes moist Lungs: Breathing comfortably on room air, clear to auscultation bilaterally Cardiovascular: Regular rate, irregular rhythm Abdomen: soft, NT, nondistended Extremities: Warm, no edema.  Exam of dehisced wound and knee deferred.  Knee in immobilizer. Neuro: Awake and alert, normal speech.  Globally weak. Skin: Warm, dry.  No rashes  Resolved Hospital Problem list     Assessment & Plan:   Hypovolemic shock from GIB.  Shock resolved.  EGD demonstrated duodenal ulcers. -Continue high-dose PPI twice daily -Appreciate GIs assistance -Continue IV fluids while n.p.o. for the OR -GI path  pending from ulcer biopsies -Continue to monitor CBC  Septic right prosthetic knee Wound dehiscence- Recent ORIF for periprosthetic femur fracture repair by Dr. Case at [facility] -Appreciate orthopedics input.  OR for washout today, which confirmed septic arthritis.  Operative culture sent. -Pain control per orthopedic's recommendations -Recommend ID consult tomorrow   AKI on CKD 3A- likely secondary to ATN from hypotension.  Improving -Continue to monitor -Renally dose meds and avoid nephrotoxic meds -Strict I/O  HFpEF PAF, no chronic AC HTN HLD  -Continue PTA amiodarone -Maintain euvolemia -Holding antihypertensives.  Asthma Continue Dulera and albuterol as needed  Hypothyroidism  -Continue PTA Synthroid  Lactic acidosis, hyperchloremic nonanion gap acidosis -Repeat BMP this afternoon -Prefer balanced crystalloids to saline  Best practice:  Diet: NPO Pain/Anxiety/Delirium protocol (if indicated): N/A VAP protocol (if indicated): N/A DVT prophylaxis:  scd GI prophylaxis:  PPI bid Glucose control: SSI Mobility: per ortho Code Status: full code Family Communication:  Niece main decision maker Disposition: ICU   Labs   CBC: Recent Labs  Lab 08/18/19 1645 08/19/19 0154 08/19/19 1819 08/19/19 2107 08/20/19 0332 08/20/19 0823 08/21/19 0811  WBC 13.0*   < > 12.7* 12.7* 13.0* 10.2 8.1  NEUTROABS 10.8*  --   --   --   --   --   --   HGB 11.1*   < > 10.3* 10.4* 10.6* 10.2* 11.2*  HCT 34.2*   < > 32.0* 32.7* 32.5* 31.8* 34.5*  MCV 88.4   < > 88.2 88.1 86.4 88.1 88.0  PLT 227   < > 229 242 252 193 150   < > = values in this interval not displayed.    Basic Metabolic Panel: Recent Labs  Lab 08/18/19 1645 08/19/19 0745 08/20/19 0332 08/20/19 1854  NA 137 142 143 141  K 4.4 3.8 3.7 3.1*  CL 108 119* 118* 114*  CO2 21* 15* 17* 17*  GLUCOSE 105* 79 90 103*  BUN 119* 76* 51* 36*  CREATININE 2.97* 1.94* 1.65* 1.53*  CALCIUM 8.3* 7.8* 8.3* 8.0*  MG  --   --  1.4*   --   PHOS  --   --  3.1  --    GFR: Estimated Creatinine Clearance: 21.4 mL/min (A) (by C-G formula based on SCr of 1.53 mg/dL (H)). Recent Labs  Lab 08/18/19 1904 08/19/19 0154 08/19/19 2107 08/20/19 0332 08/20/19 0823 08/20/19 0935 08/20/19 1854 08/21/19 0811  WBC  --    < > 12.7* 13.0* 10.2  --   --  8.1  LATICACIDVEN 1.3  --  2.2*  --   --  1.6 2.5*  --    < > = values in this interval not displayed.    Liver Function Tests: Recent Labs  Lab 08/18/19 1645  AST 31  ALT 25  ALKPHOS 88  BILITOT 0.7  PROT 4.5*  ALBUMIN 2.3*   No results for input(s): LIPASE, AMYLASE in the last 168 hours. No results for input(s): AMMONIA in the last 168 hours.  ABG No results found for: PHART, PCO2ART, PO2ART, HCO3, TCO2, ACIDBASEDEF, O2SAT   Coagulation Profile: Recent Labs  Lab 08/20/19 0935  INR 1.3*    Cardiac Enzymes: No  results for input(s): CKTOTAL, CKMB, CKMBINDEX, TROPONINI in the last 168 hours.  HbA1C: No results found for: HGBA1C  CBG: Recent Labs  Lab 08/21/19 [place] Jacquelyn Antony, DO 08/21/19 5:02 PM [facility]

## 2019-08-21 NOTE — Discharge Instructions (Signed)
INSTRUCTIONS AFTER SURGERY  o Remove items at home which could result in a fall. This includes throw rugs or furniture in walking pathways o ICE to the affected joint every three hours while awake for 30 minutes at a time, for at least the first 3-5 days, and then as needed for pain and swelling.  Continue to use ice for pain and swelling. You may notice swelling that will progress down to the foot and ankle.  This is normal after surgery.  Elevate your leg when you are not up walking on it.   o Continue to use the breathing machine you got in the hospital (incentive spirometer) which will help keep your temperature down.  It is common for your temperature to cycle up and down following surgery, especially at night when you are not up moving around and exerting yourself.  The breathing machine keeps your lungs expanded and your temperature down.   DIET:  As you were doing prior to hospitalization, we recommend a well-balanced diet.  DRESSING / WOUND CARE / SHOWERING  Keep the surgical dressing until follow up.  The dressing is water proof, so you can shower without any extra covering.  IF THE DRESSING FALLS OFF or the wound gets wet inside, change the dressing with sterile gauze.  Please use good hand washing techniques before changing the dressing.  Do not use any lotions or creams on the incision until instructed by your surgeon.    ACTIVITY  o Increase activity slowly as tolerated, but follow the weight bearing instructions below.   o No driving for 6 weeks or until further direction given by your physician.  You cannot drive while taking narcotics.  o No lifting or carrying greater than 10 lbs. until further directed by your surgeon. o Avoid periods of inactivity such as sitting longer than an hour when not asleep. This helps prevent blood clots.  o You may return to work once you are authorized by your doctor.     WEIGHT BEARING   Weight bearing as tolerated with assist device (walker,  cane, etc) as directed, use it as long as suggested by your surgeon or therapist, typically at least 4-6 weeks.    CONSTIPATION  Constipation is defined medically as fewer than three stools per week and severe constipation as less than one stool per week.  Even if you have a regular bowel pattern at home, your normal regimen is likely to be disrupted due to multiple reasons following surgery.  Combination of anesthesia, postoperative narcotics, change in appetite and fluid intake all can affect your bowels.   YOU MUST use at least one of the following options; they are listed in order of increasing strength to get the job done.  They are all available over the counter, and you may need to use some, POSSIBLY even all of these options:    Drink plenty of fluids (prune juice may be helpful) and high fiber foods Colace 100 mg by mouth twice a day  Senokot for constipation as directed and as needed Dulcolax (bisacodyl), take with full glass of water  Miralax (polyethylene glycol) once or twice a day as needed.  If you have tried all these things and are unable to have a bowel movement in the first 3-4 days after surgery call either your surgeon or your primary doctor.    If you experience loose stools or diarrhea, hold the medications until you stool forms back up.  If your symptoms do not get better   within 1 week or if they get worse, check with your doctor.  If you experience "the worst abdominal pain ever" or develop nausea or vomiting, please contact the office immediately for further recommendations for treatment.   ITCHING:  If you experience itching with your medications, try taking only a single pain pill, or even half a pain pill at a time.  You can also use Benadryl over the counter for itching or also to help with sleep.   TED HOSE STOCKINGS:  Use stockings on both legs until for at least 2 weeks or as directed by physician office. They may be removed at night for  sleeping.  MEDICATIONS:  See your medication summary on the "After Visit Summary" that nursing will review with you.  You may have some home medications which will be placed on hold until you complete the course of blood thinner medication.  It is important for you to complete the blood thinner medication as prescribed.  PRECAUTIONS:  If you experience chest pain or shortness of breath - call 911 immediately for transfer to the hospital emergency department.   If you develop a fever greater that 101 F, purulent drainage from wound, increased redness or drainage from wound, foul odor from the wound/dressing, or calf pain - CONTACT YOUR SURGEON.                                                   FOLLOW-UP APPOINTMENTS:  If you do not already have a post-op appointment, please call the office for an appointment to be seen by your surgeon.  Guidelines for how soon to be seen are listed in your "After Visit Summary", but are typically between 1-4 weeks after surgery.  MAKE SURE YOU:  . Understand these instructions.  . Get help right away if you are not doing well or get worse.    Thank you for letting us be a part of your medical care team.  It is a privilege we respect greatly.  We hope these instructions will help you stay on track for a fast and full recovery!    

## 2019-08-21 NOTE — Progress Notes (Addendum)
Progress Note   Subjective  Chief Complaint: Dark, FOBT positive stools, status post EGD 4/26 with duodenal ulcers  This morning, patient tells me that she is "currently laying in a pile of shift", tells me that her mouth is dry and that her lips and tongue hurt.  Denies any GI pain.   Objective   Vital signs in last 24 hours: Temp:  [97.5 F (36.4 C)-98.1 F (36.7 C)] 97.8 F (36.6 C) (04/27 0813) Pulse Rate:  [34-89] 62 (04/27 1100) Resp:  [9-30] 16 (04/27 1100) BP: (97-150)/(35-98) 135/54 (04/27 1100) SpO2:  [94 %-100 %] 100 % (04/27 1100) Weight:  [57.7 kg-57.8 kg] 57.8 kg (04/27 0500) Last BM Date: 08/21/19 General:   Elderly white female in NAD Heart:  Regular rate and rhythm; no murmurs Lungs: Respirations even and unlabored, lungs CTA bilaterally Abdomen:  Soft, nontender and nondistended. Normal bowel sounds. Extremities:  Without edema. Neurologic:  Alert and oriented,  grossly normal neurologically.   Intake/Output from previous day: 04/26 0701 - 04/27 0700 In: 1327.9 [P.O.:480; I.V.:44.9; Blood:695; IV Piggyback:108] Out: 1750 [Urine:1750]  Lab Results: Recent Labs    08/20/19 0332 08/20/19 0823 08/21/19 0811  WBC 13.0* 10.2 8.1  HGB 10.6* 10.2* 11.2*  HCT 32.5* 31.8* 34.5*  PLT 252 193 150   BMET Recent Labs    08/19/19 0745 08/20/19 0332 08/20/19 1854  NA 142 143 141  K 3.8 3.7 3.1*  CL 119* 118* 114*  CO2 15* 17* 17*  GLUCOSE 79 90 103*  BUN 76* 51* 36*  CREATININE 1.94* 1.65* 1.53*  CALCIUM 7.8* 8.3* 8.0*   LFT Recent Labs    08/18/19 1645  PROT 4.5*  ALBUMIN 2.3*  AST 31  ALT 25  ALKPHOS 88  BILITOT 0.7   PT/INR Recent Labs    08/20/19 0935  LABPROT 16.1*  INR 1.3*    Studies/Results: DG Knee 1-2 Views Right  Result Date: 08/20/2019 CLINICAL DATA:  History of total knee replacement EXAM: RIGHT KNEE - 1-2 VIEW COMPARISON:  06/28/2019 FINDINGS: Changes of right knee replacement and plate and screw fixation in the  distal right femur. Stable appearance since prior study. Distal femoral periprostatic fracture again partially imaged with evidence of some healing and callus. No acute fracture, subluxation or dislocation. IMPRESSION: Stable hardware appearance. No acute bony abnormality. Healing distal femoral fracture. Electronically Signed   By: Charlett Nose M.D.   On: 08/20/2019 12:11   DG FEMUR 1V RIGHT  Result Date: 08/20/2019 CLINICAL DATA:  Right hip replacement. Total right knee replacement. EXAM: RIGHT FEMUR 1 VIEW COMPARISON:  Knee series 06/28/2019, hip series 06/21/2019 FINDINGS: changes of right hip replacement and right knee replacement. Plate and screw fixation device noted in the distal right femur. Hardware is stable when compared to prior studies. No hardware complicating feature. No acute bony abnormality. No acute fracture, subluxation or dislocation. Probable old right inferior pubic ramus fracture, stable since prior study. IMPRESSION: Prior right hip replacement and right knee replacement as well as plate and screw fixation in the distal right femur. No change since prior studies. No acute bony abnormality. Electronically Signed   By: Charlett Nose M.D.   On: 08/20/2019 12:09   EGD 08/20/2019  Impression:    - Non-obstructing and mild Schatzki ring.                           - 3 cm hiatal hernia.                           -  Normal upper third of esophagus and middle third                            of esophagus.                           - Non-bleeding gastric ulcers with no stigmata of                            bleeding. Biopsied.                           - Gastritis. Biopsied.                           - Oozing duodenal ulcers with pigmented material.                            Clips were placed. Biopsied. Recommendation:           - Return patient to hospital ward for ongoing care.                           - Clear liquid diet.                           - Continue present medications.                            - Await pathology results.                           - Use Protonix (pantoprazole) 40 mg PO BID for 8                            weeks, then reduce to 40 mg/day for ongoing                            gastric/duodenal prophylaxis.                           - If the biopsies are negative for H pylori, then                            plan for fasting serum gastrin level.                           - If H pylori positive, will plan on appropriate                            antibiotic course followed by stool Ag testing for                            eradication. Will discuss the role and utility of  repeat EGD as outpatient based on subsequent Hg/Hct                            response, clinical evidence of rebleeding, and                            discussion with patient/family at follow-up.                            Otherwise, given age and co-morbidites, following                            clinically with periodic labs would be an                            appropriate consideration.                           - I discussed these results with the patient and                            her niece, Gean Maidens.   Assessment / Plan:   Assessment: 1.  GI bleed: EGD 08/20/2019 with duodenal ulcers which were oozing as well as nonbleeding gastric ulcers, clips were placed in the duodenum, hgb stable 10.2-->11.2 overnight  Plan: 1.  Continue Pantoprazole 40 p.o. twice daily for 8 weeks, then reduce to 40 mg daily indefinitely 2.  Biopsies pending for H. pylori, if positive then we will treat appropriately, if negative then will plan for fasting serum gastrin level 3.  Please await any further recommendations from Dr. Barron Alvine later today  Thank you for kind consultation.   LOS: 3 days   Unk Lightning  08/21/2019, 12:47 PM

## 2019-08-21 NOTE — Anesthesia Postprocedure Evaluation (Signed)
Anesthesia Post Note  Patient: [name]  Procedure(s) Performed: IRRIGATION AND DEBRIDEMENT EXTREMITY (Right Thigh)     Patient location during evaluation: PACU Anesthesia Type: General Level of consciousness: awake and alert Pain management: pain level controlled Vital Signs Assessment: post-procedure vital signs reviewed and stable Respiratory status: spontaneous breathing, nonlabored ventilation and respiratory function stable Cardiovascular status: blood pressure returned to baseline and stable Postop Assessment: no apparent nausea or vomiting Anesthetic complications: no    Last Vitals:  Vitals:   08/21/19 1300 08/21/19 1630  BP: (!) 143/81 (!) 138/57  Pulse: (!) 57 (!) 59  Resp: 13 16  Temp:  (!) 36.1 C  SpO2: 100% 100%    Last Pain:  Vitals:   08/21/19 1630  TempSrc:   PainSc: Asleep                 Yeira Gulden A.

## 2019-08-21 NOTE — Anesthesia Preprocedure Evaluation (Addendum)
Anesthesia Evaluation  Patient identified by MRN, date of birth, ID band Patient awake    Reviewed: Allergy & Precautions, NPO status , Patient's Chart, lab work & pertinent test results, reviewed documented beta blocker date and time   Airway Mallampati: II  TM Distance: >3 FB Neck ROM: Full    Dental  (+) Caps, Teeth Intact   Pulmonary shortness of breath and with exertion, asthma , former smoker,    Pulmonary exam normal breath sounds clear to auscultation       Cardiovascular hypertension, Pt. on medications and Pt. on home beta blockers +CHF  Normal cardiovascular exam+ dysrhythmias Atrial Fibrillation  Rhythm:Regular Rate:Normal     Neuro/Psych Dementia Poor memory   GI/Hepatic Neg liver ROS, hiatal hernia, PUD, GERD  Medicated and Controlled,Melena Abdominal pain Abnormal duodenum on CT   Endo/Other  Hypothyroidism Hyperlipidemia  Renal/GU Renal InsufficiencyRenal disease  negative genitourinary   Musculoskeletal Wound dehiscence right hip   Abdominal   Peds  Hematology  (+) anemia , Anticoagulated   Anesthesia Other Findings   Reproductive/Obstetrics                             Anesthesia Physical  Anesthesia Plan  ASA: III  Anesthesia Plan: General   Post-op Pain Management:    Induction: Intravenous  PONV Risk Score and Plan: 3 and Ondansetron, Treatment may vary due to age or medical condition and Propofol infusion  Airway Management Planned: Oral ETT  Additional Equipment:   Intra-op Plan:   Post-operative Plan: Extubation in OR  Informed Consent: I have reviewed the patients History and Physical, chart, labs and discussed the procedure including the risks, benefits and alternatives for the proposed anesthesia with the patient or authorized representative who has indicated his/her understanding and acceptance.     Dental advisory given  Plan Discussed with:  CRNA and Surgeon  Anesthesia Plan Comments:        Anesthesia Quick Evaluation

## 2019-08-21 NOTE — Transfer of Care (Signed)
Immediate Anesthesia Transfer of Care Note  Patient: [name]  Procedure(s) Performed: IRRIGATION AND DEBRIDEMENT EXTREMITY (Right Thigh)  Patient Location: PACU  Anesthesia Type:General  Level of Consciousness: awake  Airway & Oxygen Therapy: Patient Spontanous Breathing and Patient connected to face mask oxygen  Post-op Assessment: Report given to RN and Post -op Vital signs reviewed and stable  Post vital signs: Reviewed and stable  Last Vitals:  Vitals Value Taken Time  BP 138/57 08/21/19 1630  Temp 36.1 C 08/21/19 1630  Pulse 57 08/21/19 1636  Resp 16 08/21/19 1630  SpO2 100 % 08/21/19 1636  Vitals shown include unvalidated device data.  Last Pain:  Vitals:   08/21/19 1630  TempSrc:   PainSc: Asleep         Complications: No apparent anesthesia complications

## 2019-08-21 NOTE — Care Plan (Signed)
Signed out to [facility], Dr. Rhona Leavens to assume care tomorrow morning.  Steffanie Dunn, DO 08/21/19 5:17 PM [facility]

## 2019-08-21 NOTE — Progress Notes (Signed)
Left message For Dennie Bible PA to call Niece Gavin Pound on [phone] to discuss today's procedure.

## 2019-08-21 NOTE — Brief Op Note (Signed)
08/18/2019 - 08/21/2019  3:03 PM  PATIENT:  [name]  86 y.o. female  PRE-OPERATIVE DIAGNOSIS:  Right lateral thigh wound dehiscence  POST-OPERATIVE DIAGNOSIS:  Right lateral thigh wound dehiscence with infected right total knee replacement and lateral plate and screws  PROCEDURE:  Procedure(s): IRRIGATION AND DEBRIDEMENT EXTREMITY (Right)  SURGEON:  Surgeon(s) and Role:    Durene Romans, MD - Primary  PHYSICIAN ASSISTANT: Dennie Bible, PA-C  ANESTHESIA:   general  EBL:  Minimal   BLOOD ADMINISTERED:none  DRAINS: none   LOCAL MEDICATIONS USED:  NONE  SPECIMEN:  No Specimen  DISPOSITION OF SPECIMEN:  N/A  COUNTS:  YES  TOURNIQUET:  * No tourniquets in log *  DICTATION: .Other Dictation: Dictation Number [id number]  PLAN OF CARE: Admit to inpatient   PATIENT DISPOSITION:  PACU - hemodynamically stable.   Delay start of Pharmacological VTE agent (>24hrs) due to surgical blood loss or risk of bleeding: no

## 2019-08-21 NOTE — Progress Notes (Signed)
eLink Physician-Brief Progress Note Patient Name: JOSLIN DOELL DOB: 15-Apr-1925 MRN: [id number]   Date of Service  08/21/2019  HPI/Events of Note  K+ 3.0  eICU Interventions  KCL 20 meq po Q 4 hours x 2        Hezzie Karim U Aarnav Steagall 08/21/2019, 9:44 PM

## 2019-08-21 NOTE — Op Note (Signed)
NAME: [name], [name] MEDICAL RECORD [id number] ACCOUNT [id number] DATE OF BIRTH:December 30, 1924 FACILITY: [facility] LOCATION: [facility] PHYSICIAN:Mazikeen Hehn Rosalia Hammers, MD  OPERATIVE REPORT  DATE OF PROCEDURE:  08/21/2019  PREOPERATIVE DIAGNOSES:   1.  Right lateral thigh wound dehiscence following open reduction internal fixation of a right distal periprosthetic femur fracture approximately 7 weeks ago with total knee replacement on this right side. 2.  Right lateral thigh wound dehiscence with infected right total knee arthroplasty as well as lateral hardware including plate and screws.  POSTOPERATIVE DIAGNOSES:   1.  Right lateral thigh wound dehiscence following open reduction internal fixation of a right distal periprosthetic femur fracture approximately 7 weeks ago with total knee replacement on this right side. 2.  Right lateral thigh wound dehiscence with infected right total knee arthroplasty as well as lateral hardware including plate and screws.  PROCEDURE:  Excisional and nonexcisional debridement of right knee laterally.  Excisional part was done sharply with a knife over an incision size of approximately 5 inches including nonviable tissue, muscle, tendon.  Nonexcisional debridement was  carried out with 3 liters normal saline solution and 450 mL of Irrisept chlorhexidine fluid.  SURGEON:  Durene Romans, MD  ASSISTANT:  Dennie Bible, PA-C.  Note that Ms. Adrian Blackwater was present for the entirety of the case from preoperative positioning, perioperative management of the operative extremity, general facilitation of case, and primary wound closure.  ANESTHESIA:  General.  ESTIMATED BLOOD LOSS:  Minimal.  BLOOD ADMINISTERED:  None.  DRAINS:  None.  SPECIMENS:  None taken.  INDICATIONS:  The patient is a 86 year old female with multiple medical issues, admitted to [facility] with GI bleed and failure to thrive issues.  She was noted to have a GI bleed and was treated for that.   However, it was identified with some  persistence with family that she had recent procedure on her right distal femur with an open wound.  Orthopedics was consulted based on these findings.  She was noted to have a 2-3 inch incision over the lateral side of the wound that had dehisced with  tunneling distally.  It was determined that she needed to go to the operating room to at least have this washed out and evaluated and hopefully with closure.  She had a recent history of open reduction internal fixation of a right distal periprosthetic  femur fracture in the beginning of March.  I have no idea when this wound began not to heal or what was done prior to this evaluation.  Risks and benefits and necessity of the procedure were discussed with her daughter.  At this point, she is early  enough in the procedural time that the hardware will need to remain intact to allow for the bone to heal no matter the findings intraoperatively.  Consent was obtained for benefit of wound management and evaluation.  PROCEDURE IN DETAIL:  The patient was brought to the operative theater.  Once adequate anesthesia, preoperative antibiotics had already been given, which were Ancef and Rocephin based on preoperative administration through the intensive care.  She was  positioned supine.  The right lower extremity was then prepped and draped in sterile fashion.  A timeout was performed identifying the patient, the planned procedure and extremity.  The old incision was excised.  I extended it distally for exposure.   When I was able to open up the knee, we identified that it did extend down further distally and in fact the fascial layer laterally had been compromised  with evidence of purulent fluid in this area.  I opened up the lateral aspect of her leg.  I debrided  sharply the skin, the subcutaneous tissue, nonviable tissue and then irrigated the knee and performed nonexcisional debridement with 3 liters normal saline solution  followed by 450 mL of Irrisept, chlorhexidine fluid.  I made a determination at this  point that we needed to discuss with family the situation involving this knee.  I reapproximated the fascia of the iliotibial band.  We then closed the skin with a combination of 2-0 Vicryl and staples.  The knee wound including some eschar over the  anterior aspect of the wound was dressed with Xeroform and a bulky dressing.  She was then brought to the recovery room in stable condition, tolerating the procedure well.  Postoperatively, now needing consult with infectious disease and determine some sort of suppressive antibiotic course for this 86 year old female who has multiple medical issues including dementia and this recent GI bleed.  Any advanced surgical  procedures would require removing her knee replacement as well as her lateral plate; however, she is only 7 weeks out from repair of her distal femur fracture.  This represents a very challenging case, all things considered.  CN/NUANCE  D:08/21/2019 T:08/21/2019 JOB:[id number]

## 2019-08-21 NOTE — Anesthesia Procedure Notes (Signed)
Procedure Name: Intubation Date/Time: 08/21/2019 3:39 PM Performed by: Claudina Lick, CRNA Pre-anesthesia Checklist: Patient identified, Emergency Drugs available, Suction available, Patient being monitored and Timeout performed Patient Re-evaluated:Patient Re-evaluated prior to induction Oxygen Delivery Method: Circle system utilized Preoxygenation: Pre-oxygenation with 100% oxygen Induction Type: IV induction Ventilation: Mask ventilation without difficulty Laryngoscope Size: Miller and 2 Grade View: Grade I Tube type: Oral Tube size: 7.0 mm Number of attempts: 1 Airway Equipment and Method: Stylet Placement Confirmation: ETT inserted through vocal cords under direct vision,  positive ETCO2 and breath sounds checked- equal and bilateral Secured at: 21 cm Tube secured with: Tape Dental Injury: Teeth and Oropharynx as per pre-operative assessment

## 2019-08-22 DIAGNOSIS — K264 Chronic or unspecified duodenal ulcer with hemorrhage: Secondary | ICD-10-CM

## 2019-08-22 DIAGNOSIS — Z96659 Presence of unspecified artificial knee joint: Secondary | ICD-10-CM

## 2019-08-22 DIAGNOSIS — Z8781 Personal history of (healed) traumatic fracture: Secondary | ICD-10-CM

## 2019-08-22 DIAGNOSIS — Z87891 Personal history of nicotine dependence: Secondary | ICD-10-CM

## 2019-08-22 DIAGNOSIS — T8453XA Infection and inflammatory reaction due to internal right knee prosthesis, initial encounter: Principal | ICD-10-CM

## 2019-08-22 DIAGNOSIS — Z96651 Presence of right artificial knee joint: Secondary | ICD-10-CM

## 2019-08-22 DIAGNOSIS — I4891 Unspecified atrial fibrillation: Secondary | ICD-10-CM

## 2019-08-22 DIAGNOSIS — J479 Bronchiectasis, uncomplicated: Secondary | ICD-10-CM

## 2019-08-22 DIAGNOSIS — F0390 Unspecified dementia without behavioral disturbance: Secondary | ICD-10-CM | POA: Diagnosis not present

## 2019-08-22 DIAGNOSIS — T8130XA Disruption of wound, unspecified, initial encounter: Secondary | ICD-10-CM | POA: Diagnosis not present

## 2019-08-22 DIAGNOSIS — T847XXA Infection and inflammatory reaction due to other internal orthopedic prosthetic devices, implants and grafts, initial encounter: Secondary | ICD-10-CM

## 2019-08-22 DIAGNOSIS — T8459XS Infection and inflammatory reaction due to other internal joint prosthesis, sequela: Secondary | ICD-10-CM

## 2019-08-22 DIAGNOSIS — N189 Chronic kidney disease, unspecified: Secondary | ICD-10-CM

## 2019-08-22 DIAGNOSIS — Z888 Allergy status to other drugs, medicaments and biological substances status: Secondary | ICD-10-CM

## 2019-08-22 LAB — BASIC METABOLIC PANEL
Anion gap: 9 (ref 5–15)
BUN: 19 mg/dL (ref 8–23)
CO2: 20 mmol/L — ABNORMAL LOW (ref 22–32)
Calcium: 8 mg/dL — ABNORMAL LOW (ref 8.9–10.3)
Chloride: 110 mmol/L (ref 98–111)
Creatinine, Ser: 1.19 mg/dL — ABNORMAL HIGH (ref 0.44–1.00)
GFR calc Af Amer: 46 mL/min — ABNORMAL LOW (ref >60)
GFR calc non Af Amer: 40 mL/min — ABNORMAL LOW (ref >60)
Glucose, Bld: 98 mg/dL (ref 70–99)
Potassium: 3.1 mmol/L — ABNORMAL LOW (ref 3.5–5.1)
Sodium: 139 mmol/L (ref 135–145)

## 2019-08-22 LAB — CBC
HCT: 33.4 % — ABNORMAL LOW (ref 36.0–46.0)
Hemoglobin: 11.3 g/dL — ABNORMAL LOW (ref 12.0–15.0)
MCH: 29.1 pg (ref 26.0–34.0)
MCHC: 33.8 g/dL (ref 30.0–36.0)
MCV: 86.1 fL (ref 80.0–100.0)
Platelets: 147 10*3/uL — ABNORMAL LOW (ref 150–400)
RBC: 3.88 MIL/uL (ref 3.87–5.11)
RDW: 16.4 % — ABNORMAL HIGH (ref 11.5–15.5)
WBC: 9.3 10*3/uL (ref 4.0–10.5)
nRBC: 0 % (ref 0.0–0.2)

## 2019-08-22 MED ORDER — POTASSIUM CHLORIDE CRYS ER 20 MEQ PO TBCR
20.0000 meq | EXTENDED_RELEASE_TABLET | ORAL | Status: AC
  Administered 2019-08-22 (×2): 20 meq via ORAL
  Filled 2019-08-22 (×2): qty 1

## 2019-08-22 MED ORDER — DOXYCYCLINE HYCLATE 100 MG PO TABS
100.0000 mg | ORAL_TABLET | Freq: Two times a day (BID) | ORAL | Status: DC
  Administered 2019-08-22 – 2019-08-24 (×5): 100 mg via ORAL
  Filled 2019-08-22 (×5): qty 1

## 2019-08-22 MED ORDER — SODIUM CHLORIDE 0.9 % IV SOLN
2.0000 g | Freq: Every day | INTRAVENOUS | Status: DC
  Administered 2019-08-22 – 2019-08-23 (×2): 2 g via INTRAVENOUS
  Filled 2019-08-22: qty 2
  Filled 2019-08-22 (×2): qty 20
  Filled 2019-08-22: qty 2

## 2019-08-22 MED ORDER — SODIUM CHLORIDE 0.9% FLUSH
10.0000 mL | INTRAVENOUS | Status: DC | PRN
  Administered 2019-08-24: 10 mL

## 2019-08-22 MED ORDER — SODIUM CHLORIDE 0.9% FLUSH
10.0000 mL | Freq: Two times a day (BID) | INTRAVENOUS | Status: DC
  Administered 2019-08-22 – 2019-08-24 (×5): 10 mL

## 2019-08-22 NOTE — Consult Note (Addendum)
Date of Admission:  08/18/2019          Reason for Consult: Prosthetic knee infection and hardware infection    Referring Provider: Dr. Charlann Boxer   Assessment:   1. Right lateral thigh wound dehiscence with 2. 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 GI bleed 2. Dementia 3. Atrial fibrillation   Plan:  4. Would change her to ceftriaxone 2 g daily and oral doxycycline 100 mg twice daily to avoid nephrotoxicity of vancomycin 5. Now on giving her 6 weeks of systemic antibiotics followed by indefinite chronic oral suppressive therapy hopefully by picking the right drugs for her infection    Principal Problem:   Non-healing surgical wound Active Problems:   Atrial fibrillation (HCC)   GI bleed   Enteritis   AKI (acute kidney injury) (HCC)   Hypotension   Duodenitis   Gastric ulcer without hemorrhage or perforation   Duodenal ulcer   Scheduled Meds: . acetaminophen  650 mg Oral Once  . amiodarone  100 mg Oral Daily  . ARIPiprazole  5 mg Oral Daily  . busPIRone  5 mg Oral QHS  . Chlorhexidine Gluconate Cloth  6 each Topical Daily  . docusate sodium  100 mg Oral BID  . doxycycline  100 mg Oral Q12H  . levothyroxine  50 mcg Oral QAC breakfast  . magic mouthwash  5 mL Oral TID  . memantine  5 mg Oral Daily  . mometasone-formoterol  2 puff Inhalation BID  . pantoprazole (PROTONIX) IV  40 mg Intravenous Q12H   Continuous Infusions: . sodium chloride Stopped (08/19/19 0245)  . sodium chloride Stopped (08/22/19 0345)  . cefTRIAXone (ROCEPHIN)  IV    . methocarbamol (ROBAXIN) IV    . norepinephrine (LEVOPHED) Adult infusion Stopped (08/20/19 1128)   PRN Meds:.acetaminophen, albuterol, diphenhydrAMINE, docusate sodium, Gerhardt's butt cream, magnesium citrate, menthol-cetylpyridinium **OR** phenol, methocarbamol **OR** methocarbamol (ROBAXIN) IV, ondansetron **OR** ondansetron (ZOFRAN) IV, polyethylene glycol, traMADol, white petrolatum  HPI:  [name] is a 86 y.o. female with history of dementia atrial fibrillation and bronchiectasis, chronic kidney disease, prosthetic joint and recent fracture with ORIF on June 26, 2019 who was admitted to the hospital with abdominal pain melena and was found to be suffering from a GI bleed with shock.  He has been had endoscopic by GI which is reviewed duodenal ulcers.  She had a prosthetic knee placed at [facility] orthopedics.  Recently March she suffered a periprosthetic femur fracture and had ORIF on June 26, 2019.  Apparently developed dehiscence of this wound wound foot from the ORIF which had begun to tunnel.  Her niece who was with her at the bedside pointed this out and orthopedics were consulted found that she had dehiscence of the wound deep to the hardware and with involvement of her prosthetic knee joint.  Of note the patient had already been in the hospital in late March with high fevers and concern for infection at the site of her periprosthetic fracture.  She is complaining of pain there as well and had a high temperature.  She was evaluated in the ER and ultimately admitted to the hospitalist service.  Her knee was apparently read at the time it is not appear that she was evaluated by orthopedics.  She was treated with ceftriaxone in the hospital for what was thought to be urinary tract infection plus or minus "superficial surgical site infection.  He was instructed to follow-up with orthopedic surgeon Dr. Case  as soon as possible discharge.  Ended the did see the patient on April 7 and noted that her leg immobilizer had been soiled.  He removed his staples and placed her on doxycycline.  While here at [facility] she was given a diagnosis of possible gastroenteritis based on CT scan she was given ciprofloxacin then changed over to ceftriaxone and metronidazole.  Dr. Charlann Boxer took her to the operating room yesterday and performed irrigation and debridement of the knee.  Her prosthetic fracture still  required that the hardware remain in place.  Cultures were not taken the OR due to Dr. Nilsa Nutting concerns that a organism would not be isolated given the antibiotics and she had been on.  He may indeed have been correct that nothing would have grown but if something did it would allow [place] more narrow targeted therapy.  We will change her over to ceftriaxone to give her aggressive coverage for streptococcal species and use doxycycline 100 mg twice daily to cover for MRSA.  I do not want to subject her to vancomycin and I think daptomycin will also likely be highly costly.  Clearly she should get 6 weeks of systemic antibiotics followed by indefinite oral antibiotic suppression.      Review of Systems: Review of Systems  Unable to perform ROS: Dementia    Past Medical History:  Diagnosis Date  . Abdominal pain   . Atrophic vaginitis   . Cellulitis 06/2019  . CHF (congestive heart failure) (HCC)   . Colon polyps    Dr Ewing Schlein  . Diverticulosis    Dr Ewing Schlein  . Essential hypertension, benign   . Gastritis   . Hiatal hernia   . Insomnia, idiopathic   . Malaise and fatigue   . Osteoarthrosis and allied disorders   . Other and unspecified hyperlipidemia   . Thoracic scoliosis     Social History   Tobacco Use  . Smoking status: Former Smoker    Packs/day: 0.10    Years: 5.00    Pack years: 0.50    Types: Cigarettes  . Smokeless tobacco: Never Used  . Tobacco comment: pt states only smoke socially in teens  Substance Use Topics  . Alcohol use: No  . Drug use: No    Family History  Problem Relation Age of Onset  . Heart disease Father 51  . Cancer Sister    Allergies  Allergen Reactions  . Simvastatin Other (See Comments)    Leg pain- "Allergic," per MAR  . Ace Inhibitors Cough    "Allergic," per MAR    OBJECTIVE: Blood pressure (!) 93/51, pulse 75, temperature 97.6 F (36.4 C), temperature source Oral, resp. rate 13, height 5\' 6"  (1.676 m), weight 58.4 kg, SpO2 100  %.  Physical Exam Constitutional:      General: She is not in acute distress.    Appearance: She is well-developed. She is not ill-appearing or diaphoretic.  HENT:     Head: Normocephalic.     Right Ear: Hearing and external ear normal.     Left Ear: Hearing and external ear normal.     Nose: No nasal deformity or rhinorrhea.  Eyes:     General: No scleral icterus.    Extraocular Movements: Extraocular movements intact.     Conjunctiva/sclera: Conjunctivae normal.     Right eye: Right conjunctiva is not injected.     Left eye: Left conjunctiva is not injected.  Neck:     Vascular: No JVD.  Cardiovascular:  Rate and Rhythm: Normal rate and regular rhythm.     Heart sounds: Normal heart sounds, S1 normal and S2 normal. No murmur. No friction rub.  Pulmonary:     Effort: Pulmonary effort is normal. No respiratory distress.     Breath sounds: No wheezing.  Abdominal:     General: Bowel sounds are normal. There is no distension.     Palpations: Abdomen is soft.  Musculoskeletal:        General: Normal range of motion.     Right shoulder: Normal.     Left shoulder: Normal.     Cervical back: Normal range of motion and neck supple.     Right hip: Normal.     Left hip: Normal.     Right knee: Normal.     Left knee: Normal.  Lymphadenopathy:     Head:     Right side of head: No submandibular, preauricular or posterior auricular adenopathy.     Left side of head: No submandibular, preauricular or posterior auricular adenopathy.     Cervical: No cervical adenopathy.     Right cervical: No superficial or deep cervical adenopathy.    Left cervical: No superficial or deep cervical adenopathy.  Skin:    General: Skin is warm and dry.     Coloration: Skin is not pale.     Findings: No abrasion, bruising, ecchymosis, erythema, lesion or rash.     Nails: There is no clubbing.  Neurological:     Mental Status: She is alert and oriented to person, place, and time.     Sensory: No  sensory deficit.     Coordination: Coordination normal.     Gait: Gait normal.  Psychiatric:        Attention and Perception: She is attentive.        Mood and Affect: Mood normal.        Speech: Speech is delayed.        Behavior: Behavior is slowed. Behavior is cooperative.        Cognition and Memory: Cognition is impaired. Memory is impaired.    Right knee is wrapped  Lab Results Lab Results  Component Value Date   WBC 9.3 08/22/2019   HGB 11.3 (L) 08/22/2019   HCT 33.4 (L) 08/22/2019   MCV 86.1 08/22/2019   PLT 147 (L) 08/22/2019    Lab Results  Component Value Date   CREATININE 1.19 (H) 08/22/2019   BUN 19 08/22/2019   NA 139 08/22/2019   K 3.1 (L) 08/22/2019   CL 110 08/22/2019   CO2 20 (L) 08/22/2019    Lab Results  Component Value Date   ALT 25 08/18/2019   AST 31 08/18/2019   ALKPHOS 88 08/18/2019   BILITOT 0.7 08/18/2019     Microbiology: Recent Results (from the past 240 hour(s))  Urine culture     Status: Abnormal   Collection Time: 08/18/19  6:57 PM   Specimen: Urine, Random  Result Value Ref Range Status   Specimen Description URINE, RANDOM  Final   Special Requests NONE  Final   Culture (A)  Final    40,000 COLONIES/mL DIPHTHEROIDS(CORYNEBACTERIUM SPECIES) Standardized susceptibility testing for this organism is not available. Performed at [facility], [address] [place], [place] [address]    Report Status 08/19/2019 FINAL  Final  Blood culture (routine x 2)     Status: None (Preliminary result)   Collection Time: 08/18/19  7:04 PM   Specimen: BLOOD  RIGHT ARM  Result Value Ref Range Status   Specimen Description BLOOD RIGHT ARM  Final   Special Requests   Final    BOTTLES DRAWN AEROBIC AND ANAEROBIC Blood Culture results may not be optimal due to an inadequate volume of blood received in culture bottles   Culture   Final    NO GROWTH 4 DAYS Performed at [facility], [address], [place], [place] [address]    Report  Status PENDING  Incomplete  Respiratory Panel by RT PCR (Flu A&B, Covid) - Nasopharyngeal Swab     Status: None   Collection Time: 08/18/19 10:57 PM   Specimen: Nasopharyngeal Swab  Result Value Ref Range Status   SARS Coronavirus 2 by RT PCR NEGATIVE NEGATIVE Final    Comment: (NOTE) SARS-CoV-2 target nucleic acids are NOT DETECTED. The SARS-CoV-2 RNA is generally detectable in upper respiratoy specimens during the acute phase of infection. The lowest concentration of SARS-CoV-2 viral copies this assay can detect is 131 copies/mL. A negative result does not preclude SARS-Cov-2 infection and should not be used as the sole basis for treatment or other patient management decisions. A negative result may occur with  improper specimen collection/handling, submission of specimen other than nasopharyngeal swab, presence of viral mutation(s) within the areas targeted by this assay, and inadequate number of viral copies (<131 copies/mL). A negative result must be combined with clinical observations, patient history, and epidemiological information. The expected result is Negative. Fact Sheet for Patients:  https://www.moore.com/ Fact Sheet for Healthcare Providers:  https://www.young.biz/ This test is not yet ap proved or cleared by the [place] FDA and  has been authorized for detection and/or diagnosis of SARS-CoV-2 by FDA under an Emergency Use Authorization (EUA). This EUA will remain  in effect (meaning this test can be used) for the duration of the COVID-19 declaration under Section 564(b)(1) of the Act, 21 U.S.C. section 360bbb-3(b)(1), unless the authorization is terminated or revoked sooner.    Influenza A by PCR NEGATIVE NEGATIVE Final   Influenza B by PCR NEGATIVE NEGATIVE Final    Comment: (NOTE) The Xpert Xpress SARS-CoV-2/FLU/RSV assay is intended as an aid in  the diagnosis of influenza from Nasopharyngeal swab specimens and    should not be used as a sole basis for treatment. Nasal washings and  aspirates are unacceptable for Xpert Xpress SARS-CoV-2/FLU/RSV  testing. Fact Sheet for Patients: https://www.moore.com/ Fact Sheet for Healthcare Providers: https://www.young.biz/ This test is not yet approved or cleared by the [place] FDA and  has been authorized for detection and/or diagnosis of SARS-CoV-2 by  FDA under an Emergency Use Authorization (EUA). This EUA will remain  in effect (meaning this test can be used) for the duration of the  Covid-19 declaration under Section 564(b)(1) of the Act, 21  U.S.C. section 360bbb-3(b)(1), unless the authorization is  terminated or revoked. Performed at [facility], [address] [place], [place] [address]   Blood culture (routine x 2)     Status: None (Preliminary result)   Collection Time: 08/18/19 11:08 PM   Specimen: BLOOD  Result Value Ref Range Status   Specimen Description BLOOD LEFT ANTECUBITAL  Final   Special Requests   Final    BOTTLES DRAWN AEROBIC AND ANAEROBIC Blood Culture results may not be optimal due to an excessive volume of blood received in culture bottles   Culture   Final    NO GROWTH 4 DAYS Performed at [facility], [address], [place], [place] [address]    Report Status PENDING  Incomplete  MRSA PCR Screening     Status: None   Collection Time: 08/19/19  8:18 PM   Specimen: Nasal Mucosa; Nasopharyngeal  Result Value Ref Range Status   MRSA by PCR NEGATIVE NEGATIVE Final    Comment:        The GeneXpert MRSA Assay (FDA approved for NASAL specimens only), is one component of a comprehensive MRSA colonization surveillance program. It is not intended to diagnose MRSA infection nor to guide or monitor treatment for MRSA infections. Performed at [facility], [place] [address], [place], [place] [address]   Gastrointestinal Panel by PCR , Stool     Status: None    Collection Time: 08/20/19  4:09 AM  Result Value Ref Range Status   Campylobacter species NOT DETECTED NOT DETECTED Final   Plesimonas shigelloides NOT DETECTED NOT DETECTED Final   Salmonella species NOT DETECTED NOT DETECTED Final   Yersinia enterocolitica NOT DETECTED NOT DETECTED Final   Vibrio species NOT DETECTED NOT DETECTED Final   Vibrio cholerae NOT DETECTED NOT DETECTED Final   Enteroaggregative E coli (EAEC) NOT DETECTED NOT DETECTED Final   Enteropathogenic E coli (EPEC) NOT DETECTED NOT DETECTED Final   Enterotoxigenic E coli (ETEC) NOT DETECTED NOT DETECTED Final   Shiga like toxin producing E coli (STEC) NOT DETECTED NOT DETECTED Final   Shigella/Enteroinvasive E coli (EIEC) NOT DETECTED NOT DETECTED Final   Cryptosporidium NOT DETECTED NOT DETECTED Final   Cyclospora cayetanensis NOT DETECTED NOT DETECTED Final   Entamoeba histolytica NOT DETECTED NOT DETECTED Final   Giardia lamblia NOT DETECTED NOT DETECTED Final   Adenovirus F40/41 NOT DETECTED NOT DETECTED Final   Astrovirus NOT DETECTED NOT DETECTED Final   Norovirus GI/GII NOT DETECTED NOT DETECTED Final   Rotavirus A NOT DETECTED NOT DETECTED Final   Sapovirus (I, II, IV, and V) NOT DETECTED NOT DETECTED Final    Comment: Performed at [facility], [address] [place], [place] [address]  Aerobic/Anaerobic Culture (surgical/deep wound)     Status: None (Preliminary result)   Collection Time: 08/20/19 11:00 AM   Specimen: Wound  Result Value Ref Range Status   Specimen Description WOUND  Final   Special Requests NONE  Final   Gram Stain NO WBC SEEN NO ORGANISMS SEEN   Final   Culture   Final    CULTURE REINCUBATED FOR BETTER GROWTH Performed at [facility], [address] [place], [place] [address]    Report Status PENDING  Incomplete  Surgical PCR screen     Status: None   Collection Time: 08/20/19 11:32 AM   Specimen: Nasal Mucosa; Nasal Swab  Result Value Ref Range Status   MRSA,  PCR NEGATIVE NEGATIVE Final   Staphylococcus aureus NEGATIVE NEGATIVE Final    Comment: (NOTE) The Xpert SA Assay (FDA approved for NASAL specimens in patients 64 years of age and older), is one component of a comprehensive surveillance program. It is not intended to diagnose infection nor to guide or monitor treatment. Performed at [facility], [place] [address] [place], [place] [address]     Alcide Evener, [place] [facility] [phone] pager  08/22/2019, 11:59 AM

## 2019-08-22 NOTE — Progress Notes (Signed)
Pt received from 2H to 4E25. Oriented to room and call bell. CHG wipes applied. VSS. Tele box applied and CCMD called. Call bell in reach. Will continue to monitor.  Hazle Nordmann, RN

## 2019-08-22 NOTE — Progress Notes (Signed)
eLink Physician-Brief Progress Note Patient Name: [name] DOB: June 06, 1924 MRN: [id number]   Date of Service  08/22/2019  HPI/Events of Note  K+ 3.1  eICU Interventions  KCL 20 meq po Q 4 hours x 2 doses.        Thomasene Lot [name] 08/22/2019, 5:54 AM

## 2019-08-22 NOTE — Progress Notes (Signed)
PROGRESS NOTE  [name]:[id number] DOB: [birth year] DOA: 08/18/2019 PCP: Janora Norlander, DO   LOS: 4 days   Brief Narrative / Interim history: 86 year old female with CHF (unknown type), A. fib not on AC, asthma, bronchiectasis, chronic kidney disease stage III, hypothyroidism, HTN, HLD, mild dementia, recent ORIF 06/26/2019 for periprosthetic femur fracture who presents with abdominal pain with melena as well as several episodes of bright red blood per rectum.  She lives in [place] home.  On admission she was hypotensive and admitted to the ICU.  CT scan showed small bowel enteritis.  She was initially admitted to the hospitalist however due to persistent hypotension she was sent to the ICU for pressors.  Hospital course complicated by wound dehiscence at the right prosthetic knee and concern for sepsis, she was taken to the OR on 4/27 by orthopedic surgery.  ID also consulted.  She is transferred back to the hospitalist service on 4/28.  Subjective / 24h Interval events: She is doing well this morning, does complain of right knee pain but bearable  Assessment & Plan: Principal Problem Hypovolemic shock due to GI bleed-gastroenterology was consulted and patient underwent an EGD which showed duodenal ulcers.  She was placed on PPI, continue.  Continue to monitor CBC, they appear stable and she has not had any further bleed  Active Problems Septic right prosthetic knee/wound dehiscence-recent ORIF for periprosthetic femur fracture at [facility].  Orthopedic surgery consulted, she underwent washout in the OR on 4/27 which confirmed septic arthritis.  Cultures sent, ID consulted  Acute kidney injury on chronic kidney disease stage IIIa-improving, AKI was likely due to ATN from hypotension  CHF/PAF/HTN/HLD-continue amiodarone, in sinus  History of asthma-continue inhalers, no wheezing  Hypothyroidism-continue Synthroid   Scheduled Meds: . acetaminophen  650 mg Oral  Once  . amiodarone  100 mg Oral Daily  . ARIPiprazole  5 mg Oral Daily  . busPIRone  5 mg Oral QHS  . Chlorhexidine Gluconate Cloth  6 each Topical Daily  . docusate sodium  100 mg Oral BID  . doxycycline  100 mg Oral Q12H  . levothyroxine  50 mcg Oral QAC breakfast  . magic mouthwash  5 mL Oral TID  . memantine  5 mg Oral Daily  . mometasone-formoterol  2 puff Inhalation BID  . pantoprazole (PROTONIX) IV  40 mg Intravenous Q12H   Continuous Infusions: . sodium chloride Stopped (08/19/19 0245)  . sodium chloride Stopped (08/22/19 0345)  . cefTRIAXone (ROCEPHIN)  IV    . methocarbamol (ROBAXIN) IV    . norepinephrine (LEVOPHED) Adult infusion Stopped (08/20/19 1128)   PRN Meds:.acetaminophen, albuterol, diphenhydrAMINE, docusate sodium, Gerhardt's butt cream, magnesium citrate, menthol-cetylpyridinium **OR** phenol, methocarbamol **OR** methocarbamol (ROBAXIN) IV, ondansetron **OR** ondansetron (ZOFRAN) IV, polyethylene glycol, traMADol, white petrolatum  DVT prophylaxis: SCDs Code Status: Full Code Family Communication: no family at bedside  Patient admitted from: SNF Anticipated d/c place: SNF Barriers to d/c: ID plan pending, cultures pending   Consultants:  ID, orthopedic surgery, PCCM  Procedures:  OR I&D right knee 4/27 EGD  Microbiology  None   Antimicrobials: Ceftriaxone / Doxycycline 4/28 >>    Objective: Vitals:   08/22/19 1000 08/22/19 1100 08/22/19 1148 08/22/19 1200  BP: (!) 101/43 (!) 93/51  114/61  Pulse: (!) 56 75  (!) 104  Resp: 16 13  (!) 24  Temp:   97.6 F (36.4 C)   TempSrc:   Oral   SpO2: 99% 100%  100%  Weight:  Height:        Intake/Output Summary (Last 24 hours) at 08/22/2019 1339 Last data filed at 08/22/2019 1200 Gross per 24 hour  Intake 1548.7 ml  Output 975 ml  Net 573.7 ml   Filed Weights   08/20/19 1345 08/21/19 0500 08/22/19 0600  Weight: 57.7 kg 57.8 kg 58.4 kg    Examination:  Constitutional: NAD Eyes: no  scleral icterus ENMT: Mucous membranes are moist.  Neck: normal, supple Respiratory: clear to auscultation bilaterally, no wheezing, no crackles. Normal respiratory effort.  Cardiovascular: Regular rate and rhythm, no murmurs / rubs / gallops.  Abdomen: non distended, no tenderness. Bowel sounds positive.  Musculoskeletal: no clubbing / cyanosis.  Skin: no rashes Neurologic: CN 2-12 grossly intact. Strength 5/5 in all 4.   Data Reviewed: I have independently reviewed following labs and imaging studies   CBC: Recent Labs  Lab 08/18/19 1645 08/19/19 0154 08/19/19 2107 08/20/19 0332 08/20/19 0823 08/21/19 0811 08/22/19 0330  WBC 13.0*   < > 12.7* 13.0* 10.2 8.1 9.3  NEUTROABS 10.8*  --   --   --   --   --   --   HGB 11.1*   < > 10.4* 10.6* 10.2* 11.2* 11.3*  HCT 34.2*   < > 32.7* 32.5* 31.8* 34.5* 33.4*  MCV 88.4   < > 88.1 86.4 88.1 88.0 86.1  PLT 227   < > 242 252 193 150 147*   < > = values in this interval not displayed.   Basic Metabolic Panel: Recent Labs  Lab 08/19/19 0745 08/20/19 0332 08/20/19 1854 08/21/19 1851 08/22/19 0330  NA 142 143 141 137 139  K 3.8 3.7 3.1* 3.0* 3.1*  CL 119* 118* 114* 107 110  CO2 15* 17* 17* 22 20*  GLUCOSE 79 90 103* 119* 98  BUN 76* 51* 36* 23 19  CREATININE 1.94* 1.65* 1.53* 1.36* 1.19*  CALCIUM 7.8* 8.3* 8.0* 7.9* 8.0*  MG  --  1.4*  --   --   --   PHOS  --  3.1  --   --   --    Liver Function Tests: Recent Labs  Lab 08/18/19 1645  AST 31  ALT 25  ALKPHOS 88  BILITOT 0.7  PROT 4.5*  ALBUMIN 2.3*   Coagulation Profile: Recent Labs  Lab 08/20/19 0935  INR 1.3*   HbA1C: No results for input(s): HGBA1C in the last 72 hours. CBG: Recent Labs  Lab 08/21/19 0811  GLUCAP 107*    Recent Results (from the past 240 hour(s))  Urine culture     Status: Abnormal   Collection Time: 08/18/19  6:57 PM   Specimen: Urine, Random  Result Value Ref Range Status   Specimen Description URINE, RANDOM  Final   Special  Requests NONE  Final   Culture (A)  Final    40,000 COLONIES/mL DIPHTHEROIDS(CORYNEBACTERIUM SPECIES) Standardized susceptibility testing for this organism is not available. Performed at [facility], [address] [place], [place] [address]    Report Status 08/19/2019 FINAL  Final  Blood culture (routine x 2)     Status: None (Preliminary result)   Collection Time: 08/18/19  7:04 PM   Specimen: BLOOD RIGHT ARM  Result Value Ref Range Status   Specimen Description BLOOD RIGHT ARM  Final   Special Requests   Final    BOTTLES DRAWN AEROBIC AND ANAEROBIC Blood Culture results may not be optimal due to an inadequate volume of blood received in  culture bottles   Culture   Final    NO GROWTH 4 DAYS Performed at [facility], [address] [place], [place] [address]    Report Status PENDING  Incomplete  Respiratory Panel by RT PCR (Flu A&B, Covid) - Nasopharyngeal Swab     Status: None   Collection Time: 08/18/19 10:57 PM   Specimen: Nasopharyngeal Swab  Result Value Ref Range Status   SARS Coronavirus 2 by RT PCR NEGATIVE NEGATIVE Final    Comment: (NOTE) SARS-CoV-2 target nucleic acids are NOT DETECTED. The SARS-CoV-2 RNA is generally detectable in upper respiratoy specimens during the acute phase of infection. The lowest concentration of SARS-CoV-2 viral copies this assay can detect is 131 copies/mL. A negative result does not preclude SARS-Cov-2 infection and should not be used as the sole basis for treatment or other patient management decisions. A negative result may occur with  improper specimen collection/handling, submission of specimen other than nasopharyngeal swab, presence of viral mutation(s) within the areas targeted by this assay, and inadequate number of viral copies (<131 copies/mL). A negative result must be combined with clinical observations, patient history, and epidemiological information. The expected result is Negative. Fact Sheet for Patients:    https://www.moore.com/ Fact Sheet for Healthcare Providers:  https://www.young.biz/ This test is not yet ap proved or cleared by the [place] FDA and  has been authorized for detection and/or diagnosis of SARS-CoV-2 by FDA under an Emergency Use Authorization (EUA). This EUA will remain  in effect (meaning this test can be used) for the duration of the COVID-19 declaration under Section 564(b)(1) of the Act, 21 U.S.C. section 360bbb-3(b)(1), unless the authorization is terminated or revoked sooner.    Influenza A by PCR NEGATIVE NEGATIVE Final   Influenza B by PCR NEGATIVE NEGATIVE Final    Comment: (NOTE) The Xpert Xpress SARS-CoV-2/FLU/RSV assay is intended as an aid in  the diagnosis of influenza from Nasopharyngeal swab specimens and  should not be used as a sole basis for treatment. Nasal washings and  aspirates are unacceptable for Xpert Xpress SARS-CoV-2/FLU/RSV  testing. Fact Sheet for Patients: https://www.moore.com/ Fact Sheet for Healthcare Providers: https://www.young.biz/ This test is not yet approved or cleared by the [place] FDA and  has been authorized for detection and/or diagnosis of SARS-CoV-2 by  FDA under an Emergency Use Authorization (EUA). This EUA will remain  in effect (meaning this test can be used) for the duration of the  Covid-19 declaration under Section 564(b)(1) of the Act, 21  U.S.C. section 360bbb-3(b)(1), unless the authorization is  terminated or revoked. Performed at [facility], [address], [place], [place] [address]   Blood culture (routine x 2)     Status: None (Preliminary result)   Collection Time: 08/18/19 11:08 PM   Specimen: BLOOD  Result Value Ref Range Status   Specimen Description BLOOD LEFT ANTECUBITAL  Final   Special Requests   Final    BOTTLES DRAWN AEROBIC AND ANAEROBIC Blood Culture results may not be optimal due to an  excessive volume of blood received in culture bottles   Culture   Final    NO GROWTH 4 DAYS Performed at [facility], [address], [place], [place] [address]    Report Status PENDING  Incomplete  MRSA PCR Screening     Status: None   Collection Time: 08/19/19  8:18 PM   Specimen: Nasal Mucosa; Nasopharyngeal  Result Value Ref Range Status   MRSA by PCR  NEGATIVE NEGATIVE Final    Comment:        The GeneXpert MRSA Assay (FDA approved for NASAL specimens only), is one component of a comprehensive MRSA colonization surveillance program. It is not intended to diagnose MRSA infection nor to guide or monitor treatment for MRSA infections. Performed at [facility], [address], [place], [place] [address]   Gastrointestinal Panel by PCR , Stool     Status: None   Collection Time: 08/20/19  4:09 AM  Result Value Ref Range Status   Campylobacter species NOT DETECTED NOT DETECTED Final   Plesimonas shigelloides NOT DETECTED NOT DETECTED Final   Salmonella species NOT DETECTED NOT DETECTED Final   Yersinia enterocolitica NOT DETECTED NOT DETECTED Final   Vibrio species NOT DETECTED NOT DETECTED Final   Vibrio cholerae NOT DETECTED NOT DETECTED Final   Enteroaggregative E coli (EAEC) NOT DETECTED NOT DETECTED Final   Enteropathogenic E coli (EPEC) NOT DETECTED NOT DETECTED Final   Enterotoxigenic E coli (ETEC) NOT DETECTED NOT DETECTED Final   Shiga like toxin producing E coli (STEC) NOT DETECTED NOT DETECTED Final   Shigella/Enteroinvasive E coli (EIEC) NOT DETECTED NOT DETECTED Final   Cryptosporidium NOT DETECTED NOT DETECTED Final   Cyclospora cayetanensis NOT DETECTED NOT DETECTED Final   Entamoeba histolytica NOT DETECTED NOT DETECTED Final   Giardia lamblia NOT DETECTED NOT DETECTED Final   Adenovirus F40/41 NOT DETECTED NOT DETECTED Final   Astrovirus NOT DETECTED NOT DETECTED Final   Norovirus GI/GII NOT DETECTED NOT DETECTED Final   Rotavirus A NOT  DETECTED NOT DETECTED Final   Sapovirus (I, II, IV, and V) NOT DETECTED NOT DETECTED Final    Comment: Performed at [facility], [address] [place], [place] [address]  Aerobic/Anaerobic Culture (surgical/deep wound)     Status: None (Preliminary result)   Collection Time: 08/20/19 11:00 AM   Specimen: Wound  Result Value Ref Range Status   Specimen Description WOUND  Final   Special Requests NONE  Final   Gram Stain NO WBC SEEN NO ORGANISMS SEEN   Final   Culture   Final    CULTURE REINCUBATED FOR BETTER GROWTH Performed at [facility], [address], [place], [place] [address]    Report Status PENDING  Incomplete  Surgical PCR screen     Status: None   Collection Time: 08/20/19 11:32 AM   Specimen: Nasal Mucosa; Nasal Swab  Result Value Ref Range Status   MRSA, PCR NEGATIVE NEGATIVE Final   Staphylococcus aureus NEGATIVE NEGATIVE Final    Comment: (NOTE) The Xpert SA Assay (FDA approved for NASAL specimens in patients 13 years of age and older), is one component of a comprehensive surveillance program. It is not intended to diagnose infection nor to guide or monitor treatment. Performed at [facility], [address] [place], [place] [address]      Radiology Studies: [place] EKG SITE RITE  Result Date: 08/21/2019 If [facility] image not attached, placement could not be confirmed due to current cardiac rhythm.  Pamella Pert, MD, PhD [facility]  Between 7 am - 7 pm I am available, please contact me via Amion or Securechat  Between 7 pm - 7 am I am not available, please contact night coverage MD/APP via Amion

## 2019-08-22 NOTE — Evaluation (Signed)
Physical Therapy Evaluation Patient Details Name: [name] MRN: [id number] DOB: June 15, 1924 Today's Date: 08/22/2019   History of Present Illness  86 year old female hx CHF (no echo in chart), Afib not on AC, asthma, bronchiectasis, CKD 3, hypothyroidism, HTN, HLD , dementia and recent ORIF 06/26/2019 for periprosthetic femur fracture who presents with abdominal pain for a few days associated with melena for the last day. now s/p EGD, admitted to ICU for pressors due to continued hypotension after fluid resusitation w/ 3.5L.  Found to have wound dehiscence R ORIF and went for I&D on 08/21/19.  Clinical Impression  Patient presents with decreased mobility due to pain, weakness and history of falls.  Currently mod A of 2 for safety with OOB.  She will benefit from skilled PT in the acute setting prior to d/c back to SNF level rehab.     Follow Up Recommendations SNF;Supervision/Assistance - 24 hour    Equipment Recommendations  None recommended by PT    Recommendations for Other Services       Precautions / Restrictions Precautions Precautions: Fall Precaution Comments: watch BP Restrictions RLE Weight Bearing: Weight bearing as tolerated      Mobility  Bed Mobility Overal bed mobility: Needs Assistance Bed Mobility: Supine to Sit     Supine to sit: HOB elevated;Mod assist;+2 for safety/equipment     General bed mobility comments: assist for legs off bed and to scoot hips, pt able to lift trunk  Transfers Overall transfer level: Needs assistance   Transfers: Sit to/from Stand;Stand Pivot Transfers Sit to Stand: Min assist;+2 safety/equipment Stand pivot transfers: Total assist       General transfer comment: stood to stedy with A for safety and balance, then stood during transition with assist of lift equipment to recliner  Ambulation/Gait                Stairs            Wheelchair Mobility    Modified Rankin (Stroke Patients Only)        Balance Overall balance assessment: Needs assistance Sitting-balance support: Feet unsupported Sitting balance-Leahy Scale: Poor Sitting balance - Comments: difficulty getting feet to the floor, reliant on UE support   Standing balance support: Bilateral upper extremity supported Standing balance-Leahy Scale: Poor Standing balance comment: min A for balance with UE support                             Pertinent Vitals/Pain Pain Assessment: Faces Faces Pain Scale: Hurts even more Pain Location: R LE with dependency Pain Descriptors / Indicators: Grimacing;Operative site guarding Pain Intervention(s): Monitored during session;Repositioned;Ice applied    Home Living Family/patient expects to be discharged to:: Skilled nursing facility                 Additional Comments: pt resident at SNF     Prior Function Level of Independence: Needs assistance   Gait / Transfers Assistance Needed: reports laying in bed most of the time, pt poor historian           Hand Dominance        Extremity/Trunk Assessment   Upper Extremity Assessment Upper Extremity Assessment: LUE deficits/detail;RUE deficits/detail RUE Deficits / Details: limited shoulder flexion AAROM to about 70 and unable to hold against gravity, elbow strength 4-/5 RUE Sensation: decreased light touch(tingling in fingers) LUE Deficits / Details: shoulder flexion AROM to about 80 with strength 3-/5, elbow  strength 4/5 LUE Sensation: decreased light touch(tingling in fingers)    Lower Extremity Assessment Lower Extremity Assessment: RLE deficits/detail;LLE deficits/detail RLE Deficits / Details: AAROM flexion knee about 35 limited by pain, strength quads 2-/5 LLE Deficits / Details: AROM WFL, strength knee extension 4-/5, hip flexion 3-/5    Cervical / Trunk Assessment Cervical / Trunk Assessment: Kyphotic  Communication   Communication: HOH  Cognition Arousal/Alertness: Awake/alert Behavior  During Therapy: WFL for tasks assessed/performed Overall Cognitive Status: No family/caregiver present to determine baseline cognitive functioning                                 General Comments: history of dementia      General Comments General comments (skin integrity, edema, etc.): R leg wrapped in ace bandage, bilateral heel pads on    Exercises General Exercises - Lower Extremity Ankle Circles/Pumps: AROM;5 reps;Supine Heel Slides: AROM;AAROM;5 reps;Supine;Both   Assessment/Plan    PT Assessment Patient needs continued PT services  PT Problem List Decreased strength;Decreased activity tolerance;Decreased mobility;Decreased safety awareness;Pain;Decreased knowledge of use of DME;Decreased balance;Decreased range of motion       PT Treatment Interventions DME instruction;Therapeutic activities;Balance training;Therapeutic exercise;Functional mobility training;Gait training;Patient/family education    PT Goals (Current goals can be found in the Care Plan section)  Acute Rehab PT Goals Patient Stated Goal: agreeable to return to SNF PT Goal Formulation: With patient Time For Goal Achievement: 09/05/19 Potential to Achieve Goals: Fair    Frequency Min 3X/week   Barriers to discharge        Co-evaluation               AM-PAC PT "6 Clicks" Mobility  Outcome Measure Help needed turning from your back to your side while in a flat bed without using bedrails?: A Lot Help needed moving from lying on your back to sitting on the side of a flat bed without using bedrails?: A Lot Help needed moving to and from a bed to a chair (including a wheelchair)?: Total Help needed standing up from a chair using your arms (e.g., wheelchair or bedside chair)?: A Lot Help needed to walk in hospital room?: Total Help needed climbing 3-5 steps with a railing? : Total 6 Click Score: 9    End of Session Equipment Utilized During Treatment: Gait belt Activity Tolerance:  Patient tolerated treatment well Patient left: in chair;with call bell/phone within reach;with chair alarm set Nurse Communication: Mobility status PT Visit Diagnosis: Muscle weakness (generalized) (M62.81);Difficulty in walking, not elsewhere classified (R26.2);Pain Pain - Right/Left: Right Pain - part of body: Knee    Time: 1610-9604 PT Time Calculation (min) (ACUTE ONLY): 31 min   Charges:   PT Evaluation $PT Eval Moderate Complexity: 1 Mod PT Treatments $Therapeutic Activity: 8-22 mins        Magda Kiel, [place] [facility] [phone] 08/22/2019   Reginia Naas 08/22/2019, 1:31 PM

## 2019-08-22 NOTE — Progress Notes (Signed)
Subjective: 1 Day Post-Op Procedure(s) (LRB): IRRIGATION AND DEBRIDEMENT EXTREMITY (Right) Patient reports pain as mild.   Patient seen in rounds by Dr. Charlann Boxer. Patient is up and eating breakfast upon exam this morning. She is pleasantly demented, and reports no concerns.  We will start therapy today.   Objective: Vital signs in last 24 hours: Temp:  [97 F (36.1 C)-97.7 F (36.5 C)] 97.5 F (36.4 C) (04/28 0700) Pulse Rate:  [53-73] 57 (04/28 0800) Resp:  [8-29] 9 (04/28 0800) BP: (90-150)/(39-81) 97/43 (04/28 0800) SpO2:  [98 %-100 %] 99 % (04/28 0800) Weight:  [58.4 kg] 58.4 kg (04/28 0600)  Intake/Output from previous day:  Intake/Output Summary (Last 24 hours) at 08/22/2019 0838 Last data filed at 08/22/2019 0345 Gross per 24 hour  Intake 2483.84 ml  Output 1225 ml  Net 1258.84 ml     Intake/Output this shift: No intake/output data recorded.  Labs: Recent Labs    08/19/19 2107 08/20/19 0332 08/20/19 0823 08/21/19 0811 08/22/19 0330  HGB 10.4* 10.6* 10.2* 11.2* 11.3*   Recent Labs    08/21/19 0811 08/22/19 0330  WBC 8.1 9.3  RBC 3.92 3.88  HCT 34.5* 33.4*  PLT 150 147*   Recent Labs    08/21/19 1851 08/22/19 0330  NA 137 139  K 3.0* 3.1*  CL 107 110  CO2 22 20*  BUN 23 19  CREATININE 1.36* 1.19*  GLUCOSE 119* 98  CALCIUM 7.9* 8.0*   Recent Labs    08/20/19 0935  INR 1.3*    Exam: General - Patient is Alert Extremity - Neurologically intact Sensation intact distally Intact pulses distally Dorsiflexion/Plantar flexion intact Dressing - dressing C/D/I Motor Function - intact, moving foot and toes well on exam.   Past Medical History:  Diagnosis Date  . Abdominal pain   . Atrophic vaginitis   . Cellulitis 06/2019  . CHF (congestive heart failure) (HCC)   . Colon polyps    Dr Ewing Schlein  . Diverticulosis    Dr Ewing Schlein  . Essential hypertension, benign   . Gastritis   . Hiatal hernia   . Insomnia, idiopathic   . Malaise and fatigue    . Osteoarthrosis and allied disorders   . Other and unspecified hyperlipidemia   . Thoracic scoliosis     Assessment/Plan: 1 Day Post-Op Procedure(s) (LRB): IRRIGATION AND DEBRIDEMENT EXTREMITY (Right) Principal Problem:   Non-healing surgical wound Active Problems:   Atrial fibrillation (HCC)   GI bleed   Enteritis   AKI (acute kidney injury) (HCC)   Hypotension   Duodenitis   Gastric ulcer without hemorrhage or perforation   Duodenal ulcer  Estimated body mass index is 20.78 kg/m as calculated from the following:   Height as of this encounter: 5\' 6"  (1.676 m).   Weight as of this encounter: 58.4 kg. Advance diet Up with therapy  DVT Prophylaxis - Foot Pumps and TED hose Weight bearing as tolerated RLE.   [name] is pleasantly demented, and appears to be doing well this morning. She has a very complicated situation. Dr. Tyler Deis is planning to have a family discussion this week to determine best treatment plan based on family's wishes.   Dressing to be changed tomorrow, and afterwards daily dressing changes with Xeroform, gauze, and paper tape.   We have ordered a PICC line, and she is currently on vancomycin, ceftriaxone, and flagyl. Infectious disease has been consulted, and we appreciate their recommendations and assistance in caring for her.   We also  appreciate the assistance of the medical team in management of this patient.   Griffith Citron, PA-C Orthopedic Surgery [phone] 08/22/2019, 8:38 AM

## 2019-08-22 NOTE — Progress Notes (Signed)
Peripherally Inserted Central Catheter Placement  The IV Nurse has discussed with the patient and/or persons authorized to consent for the patient, the purpose of this procedure and the potential benefits and risks involved with this procedure.  The benefits include less needle sticks, lab draws from the catheter, and the patient may be discharged home with the catheter. Risks include, but not limited to, infection, bleeding, blood clot (thrombus formation), and puncture of an artery; nerve damage and irregular heartbeat and possibility to perform a PICC exchange if needed/ordered by physician.  Alternatives to this procedure were also discussed.  Bard Power PICC patient education guide, fact sheet on infection prevention and patient information card has been provided to patient /or left at bedside.   Consent obtained via telephone with POA Carlynn Spry   PICC Placement Documentation  PICC Single Lumen 08/22/19 PICC Left Brachial 36 cm 0 cm (Active)  Indication for Insertion or Continuance of Line Prolonged intravenous therapies 08/22/19 1400  Exposed Catheter (cm) 0 cm 08/22/19 1400  Site Assessment Clean;Dry;Intact 08/22/19 1400  Line Status Flushed;Saline locked;Blood return noted 08/22/19 1400  Dressing Type Transparent;Securing device 08/22/19 1400  Dressing Status Clean;Dry;Intact;Antimicrobial disc in place 08/22/19 1400  Dressing Change Due 08/22/19 08/22/19 1400       Franne Grip [place] 08/22/2019, 2:26 PM

## 2019-08-23 DIAGNOSIS — T8459XA Infection and inflammatory reaction due to other internal joint prosthesis, initial encounter: Secondary | ICD-10-CM | POA: Diagnosis not present

## 2019-08-23 DIAGNOSIS — F0390 Unspecified dementia without behavioral disturbance: Secondary | ICD-10-CM | POA: Diagnosis not present

## 2019-08-23 LAB — CBC
HCT: 31.5 % — ABNORMAL LOW (ref 36.0–46.0)
Hemoglobin: 10.5 g/dL — ABNORMAL LOW (ref 12.0–15.0)
MCH: 29.2 pg (ref 26.0–34.0)
MCHC: 33.3 g/dL (ref 30.0–36.0)
MCV: 87.5 fL (ref 80.0–100.0)
Platelets: 133 10*3/uL — ABNORMAL LOW (ref 150–400)
RBC: 3.6 MIL/uL — ABNORMAL LOW (ref 3.87–5.11)
RDW: 17 % — ABNORMAL HIGH (ref 11.5–15.5)
WBC: 7.3 10*3/uL (ref 4.0–10.5)
nRBC: 0 % (ref 0.0–0.2)

## 2019-08-23 LAB — CULTURE, BLOOD (ROUTINE X 2)
Culture: NO GROWTH
Culture: NO GROWTH

## 2019-08-23 LAB — C-REACTIVE PROTEIN: CRP: 5.6 mg/dL — ABNORMAL HIGH (ref <1.0)

## 2019-08-23 LAB — BASIC METABOLIC PANEL
Anion gap: 6 (ref 5–15)
BUN: 20 mg/dL (ref 8–23)
CO2: 22 mmol/L (ref 22–32)
Calcium: 8.3 mg/dL — ABNORMAL LOW (ref 8.9–10.3)
Chloride: 109 mmol/L (ref 98–111)
Creatinine, Ser: 1.35 mg/dL — ABNORMAL HIGH (ref 0.44–1.00)
GFR calc Af Amer: 39 mL/min — ABNORMAL LOW (ref >60)
GFR calc non Af Amer: 34 mL/min — ABNORMAL LOW (ref >60)
Glucose, Bld: 143 mg/dL — ABNORMAL HIGH (ref 70–99)
Potassium: 3.8 mmol/L (ref 3.5–5.1)
Sodium: 137 mmol/L (ref 135–145)

## 2019-08-23 LAB — SEDIMENTATION RATE: Sed Rate: 3 mm/hr (ref 0–22)

## 2019-08-23 LAB — SARS CORONAVIRUS 2 (TAT 6-24 HRS): SARS Coronavirus 2: NEGATIVE

## 2019-08-23 MED ORDER — PANTOPRAZOLE SODIUM 40 MG PO TBEC
40.0000 mg | DELAYED_RELEASE_TABLET | Freq: Two times a day (BID) | ORAL | Status: DC
  Administered 2019-08-23 – 2019-08-24 (×2): 40 mg via ORAL
  Filled 2019-08-23 (×2): qty 1

## 2019-08-23 NOTE — Care Management Important Message (Signed)
Important Message  Patient Details  Name: [name] MRN: [id number] Date of Birth: 03/31/1925   Medicare Important Message Given:  Yes     Renie Ora 08/23/2019, 10:46 AM

## 2019-08-23 NOTE — TOC Initial Note (Addendum)
Transition of Care [facility]) - Initial/Assessment Note    Patient Details  Name: [name] MRN: [id number] Date of Birth: [birth year]  Transition of Care [facility]) CM/SW Contact:    Eduard Roux, LCSWA Phone Number: 08/23/2019, 12:22 PM  Clinical Narrative:                  CSW spoke with the patient's niece,Linda. Bonita Quin states she is the patient's POA and her daughter,Andrea is HCPOA. Patient niece confirmed the patient is from [facility] and is agreeable to the patient returning when medically stable. Family reports no questions or concerns at this time.   CSW started insurance authorization process. Covid test requested.  CSW will continue to follow and assist with discharge planning.  Antony Blackbird, MSW, LCSWA Clinical Social Worker   Expected Discharge Plan: Skilled Nursing Facility Barriers to Discharge: English as a second language teacher, Other (comment)(covid test results)   Patient Goals and CMS Choice        Expected Discharge Plan and Services Expected Discharge Plan: Skilled Nursing Facility In-house Referral: Clinical Social Work     Living arrangements for the past 2 months: Skilled Nursing Facility                                      Prior Living Arrangements/Services Living arrangements for the past 2 months: Skilled Nursing Facility   Patient language and need for interpreter reviewed:: No        Need for Family Participation in Patient Care: Yes (Comment) Care giver support system in place?: Yes (comment)   Criminal Activity/Legal Involvement Pertinent to Current Situation/Hospitalization: No - Comment as needed  Activities of Daily Living Home Assistive Devices/Equipment: Brace (specify type), Grab bars around toilet, Grab bars in shower, Shower chair with back, Environmental consultant (specify type) ADL Screening (condition at time of admission) Patient's cognitive ability adequate to safely complete daily activities?: No Is the patient deaf  or have difficulty hearing?: No Does the patient have difficulty seeing, even when wearing glasses/contacts?: No Does the patient have difficulty concentrating, remembering, or making decisions?: Yes Patient able to express need for assistance with ADLs?: Yes Does the patient have difficulty dressing or bathing?: Yes Independently performs ADLs?: No Communication: Independent Dressing (OT): Needs assistance Is this a change from baseline?: Pre-admission baseline Grooming: Needs assistance Is this a change from baseline?: Pre-admission baseline Feeding: Needs assistance Is this a change from baseline?: Pre-admission baseline Bathing: Needs assistance Is this a change from baseline?: Pre-admission baseline Toileting: Needs assistance Is this a change from baseline?: Pre-admission baseline In/Out Bed: Needs assistance Is this a change from baseline?: Pre-admission baseline Walks in Home: Needs assistance Is this a change from baseline?: Pre-admission baseline Does the patient have difficulty walking or climbing stairs?: Yes Weakness of Legs: Both Weakness of Arms/Hands: Both  Permission Sought/Granted Permission sought to share information with : Family Supports Permission granted to share information with : Yes, Verbal Permission Granted  Share Information with NAME: Gean Maidens [phone]     Permission granted to share info w Relationship: Neice  Permission granted to share info w Contact Information: [phone]  Emotional Assessment   Attitude/Demeanor/Rapport: Unable to Assess Affect (typically observed): Unable to Assess Orientation: : Oriented to Self Alcohol / Substance Use: Not Applicable Psych Involvement: No (comment)  Admission diagnosis:  GI bleed [K92.2] Upper GI bleed [K92.2] AKI (acute kidney injury) (HCC) [N17.9] Hypotension [I95.9] Duodenitis [  K29.80] Severe sepsis ([place]) [A41.9, R65.20] Patient Active Problem List   Diagnosis Date Noted  . Infected  prosthetic knee joint ([place]) 08/22/2019  . Non-healing surgical wound 08/21/2019  . Duodenitis   . Gastric ulcer without hemorrhage or perforation   . Duodenal ulcer   . Hypotension 08/19/2019  . GI bleed 08/18/2019  . Enteritis 08/18/2019  . AKI (acute kidney injury) ([place]) 08/18/2019  . Cellulitis of right lower leg 07/23/2019  . Chronic kidney disease (CKD), stage III (moderate) 12/18/2018  . Aortic atherosclerosis ([place]) 12/19/2017  . Bronchiectasis without complication ([place]) 35/46/5681  . Hypothyroidism 03/31/2015  . CHF (congestive heart failure) ([place]) 11/15/2014  . Absolute anemia 10/18/2014  . Bruising, spontaneous 09/16/2014  . Long term current use of anticoagulant 09/16/2014  . Atrial fibrillation ([place]) 04/15/2014  . Edema 04/15/2014  . Nodule of right lung 05/27/2013  . Memory impairment 03/12/2013  . Prophylactic immunotherapy 10/24/2012  . Dyspnea 07/06/2012  . Extrinsic asthma 08/31/2010  . Atrophic vaginitis   . Malaise and fatigue   . Thoracic scoliosis   . Insomnia, idiopathic   . Gastritis   . Gastroesophageal reflux disease with hiatal hernia   . Colon polyps   . Diverticulosis   . Osteoarthrosis and allied disorders   . Hyperlipidemia 02/11/2009  . Essential hypertension 02/10/2009   PCP:  Janora Norlander, DO Pharmacy:   [facility] [place] [address] Phone: [phone] Fax: [phone]     Social Determinants of Health (SDOH) Interventions    Readmission Risk Interventions No flowsheet data found.

## 2019-08-23 NOTE — NC FL2 (Signed)
[place] MEDICAID FL2 LEVEL OF CARE SCREENING TOOL     IDENTIFICATION  Patient Name: [name] Birthdate: 1924/05/06 Sex: female Admission Date (Current Location): 08/18/2019  [facility] and [place] Number:  Producer, television/film/video and Address:  [facility] [place], [address], [place], [place] [address]      Provider Number: [id number]  Attending Physician Name and Address:  Leatha Gilding, MD  Relative Name and Phone Number:       Current Level of Care: Hospital Recommended Level of Care: Skilled Nursing Facility Prior Approval Number:    Date Approved/Denied:   PASRR Number: [id number]  Discharge Plan: SNF    Current Diagnoses: Patient Active Problem List   Diagnosis Date Noted  . Infected prosthetic knee joint (HCC) 08/22/2019  . Non-healing surgical wound 08/21/2019  . Duodenitis   . Gastric ulcer without hemorrhage or perforation   . Duodenal ulcer   . Hypotension 08/19/2019  . GI bleed 08/18/2019  . Enteritis 08/18/2019  . AKI (acute kidney injury) (HCC) 08/18/2019  . Cellulitis of right lower leg 07/23/2019  . Chronic kidney disease (CKD), stage III (moderate) 12/18/2018  . Aortic atherosclerosis (HCC) 12/19/2017  . Bronchiectasis without complication (HCC) 12/19/2017  . Hypothyroidism 03/31/2015  . CHF (congestive heart failure) (HCC) 11/15/2014  . Absolute anemia 10/18/2014  . Bruising, spontaneous 09/16/2014  . Long term current use of anticoagulant 09/16/2014  . Atrial fibrillation (HCC) 04/15/2014  . Edema 04/15/2014  . Nodule of right lung 05/27/2013  . Memory impairment 03/12/2013  . Prophylactic immunotherapy 10/24/2012  . Dyspnea 07/06/2012  . Extrinsic asthma 08/31/2010  . Atrophic vaginitis   . Malaise and fatigue   . Thoracic scoliosis   . Insomnia, idiopathic   . Gastritis   . Gastroesophageal reflux disease with hiatal hernia   . Colon polyps   . Diverticulosis   . Osteoarthrosis and allied disorders    . Hyperlipidemia 02/11/2009  . Essential hypertension 02/10/2009    Orientation RESPIRATION BLADDER Height & Weight     Self  Normal Incontinent, External catheter Weight: 128 lb 12 oz (58.4 kg) Height:  5\' 6"  (167.6 cm)  BEHAVIORAL SYMPTOMS/MOOD NEUROLOGICAL BOWEL NUTRITION STATUS      Incontinent Diet(please see discharge summary)  AMBULATORY STATUS COMMUNICATION OF NEEDS Skin     Verbally Surgical wounds(Buttocks Stage II, pressure injury buttoks II, closed incision right leg, laceration head right anterior)                       Personal Care Assistance Level of Assistance  Bathing, Feeding, Dressing Bathing Assistance: Limited assistance Feeding assistance: Independent Dressing Assistance: Limited assistance     Functional Limitations Info  Sight, Hearing, Speech Sight Info: Adequate Hearing Info: Adequate Speech Info: Adequate    SPECIAL CARE FACTORS FREQUENCY  PT (By licensed PT)     PT Frequency: 3x per week OT Frequency: 3x per week            Contractures Contractures Info: Not present    Additional Factors Info  Code Status, Allergies Code Status Info: FULL Allergies Info: Simvastatin,Ace Inhibitors           Current Medications (08/23/2019):  This is the current hospital active medication list Current Facility-Administered Medications  Medication Dose Route Frequency Provider Last Rate Last Admin  . 0.9 %  sodium chloride infusion  250 mL Intravenous Continuous 08/25/2019, PA-C   Stopped at 08/19/19 0245  .  0.9 %  sodium chloride infusion   Intravenous Continuous Maurice March, PA-C   Stopped at 08/22/19 0345  . acetaminophen (TYLENOL) tablet 325-650 mg  325-650 mg Oral Q6H PRN Maurice March, PA-C      . acetaminophen (TYLENOL) tablet 650 mg  650 mg Oral Once Maurice March, PA-C      . albuterol (PROVENTIL) (2.5 MG/3ML) 0.083% nebulizer solution 2.5 mg  2.5 mg Inhalation Q6H PRN Maurice March, PA-C      . amiodarone  (PACERONE) tablet 100 mg  100 mg Oral Daily Griffith Citron R, PA-C   100 mg at 08/23/19 0847  . ARIPiprazole (ABILIFY) tablet 5 mg  5 mg Oral Daily Maurice March, PA-C   5 mg at 08/23/19 0848  . busPIRone (BUSPAR) tablet 5 mg  5 mg Oral QHS Maurice March, PA-C   5 mg at 08/22/19 2155  . cefTRIAXone (ROCEPHIN) 2 g in sodium chloride 0.9 % 100 mL IVPB  2 g Intravenous QHS Tommy Medal, Lavell Islam, MD 200 mL/hr at 08/22/19 2236 2 g at 08/22/19 2236  . Chlorhexidine Gluconate Cloth 2 % PADS 6 each  6 each Topical Daily Maurice March, PA-C   6 each at 08/23/19 4403  . diphenhydrAMINE (BENADRYL) 12.5 MG/5ML elixir 12.5-25 mg  12.5-25 mg Oral Q4H PRN Maurice March, PA-C      . docusate sodium (COLACE) capsule 100 mg  100 mg Oral BID PRN Maurice March, PA-C   100 mg at 08/21/19 2104  . docusate sodium (COLACE) capsule 100 mg  100 mg Oral BID Maurice March, PA-C   100 mg at 08/23/19 0846  . doxycycline (VIBRA-TABS) tablet 100 mg  100 mg Oral Q12H Tommy Medal, Lavell Islam, MD   100 mg at 08/23/19 0846  . Gerhardt's butt cream   Topical PRN Maurice March, PA-C   Given at 08/22/19 1031  . levothyroxine (SYNTHROID) tablet 50 mcg  50 mcg Oral QAC breakfast Maurice March, PA-C   50 mcg at 08/23/19 0846  . magic mouthwash  5 mL Oral TID Maurice March, PA-C   5 mL at 08/23/19 0846  . magnesium citrate solution 1 Bottle  1 Bottle Oral Once PRN Maurice March, PA-C      . memantine [facility]) tablet 5 mg  5 mg Oral Daily Maurice March, PA-C   5 mg at 08/23/19 0847  . menthol-cetylpyridinium (CEPACOL) lozenge 3 mg  1 lozenge Oral PRN Griffith Citron R, PA-C       Or  . phenol (CHLORASEPTIC) mouth spray 1 spray  1 spray Mouth/Throat PRN Maurice March, PA-C      . methocarbamol (ROBAXIN) tablet 500 mg  500 mg Oral Q6H PRN Maurice March, PA-C   500 mg at 08/21/19 2103   Or  . methocarbamol (ROBAXIN) 500 mg in dextrose 5 % 50 mL IVPB  500 mg Intravenous Q6H PRN Maurice March, PA-C      . mometasone-formoterol (DULERA) 200-5 MCG/ACT inhaler 2 puff  2 puff Inhalation BID Maurice March, PA-C   2 puff at 08/23/19 [phone]  . norepinephrine (LEVOPHED) 4mg  in 229mL premix infusion  2-10 mcg/min Intravenous Titrated Maurice March, PA-C   Stopped at 08/20/19 1128  . ondansetron (ZOFRAN) tablet 4 mg  4 mg Oral Q6H PRN Griffith Citron R, PA-C       Or  . ondansetron [facility]) injection 4 mg  4  mg Intravenous Q6H PRN Tommie Ard, PA-C   4 mg at 08/21/19 2213  . pantoprazole (PROTONIX) injection 40 mg  40 mg Intravenous Q12H Tommie Ard, PA-C   40 mg at 08/23/19 0847  . polyethylene glycol (MIRALAX / GLYCOLAX) packet 17 g  17 g Oral Daily PRN Dennie Bible R, PA-C      . sodium chloride flush (NS) 0.9 % injection 10-40 mL  10-40 mL Intracatheter Q12H Leatha Gilding, MD   10 mL at 08/23/19 0851  . sodium chloride flush (NS) 0.9 % injection 10-40 mL  10-40 mL Intracatheter PRN Leatha Gilding, MD      . traMADol Janean Sark) tablet 50 mg  50 mg Oral Q6H PRN Tommie Ard, PA-C   50 mg at 08/22/19 0347  . white petrolatum (VASELINE) gel 1 application  1 application Topical PRN Tommie Ard, PA-C   1 application at 08/22/19 6734     Discharge Medications: Please see discharge summary for a list of discharge medications.  Relevant Imaging Results:  Relevant Lab Results:   Additional Information SSN [id number]  Eduard Roux, LCSWA

## 2019-08-23 NOTE — Progress Notes (Signed)
PHARMACY CONSULT NOTE FOR:  OUTPATIENT  PARENTERAL ANTIBIOTIC THERAPY (OPAT)  Indication: culture negative prosthetic knee infection Regimen: ceftriaxone IV 2 g daily, doxycycline PO 100 mg bid End date: 10/02/2019  IV antibiotic discharge orders are pended. To discharging provider:  please sign these orders via discharge navigator,  Select New Orders & click on the button choice - Manage This Unsigned Work.     Thank you for allowing pharmacy to be a part of this patient's care.  Noel Journey  Student-PharmD 08/23/2019, 3:09 PM

## 2019-08-23 NOTE — Progress Notes (Signed)
PROGRESS NOTE  [name] WEX:[id number] DOB: [birth year] DOA: 08/18/2019 PCP: Janora Norlander, DO   LOS: 5 days   Brief Narrative / Interim history: 86 year old female with CHF (unknown type), A. fib not on AC, asthma, bronchiectasis, chronic kidney disease stage III, hypothyroidism, HTN, HLD, mild dementia, recent ORIF 06/26/2019 for periprosthetic femur fracture who presents with abdominal pain with melena as well as several episodes of bright red blood per rectum.  She lives in [place] home.  On admission she was hypotensive and admitted to the ICU.  CT scan showed small bowel enteritis.  She was initially admitted to the hospitalist however due to persistent hypotension she was sent to the ICU for pressors.  Hospital course complicated by wound dehiscence at the right prosthetic knee and concern for sepsis, she was taken to the OR on 4/27 by orthopedic surgery.  ID also consulted.  She is transferred back to the hospitalist service on 4/28.  Subjective / 24h Interval events: No complaints, feeling overall well.  Slept well overnight.  No chest pain, shortness of breath.  Assessment & Plan: Principal Problem Hypovolemic shock due to GI bleed-gastroenterology was consulted and patient underwent an EGD which showed duodenal ulcers.  She was placed on PPI, continue.  CBC shows stable hemoglobin, 10.5 this morning, clinically without evidence of ongoing bleeding.  Active Problems Septic right prosthetic knee/wound dehiscence-recent ORIF for periprosthetic femur fracture at [facility]  Orthopedic surgery consulted, she underwent washout in the OR on 4/27 which confirmed septic arthritis.  Infectious disease consulted and recommended ceftriaxone for [redacted] weeks along with oral doxycycline, followed by indefinite chronic oral suppressive therapy.  PT worked with patient and recommended SNF, patient agreeable, consulted social worker today  Acute kidney injury on chronic kidney disease  stage IIIa-creatinine continues to improve, 1.19  CHF/PAF/HTN/HLD-continue amiodarone, in sinus  History of asthma-continue inhalers, no wheezing  Hypothyroidism-continue Synthroid   Scheduled Meds: . acetaminophen  650 mg Oral Once  . amiodarone  100 mg Oral Daily  . ARIPiprazole  5 mg Oral Daily  . busPIRone  5 mg Oral QHS  . Chlorhexidine Gluconate Cloth  6 each Topical Daily  . docusate sodium  100 mg Oral BID  . doxycycline  100 mg Oral Q12H  . levothyroxine  50 mcg Oral QAC breakfast  . magic mouthwash  5 mL Oral TID  . memantine  5 mg Oral Daily  . mometasone-formoterol  2 puff Inhalation BID  . pantoprazole (PROTONIX) IV  40 mg Intravenous Q12H  . sodium chloride flush  10-40 mL Intracatheter Q12H   Continuous Infusions: . sodium chloride Stopped (08/19/19 0245)  . sodium chloride Stopped (08/22/19 0345)  . cefTRIAXone (ROCEPHIN)  IV 2 g (08/22/19 2236)  . methocarbamol (ROBAXIN) IV    . norepinephrine (LEVOPHED) Adult infusion Stopped (08/20/19 1128)   PRN Meds:.acetaminophen, albuterol, diphenhydrAMINE, docusate sodium, Gerhardt's butt cream, magnesium citrate, menthol-cetylpyridinium **OR** phenol, methocarbamol **OR** methocarbamol (ROBAXIN) IV, ondansetron **OR** ondansetron (ZOFRAN) IV, polyethylene glycol, sodium chloride flush, traMADol, white petrolatum  DVT prophylaxis: SCDs Code Status: Full Code Family Communication: no family at bedside  Patient admitted from: SNF Anticipated d/c place: SNF Barriers to d/c: I anticipate SNF discharge likely within 24 hours as SNF search has been started today.  Covid pending  Consultants:  ID, orthopedic surgery, PCCM  Procedures:  OR I&D right knee 4/27 EGD  Microbiology  None   Antimicrobials: Ceftriaxone / Doxycycline 4/28 >>    Objective: Vitals:   08/23/19  0145 08/23/19 0427 08/23/19 0821 08/23/19 0834  BP:  (!) 107/59 114/64   Pulse:  64 70 94  Resp:  18 16 17   Temp:  98 F (36.7 C) 98.1 F (36.7  C)   TempSrc:  Tympanic Oral   SpO2: 99% 97% 97% 100%  Weight:      Height:        Intake/Output Summary (Last 24 hours) at 08/23/2019 1034 Last data filed at 08/23/2019 0900 Gross per 24 hour  Intake 372 ml  Output 500 ml  Net -128 ml   Filed Weights   08/20/19 1345 08/21/19 0500 08/22/19 0600  Weight: 57.7 kg 57.8 kg 58.4 kg    Examination:  Constitutional: No distress, in bed Eyes: No scleral icterus ENMT: Moist mucous membranes Neck: normal, supple Respiratory: Clear bilaterally without wheezing or crackles, normal respiratory effort Cardiovascular: Regular rate and rhythm, no edema Abdomen: Soft, nontender, nondistended, bowel sounds positive Musculoskeletal: no clubbing / cyanosis.  Skin: No rashes appreciated Neurologic: No focal deficits  Data Reviewed: I have independently reviewed following labs and imaging studies   CBC: Recent Labs  Lab 08/18/19 1645 08/19/19 0154 08/20/19 0332 08/20/19 0823 08/21/19 0811 08/22/19 0330 08/23/19 0500  WBC 13.0*   < > 13.0* 10.2 8.1 9.3 7.3  NEUTROABS 10.8*  --   --   --   --   --   --   HGB 11.1*   < > 10.6* 10.2* 11.2* 11.3* 10.5*  HCT 34.2*   < > 32.5* 31.8* 34.5* 33.4* 31.5*  MCV 88.4   < > 86.4 88.1 88.0 86.1 87.5  PLT 227   < > 252 193 150 147* 133*   < > = values in this interval not displayed.   Basic Metabolic Panel: Recent Labs  Lab 08/19/19 0745 08/20/19 0332 08/20/19 1854 08/21/19 1851 08/22/19 0330  NA 142 143 141 137 139  K 3.8 3.7 3.1* 3.0* 3.1*  CL 119* 118* 114* 107 110  CO2 15* 17* 17* 22 20*  GLUCOSE 79 90 103* 119* 98  BUN 76* 51* 36* 23 19  CREATININE 1.94* 1.65* 1.53* 1.36* 1.19*  CALCIUM 7.8* 8.3* 8.0* 7.9* 8.0*  MG  --  1.4*  --   --   --   PHOS  --  3.1  --   --   --    Liver Function Tests: Recent Labs  Lab 08/18/19 1645  AST 31  ALT 25  ALKPHOS 88  BILITOT 0.7  PROT 4.5*  ALBUMIN 2.3*   Coagulation Profile: Recent Labs  Lab 08/20/19 0935  INR 1.3*   HbA1C: No  results for input(s): HGBA1C in the last 72 hours. CBG: Recent Labs  Lab 08/21/19 0811  GLUCAP 107*    Recent Results (from the past 240 hour(s))  Urine culture     Status: Abnormal   Collection Time: 08/18/19  6:57 PM   Specimen: Urine, Random  Result Value Ref Range Status   Specimen Description URINE, RANDOM  Final   Special Requests NONE  Final   Culture (A)  Final    40,000 COLONIES/mL DIPHTHEROIDS(CORYNEBACTERIUM SPECIES) Standardized susceptibility testing for this organism is not available. Performed at [facility], [address], [place], [place]    Report Status 08/19/2019 FINAL  Final  Blood culture (routine x 2)     Status: None   Collection Time: 08/18/19  7:04 PM   Specimen: BLOOD RIGHT ARM  Result Value Ref Range Status  Specimen Description BLOOD RIGHT ARM  Final   Special Requests   Final    BOTTLES DRAWN AEROBIC AND ANAEROBIC Blood Culture results may not be optimal due to an inadequate volume of blood received in culture bottles   Culture   Final    NO GROWTH 5 DAYS Performed at [facility], [address] [place], [place] [address]    Report Status 08/23/2019 FINAL  Final  Respiratory Panel by RT PCR (Flu A&B, Covid) - Nasopharyngeal Swab     Status: None   Collection Time: 08/18/19 10:57 PM   Specimen: Nasopharyngeal Swab  Result Value Ref Range Status   SARS Coronavirus 2 by RT PCR NEGATIVE NEGATIVE Final    Comment: (NOTE) SARS-CoV-2 target nucleic acids are NOT DETECTED. The SARS-CoV-2 RNA is generally detectable in upper respiratoy specimens during the acute phase of infection. The lowest concentration of SARS-CoV-2 viral copies this assay can detect is 131 copies/mL. A negative result does not preclude SARS-Cov-2 infection and should not be used as the sole basis for treatment or other patient management decisions. A negative result may occur with  improper specimen collection/handling, submission of specimen  other than nasopharyngeal swab, presence of viral mutation(s) within the areas targeted by this assay, and inadequate number of viral copies (<131 copies/mL). A negative result must be combined with clinical observations, patient history, and epidemiological information. The expected result is Negative. Fact Sheet for Patients:  https://www.moore.com/ Fact Sheet for Healthcare Providers:  https://www.young.biz/ This test is not yet ap proved or cleared by the [place] FDA and  has been authorized for detection and/or diagnosis of SARS-CoV-2 by FDA under an Emergency Use Authorization (EUA). This EUA will remain  in effect (meaning this test can be used) for the duration of the COVID-19 declaration under Section 564(b)(1) of the Act, 21 U.S.C. section 360bbb-3(b)(1), unless the authorization is terminated or revoked sooner.    Influenza A by PCR NEGATIVE NEGATIVE Final   Influenza B by PCR NEGATIVE NEGATIVE Final    Comment: (NOTE) The Xpert Xpress SARS-CoV-2/FLU/RSV assay is intended as an aid in  the diagnosis of influenza from Nasopharyngeal swab specimens and  should not be used as a sole basis for treatment. Nasal washings and  aspirates are unacceptable for Xpert Xpress SARS-CoV-2/FLU/RSV  testing. Fact Sheet for Patients: https://www.moore.com/ Fact Sheet for Healthcare Providers: https://www.young.biz/ This test is not yet approved or cleared by the [place] FDA and  has been authorized for detection and/or diagnosis of SARS-CoV-2 by  FDA under an Emergency Use Authorization (EUA). This EUA will remain  in effect (meaning this test can be used) for the duration of the  Covid-19 declaration under Section 564(b)(1) of the Act, 21  U.S.C. section 360bbb-3(b)(1), unless the authorization is  terminated or revoked. Performed at [facility], [address], [place], [place] [address]    Blood culture (routine x 2)     Status: None   Collection Time: 08/18/19 11:08 PM   Specimen: BLOOD  Result Value Ref Range Status   Specimen Description BLOOD LEFT ANTECUBITAL  Final   Special Requests   Final    BOTTLES DRAWN AEROBIC AND ANAEROBIC Blood Culture results may not be optimal due to an excessive volume of blood received in culture bottles   Culture   Final    NO GROWTH 5 DAYS Performed at [facility], [address], [place], [place] [address]    Report Status 08/23/2019 FINAL  Final  MRSA PCR Screening     Status: None   Collection Time: 08/19/19  8:18 PM   Specimen: Nasal Mucosa; Nasopharyngeal  Result Value Ref Range Status   MRSA by PCR NEGATIVE NEGATIVE Final    Comment:        The GeneXpert MRSA Assay (FDA approved for NASAL specimens only), is one component of a comprehensive MRSA colonization surveillance program. It is not intended to diagnose MRSA infection nor to guide or monitor treatment for MRSA infections. Performed at [facility], [address] [place], [place] [address]   Gastrointestinal Panel by PCR , Stool     Status: None   Collection Time: 08/20/19  4:09 AM  Result Value Ref Range Status   Campylobacter species NOT DETECTED NOT DETECTED Final   Plesimonas shigelloides NOT DETECTED NOT DETECTED Final   Salmonella species NOT DETECTED NOT DETECTED Final   Yersinia enterocolitica NOT DETECTED NOT DETECTED Final   Vibrio species NOT DETECTED NOT DETECTED Final   Vibrio cholerae NOT DETECTED NOT DETECTED Final   Enteroaggregative E coli (EAEC) NOT DETECTED NOT DETECTED Final   Enteropathogenic E coli (EPEC) NOT DETECTED NOT DETECTED Final   Enterotoxigenic E coli (ETEC) NOT DETECTED NOT DETECTED Final   Shiga like toxin producing E coli (STEC) NOT DETECTED NOT DETECTED Final   Shigella/Enteroinvasive E coli (EIEC) NOT DETECTED NOT DETECTED Final   Cryptosporidium NOT DETECTED NOT DETECTED Final   Cyclospora cayetanensis NOT  DETECTED NOT DETECTED Final   Entamoeba histolytica NOT DETECTED NOT DETECTED Final   Giardia lamblia NOT DETECTED NOT DETECTED Final   Adenovirus F40/41 NOT DETECTED NOT DETECTED Final   Astrovirus NOT DETECTED NOT DETECTED Final   Norovirus GI/GII NOT DETECTED NOT DETECTED Final   Rotavirus A NOT DETECTED NOT DETECTED Final   Sapovirus (I, II, IV, and V) NOT DETECTED NOT DETECTED Final    Comment: Performed at [facility], [address] [place], [place] [address]  Aerobic/Anaerobic Culture (surgical/deep wound)     Status: None (Preliminary result)   Collection Time: 08/20/19 11:00 AM   Specimen: Wound  Result Value Ref Range Status   Specimen Description WOUND  Final   Special Requests NONE  Final   Gram Stain   Final    NO WBC SEEN NO ORGANISMS SEEN Performed at [facility], [address] [place], [place] [address]    Culture   Final    RARE NORMAL SKIN FLORA NO ANAEROBES ISOLATED; CULTURE IN PROGRESS FOR 5 DAYS    Report Status PENDING  Incomplete  Surgical PCR screen     Status: None   Collection Time: 08/20/19 11:32 AM   Specimen: Nasal Mucosa; Nasal Swab  Result Value Ref Range Status   MRSA, PCR NEGATIVE NEGATIVE Final   Staphylococcus aureus NEGATIVE NEGATIVE Final    Comment: (NOTE) The Xpert SA Assay (FDA approved for NASAL specimens in patients 84 years of age and older), is one component of a comprehensive surveillance program. It is not intended to diagnose infection nor to guide or monitor treatment. Performed at [facility], [address], [place], [place] [address]      Radiology Studies: No results found. Pamella Pert, MD, PhD [facility]  Between 7 am - 7 pm I am available, please contact me via Amion or Securechat  Between 7 pm - 7 am I am not available, please contact night coverage MD/APP via Amion

## 2019-08-23 NOTE — Progress Notes (Addendum)
Subjective: No new complaints   Antibiotics:  Anti-infectives (From admission, onward)   Start     Dose/Rate Route Frequency Ordered Stop   08/23/19 1730  vancomycin (VANCOREADY) IVPB 750 mg/150 mL  Status:  Discontinued     750 mg 150 mL/hr over 60 Minutes Intravenous Every 48 hours 08/21/19 1640 08/22/19 0945   08/22/19 2200  cefTRIAXone (ROCEPHIN) 2 g in sodium chloride 0.9 % 100 mL IVPB     2 g 200 mL/hr over 30 Minutes Intravenous Daily at bedtime 08/22/19 0955 10/02/19 2359   08/22/19 1000  doxycycline (VIBRA-TABS) tablet 100 mg     100 mg Oral Every 12 hours 08/22/19 0945     08/21/19 1645  vancomycin (VANCOCIN) IVPB 1000 mg/200 mL premix     1,000 mg 200 mL/hr over 60 Minutes Intravenous  Once 08/21/19 1638 08/21/19 2152   08/21/19 1045  ceFAZolin (ANCEF) IVPB 2g/100 mL premix  Status:  Discontinued     2 g 200 mL/hr over 30 Minutes Intravenous On call to O.R. 08/21/19 1040 08/21/19 1720   08/20/19 1100  doxycycline (VIBRAMYCIN) 100 mg in sodium chloride 0.9 % 250 mL IVPB  Status:  Discontinued     100 mg 125 mL/hr over 120 Minutes Intravenous Every 12 hours 08/20/19 1052 08/21/19 1640   08/20/19 1045  doxycycline (VIBRA-TABS) tablet 100 mg  Status:  Discontinued     100 mg Oral Every 12 hours 08/20/19 1035 08/20/19 1052   08/19/19 0200  metroNIDAZOLE (FLAGYL) IVPB 500 mg  Status:  Discontinued     500 mg 100 mL/hr over 60 Minutes Intravenous Every 8 hours 08/18/19 2245 08/22/19 0945   08/18/19 2300  cefTRIAXone (ROCEPHIN) 2 g in sodium chloride 0.9 % 100 mL IVPB  Status:  Discontinued     2 g 200 mL/hr over 30 Minutes Intravenous Daily at bedtime 08/18/19 2245 08/22/19 0955   08/18/19 1830  ciprofloxacin (CIPRO) IVPB 400 mg     400 mg 200 mL/hr over 60 Minutes Intravenous  Once 08/18/19 1829 08/18/19 2123   08/18/19 1830  metroNIDAZOLE (FLAGYL) IVPB 500 mg     500 mg 100 mL/hr over 60 Minutes Intravenous  Once 08/18/19 1829 08/18/19 2123       Medications: Scheduled Meds: . acetaminophen  650 mg Oral Once  . amiodarone  100 mg Oral Daily  . ARIPiprazole  5 mg Oral Daily  . busPIRone  5 mg Oral QHS  . Chlorhexidine Gluconate Cloth  6 each Topical Daily  . docusate sodium  100 mg Oral BID  . doxycycline  100 mg Oral Q12H  . levothyroxine  50 mcg Oral QAC breakfast  . magic mouthwash  5 mL Oral TID  . memantine  5 mg Oral Daily  . mometasone-formoterol  2 puff Inhalation BID  . pantoprazole  40 mg Oral BID  . sodium chloride flush  10-40 mL Intracatheter Q12H   Continuous Infusions: . sodium chloride Stopped (08/19/19 0245)  . sodium chloride Stopped (08/22/19 0345)  . cefTRIAXone (ROCEPHIN)  IV 2 g (08/22/19 2236)  . methocarbamol (ROBAXIN) IV     PRN Meds:.acetaminophen, albuterol, diphenhydrAMINE, docusate sodium, Gerhardt's butt cream, magnesium citrate, menthol-cetylpyridinium **OR** phenol, methocarbamol **OR** methocarbamol (ROBAXIN) IV, ondansetron **OR** ondansetron (ZOFRAN) IV, polyethylene glycol, sodium chloride flush, traMADol, white petrolatum    Objective: Weight change:   Intake/Output Summary (Last 24 hours) at 08/23/2019 1538 Last data filed at 08/23/2019 1300 Gross per 24 hour  Intake 340 ml  Output 800 ml  Net -460 ml   Blood pressure (!) 97/56, pulse 77, temperature 98.1 F (36.7 C), temperature source Oral, resp. rate 17, height 5\' 6"  (1.676 m), weight 58.4 kg, SpO2 99 %. Temp:  [97.9 F (36.6 C)-98.2 F (36.8 C)] 98.1 F (36.7 C) (04/29 1459) Pulse Rate:  [58-94] 77 (04/29 1459) Resp:  [9-18] 17 (04/29 1459) BP: (95-114)/(55-64) 97/56 (04/29 1459) SpO2:  [97 %-100 %] 99 % (04/29 1459)  Physical Exam: General: Alert and awake, but confused HEENT: anicteric sclera, EOMI CVS regular rate, normal  Chest: , no wheezing, no respiratory distress Abdomen: soft non-distended,  Extremities: dressing in place Skin: no rashes Neuro: nonfocal  CBC:    BMET Recent Labs     08/22/19 0330 08/23/19 1000  NA 139 137  K 3.1* 3.8  CL 110 109  CO2 20* 22  GLUCOSE 98 143*  BUN 19 20  CREATININE 1.19* 1.35*  CALCIUM 8.0* 8.3*     Liver Panel  No results for input(s): PROT, ALBUMIN, AST, ALT, ALKPHOS, BILITOT, BILIDIR, IBILI in the last 72 hours.     Sedimentation Rate Recent Labs    08/23/19 0500  ESRSEDRATE 3   C-Reactive Protein Recent Labs    08/23/19 0500  CRP 5.6*    Micro Results: Recent Results (from the past 720 hour(s))  Urine culture     Status: Abnormal   Collection Time: 08/18/19  6:57 PM   Specimen: Urine, Random  Result Value Ref Range Status   Specimen Description URINE, RANDOM  Final   Special Requests NONE  Final   Culture (A)  Final    40,000 COLONIES/mL DIPHTHEROIDS(CORYNEBACTERIUM SPECIES) Standardized susceptibility testing for this organism is not available. Performed at [facility], [address] [place], [place]    Report Status 08/19/2019 FINAL  Final  Blood culture (routine x 2)     Status: None   Collection Time: 08/18/19  7:04 PM   Specimen: BLOOD RIGHT ARM  Result Value Ref Range Status   Specimen Description BLOOD RIGHT ARM  Final   Special Requests   Final    BOTTLES DRAWN AEROBIC AND ANAEROBIC Blood Culture results may not be optimal due to an inadequate volume of blood received in culture bottles   Culture   Final    NO GROWTH 5 DAYS Performed at [facility], [address], [place], [place]    Report Status 08/23/2019 FINAL  Final  Respiratory Panel by RT PCR (Flu A&B, Covid) - Nasopharyngeal Swab     Status: None   Collection Time: 08/18/19 10:57 PM   Specimen: Nasopharyngeal Swab  Result Value Ref Range Status   SARS Coronavirus 2 by RT PCR NEGATIVE NEGATIVE Final    Comment: (NOTE) SARS-CoV-2 target nucleic acids are NOT DETECTED. The SARS-CoV-2 RNA is generally detectable in upper respiratoy specimens during the acute phase of infection. The  lowest concentration of SARS-CoV-2 viral copies this assay can detect is 131 copies/mL. A negative result does not preclude SARS-Cov-2 infection and should not be used as the sole basis for treatment or other patient management decisions. A negative result may occur with  improper specimen collection/handling, submission of specimen other than nasopharyngeal swab, presence of viral mutation(s) within the areas targeted by this assay, and inadequate number of viral copies (<131 copies/mL). A negative result must be combined with clinical observations, patient history, and epidemiological information. The expected result is Negative.  Fact Sheet for Patients:  https://www.moore.com/ Fact Sheet for Healthcare Providers:  https://www.young.biz/ This test is not yet ap proved or cleared by the [place] FDA and  has been authorized for detection and/or diagnosis of SARS-CoV-2 by FDA under an Emergency Use Authorization (EUA). This EUA will remain  in effect (meaning this test can be used) for the duration of the COVID-19 declaration under Section 564(b)(1) of the Act, 21 U.S.C. section 360bbb-3(b)(1), unless the authorization is terminated or revoked sooner.    Influenza A by PCR NEGATIVE NEGATIVE Final   Influenza B by PCR NEGATIVE NEGATIVE Final    Comment: (NOTE) The Xpert Xpress SARS-CoV-2/FLU/RSV assay is intended as an aid in  the diagnosis of influenza from Nasopharyngeal swab specimens and  should not be used as a sole basis for treatment. Nasal washings and  aspirates are unacceptable for Xpert Xpress SARS-CoV-2/FLU/RSV  testing. Fact Sheet for Patients: https://www.moore.com/ Fact Sheet for Healthcare Providers: https://www.young.biz/ This test is not yet approved or cleared by the [place] FDA and  has been authorized for detection and/or diagnosis of SARS-CoV-2 by  FDA under an Emergency  Use Authorization (EUA). This EUA will remain  in effect (meaning this test can be used) for the duration of the  Covid-19 declaration under Section 564(b)(1) of the Act, 21  U.S.C. section 360bbb-3(b)(1), unless the authorization is  terminated or revoked. Performed at [facility], [address], [place], [place] [address]   Blood culture (routine x 2)     Status: None   Collection Time: 08/18/19 11:08 PM   Specimen: BLOOD  Result Value Ref Range Status   Specimen Description BLOOD LEFT ANTECUBITAL  Final   Special Requests   Final    BOTTLES DRAWN AEROBIC AND ANAEROBIC Blood Culture results may not be optimal due to an excessive volume of blood received in culture bottles   Culture   Final    NO GROWTH 5 DAYS Performed at [facility], [address] [place], [place] [address]    Report Status 08/23/2019 FINAL  Final  MRSA PCR Screening     Status: None   Collection Time: 08/19/19  8:18 PM   Specimen: Nasal Mucosa; Nasopharyngeal  Result Value Ref Range Status   MRSA by PCR NEGATIVE NEGATIVE Final    Comment:        The GeneXpert MRSA Assay (FDA approved for NASAL specimens only), is one component of a comprehensive MRSA colonization surveillance program. It is not intended to diagnose MRSA infection nor to guide or monitor treatment for MRSA infections. Performed at [facility], [address], [place], [place] [address]   Gastrointestinal Panel by PCR , Stool     Status: None   Collection Time: 08/20/19  4:09 AM  Result Value Ref Range Status   Campylobacter species NOT DETECTED NOT DETECTED Final   Plesimonas shigelloides NOT DETECTED NOT DETECTED Final   Salmonella species NOT DETECTED NOT DETECTED Final   Yersinia enterocolitica NOT DETECTED NOT DETECTED Final   Vibrio species NOT DETECTED NOT DETECTED Final   Vibrio cholerae NOT DETECTED NOT DETECTED Final   Enteroaggregative E coli (EAEC) NOT DETECTED NOT DETECTED Final   Enteropathogenic  E coli (EPEC) NOT DETECTED NOT DETECTED Final   Enterotoxigenic E coli (ETEC) NOT DETECTED NOT DETECTED Final   Shiga like toxin producing E coli (STEC) NOT DETECTED NOT DETECTED Final   Shigella/Enteroinvasive E coli (EIEC) NOT DETECTED NOT DETECTED Final  Cryptosporidium NOT DETECTED NOT DETECTED Final   Cyclospora cayetanensis NOT DETECTED NOT DETECTED Final   Entamoeba histolytica NOT DETECTED NOT DETECTED Final   Giardia lamblia NOT DETECTED NOT DETECTED Final   Adenovirus F40/41 NOT DETECTED NOT DETECTED Final   Astrovirus NOT DETECTED NOT DETECTED Final   Norovirus GI/GII NOT DETECTED NOT DETECTED Final   Rotavirus A NOT DETECTED NOT DETECTED Final   Sapovirus (I, II, IV, and V) NOT DETECTED NOT DETECTED Final    Comment: Performed at [facility], [address] [place], [place] [address]  Aerobic/Anaerobic Culture (surgical/deep wound)     Status: None (Preliminary result)   Collection Time: 08/20/19 11:00 AM   Specimen: Wound  Result Value Ref Range Status   Specimen Description WOUND  Final   Special Requests NONE  Final   Gram Stain   Final    NO WBC SEEN NO ORGANISMS SEEN Performed at [facility], [address] [place], [place] [address]    Culture   Final    RARE NORMAL SKIN FLORA NO ANAEROBES ISOLATED; CULTURE IN PROGRESS FOR 5 DAYS    Report Status PENDING  Incomplete  Surgical PCR screen     Status: None   Collection Time: 08/20/19 11:32 AM   Specimen: Nasal Mucosa; Nasal Swab  Result Value Ref Range Status   MRSA, PCR NEGATIVE NEGATIVE Final   Staphylococcus aureus NEGATIVE NEGATIVE Final    Comment: (NOTE) The Xpert SA Assay (FDA approved for NASAL specimens in patients 24 years of age and older), is one component of a comprehensive surveillance program. It is not intended to diagnose infection nor to guide or monitor treatment. Performed at [facility], [address] [place], [place] [address]     Studies/Results: [place] EKG  SITE RITE  Result Date: 08/21/2019 If [facility] image not attached, placement could not be confirmed due to current cardiac rhythm.     Assessment/Plan:  INTERVAL HISTORY:  PICC placed   Principal Problem:   Infected prosthetic knee joint ([place]) Active Problems:   Atrial fibrillation (H[facility])   GI bleed   Enteritis   AKI (acute kidney injury) ([place])   Hypotension   Duodenitis   Gastric ulcer without hemorrhage or perforation   Duodenal ulcer   Non-healing surgical wound    [name] is a 85 y.o. female with dementia, PJI infection hardware infection at site of ORIF  We will  Go with 6 weeks of IV ceftriaxone and oral doxycycline followed by combination of oral therapy such as keflex and doxycyline indefinitely  I have discussed case with Niece [name] and also trying to get in touch with her daughter [name] has an appointment on 09/06/2019 at 65 AM with [facility] is located in the [facility] at  [place] in [place].  [address], which is located to the left of the elevators.  Phone: [phone]  Fax: [phone]  [email or web address]  She should arrive 15 minutes  Prior to her appt.  I will sing off for now   Please call with further questions.     LOS: 5 days   Alcide Evener 08/23/2019, 3:38 PM

## 2019-08-24 DIAGNOSIS — T847XXS Infection and inflammatory reaction due to other internal orthopedic prosthetic devices, implants and grafts, sequela: Secondary | ICD-10-CM

## 2019-08-24 DIAGNOSIS — T847XXD Infection and inflammatory reaction due to other internal orthopedic prosthetic devices, implants and grafts, subsequent encounter: Secondary | ICD-10-CM

## 2019-08-24 MED ORDER — DOXYCYCLINE HYCLATE 100 MG PO CAPS: 100.0000 mg | ORAL_CAPSULE | Freq: Two times a day (BID) | ORAL | 0 refills | Status: DC

## 2019-08-24 MED ORDER — HEPARIN SOD (PORK) LOCK FLUSH 100 UNIT/ML IV SOLN
250.0000 [IU] | INTRAVENOUS | Status: AC | PRN
  Administered 2019-08-24: 14:00:00 250 [IU]
  Filled 2019-08-24: qty 2.5

## 2019-08-24 MED ORDER — OXYCODONE HCL 5 MG PO TABS: 5.0000 mg | ORAL_TABLET | Freq: Four times a day (QID) | ORAL | 0 refills | Status: AC | PRN

## 2019-08-24 MED ORDER — CEFTRIAXONE IV (FOR PTA / DISCHARGE USE ONLY): 2.0000 g | INTRAVENOUS | 0 refills | Status: DC

## 2019-08-24 MED ORDER — CEFTRIAXONE IV (FOR PTA / DISCHARGE USE ONLY): 2.0000 g | INTRAVENOUS | 0 refills | Status: AC

## 2019-08-24 MED ORDER — OXYCODONE HCL 5 MG PO TABS: 5.0000 mg | ORAL_TABLET | Freq: Four times a day (QID) | ORAL | 0 refills | Status: DC | PRN

## 2019-08-24 MED ORDER — PANTOPRAZOLE SODIUM 40 MG PO TBEC: 40.0000 mg | DELAYED_RELEASE_TABLET | Freq: Two times a day (BID) | ORAL | Status: AC

## 2019-08-24 NOTE — Progress Notes (Signed)
Patient ID: [name], female   DOB: [birth year], 86 y.o.   MRN: [id number] Subjective: 3 Days Post-Op Procedure(s) (LRB): IRRIGATION AND DEBRIDEMENT EXTREMITY (Right)    Patient pleasantly demented.  No issues  Objective:   VITALS:   Vitals:   08/24/19 0014 08/24/19 0424  BP: (!) 114/55 121/65  Pulse: 75 76  Resp: 16 10  Temp: 98.3 F (36.8 C) 98.3 F (36.8 C)  SpO2: 100% 97%    Neurovascular intact Incision: no drainage  Dressing changed today, minor swelling and bruising  LABS Recent Labs    08/21/19 0811 08/22/19 0330 08/23/19 0500  HGB 11.2* 11.3* 10.5*  HCT 34.5* 33.4* 31.5*  WBC 8.1 9.3 7.3  PLT 150 147* 133*    Recent Labs    08/21/19 1851 08/22/19 0330 08/23/19 1000  NA 137 139 137  K 3.0* 3.1* 3.8  BUN 23 19 20   CREATININE 1.36* 1.19* 1.35*  GLUCOSE 119* 98 143*    No results for input(s): LABPT, INR in the last 72 hours.   Assessment/Plan: 3 Days Post-Op Procedure(s) (LRB): IRRIGATION AND DEBRIDEMENT EXTREMITY (Right)  Plan: Appreciate ID consult and recs Maintain the dressing I applied to the lateral wound until I see her back in the office in 2 weeks The anterior wounds can be dressed as needed  RTC in weeks for recheck and Xrays  I will try to get in touch with family today to discuss this complex issue

## 2019-08-24 NOTE — Progress Notes (Signed)
Physical Therapy Treatment Patient Details Name: [name] MRN: [id number] DOB: Apr 14, 1925 Today's Date: 08/24/2019    History of Present Illness 86 year old female hx CHF (no echo in chart), Afib not on AC, asthma, bronchiectasis, CKD 3, hypothyroidism, HTN, HLD , dementia and recent ORIF 06/26/2019 for periprosthetic femur fracture who presents with abdominal pain for a few days associated with melena for the last day. now s/p EGD, admitted to ICU for pressors due to continued hypotension after fluid resusitation w/ 3.5L.  Found to have wound dehiscence R ORIF and went for I&D on 08/21/19.    PT Comments    Patient progressing this session able to ambulate a few steps in the room, but limited with pain.  She has no pain at rest, but with movement so RN medicated during treatement.  She remains appropriate for SNF level rehab at d/c.    Follow Up Recommendations  SNF;Supervision/Assistance - 24 hour     Equipment Recommendations  None recommended by PT    Recommendations for Other Services       Precautions / Restrictions Precautions Precautions: Fall Restrictions RLE Weight Bearing: Weight bearing as tolerated    Mobility  Bed Mobility Overal bed mobility: Needs Assistance Bed Mobility: Supine to Sit     Supine to sit: Mod assist;+2 for safety/equipment     General bed mobility comments: assist for R LE off bed and to scoot out  Transfers Overall transfer level: Needs assistance Equipment used: Rolling walker (2 wheeled) Transfers: Sit to/from Stand Sit to Stand: Mod assist;+2 safety/equipment         General transfer comment: lifting help  Ambulation/Gait Ambulation/Gait assistance: Mod assist;+2 safety/equipment Gait Distance (Feet): 8 Feet Assistive device: Rolling walker (2 wheeled) Gait Pattern/deviations: Step-to pattern;Antalgic;Trunk flexed;Decreased stride length;Shuffle     General Gait Details: assist for balance, painful with R LE weight  bearing   Stairs             Wheelchair Mobility    Modified Rankin (Stroke Patients Only)       Balance Overall balance assessment: Needs assistance Sitting-balance support: Feet supported Sitting balance-Leahy Scale: Fair     Standing balance support: During functional activity Standing balance-Leahy Scale: Poor Standing balance comment: min A for balance with UE support                            Cognition Arousal/Alertness: Awake/alert Behavior During Therapy: WFL for tasks assessed/performed Overall Cognitive Status: No family/caregiver present to determine baseline cognitive functioning                                 General Comments: history of dementia      Exercises General Exercises - Lower Extremity Ankle Circles/Pumps: AROM;10 reps;Supine Quad Sets: AROM;5 reps;Supine;Right Heel Slides: AROM;AAROM;5 reps;Supine;Both Hip ABduction/ADduction: AROM;5 reps;Supine(hip IR to pillow)    General Comments        Pertinent Vitals/Pain Pain Assessment: Faces Faces Pain Scale: Hurts even more Pain Location: R LE with mobility Pain Descriptors / Indicators: Grimacing;Operative site guarding Pain Intervention(s): Monitored during session;Repositioned;RN gave pain meds during session    Home Living                      Prior Function            PT Goals (current goals can now be  found in the care plan section) Progress towards PT goals: Progressing toward goals    Frequency    Min 3X/week      PT Plan Current plan remains appropriate    Co-evaluation              AM-PAC PT "6 Clicks" Mobility   Outcome Measure  Help needed turning from your back to your side while in a flat bed without using bedrails?: A Lot Help needed moving from lying on your back to sitting on the side of a flat bed without using bedrails?: A Lot Help needed moving to and from a bed to a chair (including a wheelchair)?: A  Lot Help needed standing up from a chair using your arms (e.g., wheelchair or bedside chair)?: A Lot Help needed to walk in hospital room?: A Lot Help needed climbing 3-5 steps with a railing? : Total 6 Click Score: 11    End of Session Equipment Utilized During Treatment: Gait belt Activity Tolerance: Patient limited by pain Patient left: in chair;with call bell/phone within reach;with chair alarm set   PT Visit Diagnosis: Muscle weakness (generalized) (M62.81);Difficulty in walking, not elsewhere classified (R26.2);Pain Pain - Right/Left: Right Pain - part of body: Knee     Time: 4944-9675 PT Time Calculation (min) (ACUTE ONLY): 32 min  Charges:  $Gait Training: 8-22 mins $Therapeutic Exercise: 8-22 mins                     [name], [facility] [phone] 08/24/2019    [name] 08/24/2019, 5:05 PM

## 2019-08-24 NOTE — Discharge Summary (Signed)
Physician Discharge Summary  [name] VXB:[id number] DOB: 22-Jul-1924 DOA: 08/18/2019  PCP: Janora Norlander, DO  Admit date: 08/18/2019 Discharge date: 08/24/2019  Admitted From: Home Disposition: SNF  Recommendations for Outpatient Follow-up:  Follow up with PCP in 1-2 weeks Follow-up with Dr. Alvan Dame as scheduled Follow-up with infectious disease as scheduled  Home Health: None Equipment/Devices: None  Discharge Condition: Stable CODE STATUS: Full code Diet recommendation: Regular diet  HPI: Per admitting MD, [name] is a 86 y.o. female with medical history significant of dementia, CHF, colon polyps, diverticulosis, gastritis, hiatal hernia, hypertension, hyperlipidemia, paroxysmal A. fib not on anticoagulation, distal femur fracture status post ORIF in March 2021 here from her nursing home for evaluation of melen and hypotension.  It is reported that patient is coming from [facility].  She has been having a poor appetite and has been on Lasix for diuresis.  She also has some mild diffuse abdominal pain.  She has had several episodes of melena for several days.   Labs done at the nursing home showed creatinine going from 1 to 3 over the last week and BUN up to around 100.  EMS was called because patient appeared more altered and tired.  She was noted to be hypotensive with blood pressure 74/55.  No fevers reported. History provided by patient and niece at bedside.  Patient has not been doing well for the past 1.5 weeks.  She has been complaining of upper and right-sided abdominal pain.  She has had several episodes of nonbloody emesis.  In addition, has had several episodes of dark tarry stools.  Per niece, patient has not had any bright red blood per rectum.  She is currently not on any anticoagulation and was previously on aspirin after her knee surgery. ED Course: Afebrile.  Hypotensive with systolic as low as 33A to 80s.  Labs showing mild  leukocytosis with WBC count 13.  Lactic acid normal.  FOBT positive.  Melanotic stool on exam.  Hemoglobin 11.1, stable compared to prior labs.  Platelet count normal.  BUN 119, creatinine 2.9.  Baseline creatinine 1.0.  LFTs normal.  UA with negative nitrite, small amount of leukocytes, 6-10 WBCs, and rare bacteria.  Urine culture pending.  Blood cultures pending. Chest x-ray showing no active disease. CT renal stone study showing inflammatory stranding about the transverse and ascending portions of the duodenum, consistent with infectious or inflammatory enteritis.  No evidence of urinary tract calculus or hydronephrosis. CT head negative for acute intracranial abnormality.  Patient was given IV Protonix 40 mg, Cipro, Flagyl, and 2.5 L normal saline boluses.  Hospital Course / Discharge diagnoses: Principal Problem Hypovolemic shock due to GI bleed-patient was initially admitted to the ICU.  Gastroenterology was consulted and patient underwent an EGD which showed duodenal ulcers, full report below. She was placed on PPI, her bleeding has resolved and her hemoglobin has remained stable.  She clinically has no evidence of ongoing bleeding.  She is to continue PPI following discharge.  Active Problems Septic right prosthetic knee/wound dehiscence-recent ORIF for periprosthetic femur fracture at [facility].  Orthopedic surgery consulted, she underwent washout in the OR on 4/27 which confirmed septic arthritis.  Infectious disease consulted and recommended ceftriaxone for [redacted] weeks along with oral doxycycline with last day of therapy 10/02/2019, followed by indefinite chronic oral suppressive therapy.  PT worked with patient and recommended SNF, patient agreeable.  Infectious disease will follow patient as an outpatient prior to antibiotics down.  She will be discharged with a PICC line in place Acute kidney injury on chronic kidney disease stage IIIa-creatinine overall stable CHF/PAF/HTN/HLD-continue amiodarone, in  sinus History of asthma-continue inhalers, no wheezing Hypothyroidism-continue Synthroid   Discharge Instructions  Discharge Instructions    [facility] pharmacist to adjust dose for Vancomycin, Aminoglycosides and other anti-infective therapies as requested by physician.   Complete by: As directed    [facility] to provide Cath Flo 50m   Complete by: As directed    Administer for PICC line occlusion and as ordered by physician for other access device issues.   Anaphylaxis Kit: Provided to treat any anaphylactic reaction to the medication being provided to the patient if First Dose or when requested by physician   Complete by: As directed    Epinephrine 169mml vial / amp: Administer 0.70m52m0.70ml41mubcutaneously once for moderate to severe anaphylaxis, nurse to call physician and pharmacy when reaction occurs and call 911 if needed for immediate care   Diphenhydramine 50mg52mIV vial: Administer 25-50mg 67mM PRN for first dose reaction, rash, itching, mild reaction, nurse to call physician and pharmacy when reaction occurs   Sodium Chloride 0.9% NS 500ml I770mdminister if needed for hypovolemic blood pressure drop or as ordered by physician after call to physician with anaphylactic reaction   Change dressing on IV access line weekly and PRN   Complete by: As directed    Flush IV access with Sodium Chloride 0.9% and Heparin 10 units/ml or 100 units/ml   Complete by: As directed    Home infusion instructions - [facility]   Complete by: As directed    Instructions: Flush IV access with Sodium Chloride 0.9% and Heparin 10units/ml or 100units/ml   Change dressing on IV access line: Weekly and PRN   Instructions Cath Flo 2mg: Ad55mister for PICC Line occlusion and as ordered by physician for other access device   [facility] pharmacist to adjust dose for: Vancomycin, Aminoglycosides and other anti-infective therapies as requested by physician    Method of administration may be changed at the discretion of home infusion pharmacist based upon assessment of the patient and/or caregiver's ability to self-administer the medication ordered   Complete by: As directed      Allergies as of 08/24/2019      Reactions   Simvastatin Other (See Comments)   Leg pain- "Allergic," per MAR   Ace Inhibitors Cough   "Allergic," per MAR     [facility]    STOP taking these medications   losartan 100 MG tablet Commonly known as: COZAAR   nystatin cream Commonly known as: MYCOSTATIN     TAKE these medications   acetaminophen 500 MG tablet Commonly known as: TYLENOL Take 500 mg by mouth every 4 (four) hours as needed for mild pain. What changed: Another medication with the same name was removed. Continue taking this medication, and follow the directions you see here.   albuterol 108 (90 Base) MCG/ACT inhaler Commonly known as: ProAir HFA Inhale 2 puffs into the lungs every 6 (six) hours as needed. What changed: reasons to take this   amiodarone 100 MG tablet Commonly known as: PACERONE Take 100 mg by mouth daily. What changed: Another medication with the same name was removed. Continue taking this medication, and follow the directions you see here.   ARIPiprazole 5 MG tablet Commonly known as: ABILIFY Take 5 mg by mouth daily.   budesonide-formoterol 160-4.5 MCG/ACT inhaler Commonly known as: SYMBICORT  Inhale 2 puffs into the lungs 2 (two) times daily.   busPIRone 5 MG tablet Commonly known as: BUSPAR Take 1 tablet (5 mg total) by mouth at bedtime.   cefTRIAXone  IVPB Commonly known as: ROCEPHIN Inject 2 g into the vein daily. Via PICC Line. Indication:  Prosthetic knee infection First Dose: yes Last Day of Therapy:  10/02/2019 Labs - Once weekly:  CBC/D and BMP, Labs - Every other week:  ESR and CRP Method of administration: IV Push Method of administration may be changed at the discretion of home infusion pharmacist  based upon assessment of the patient and/or caregiver's ability to self-administer the medication ordered.   donepezil 10 MG tablet Commonly known as: ARICEPT Take 1 tablet (10 mg total) by mouth daily. What changed: when to take this   doxycycline 100 MG capsule Commonly known as: VIBRAMYCIN Take 1 capsule (100 mg total) by mouth 2 (two) times daily. Continue this medication through 10/02/2019.   furosemide 20 MG tablet Commonly known as: LASIX TAKE 2 TABLET A DAY AS DIRECTED. What changed:   how much to take  how to take this  when to take this  additional instructions   levothyroxine 50 MCG tablet Commonly known as: SYNTHROID Take 50 mcg by mouth daily before breakfast.   memantine 10 MG tablet Commonly known as: NAMENDA TAKE (1) TABLET TWICE A DAY. What changed:   how much to take  how to take this  when to take this  additional instructions   metoprolol succinate 25 MG 24 hr tablet Commonly known as: TOPROL-XL Take 1 tablet (25 mg total) by mouth daily. What changed: when to take this   oxyCODONE 5 MG immediate release tablet Commonly known as: Oxy IR/ROXICODONE Take 1 tablet (5 mg total) by mouth every 6 (six) hours as needed (for pain management).   pantoprazole 40 MG tablet Commonly known as: PROTONIX Take 1 tablet (40 mg total) by mouth 2 (two) times daily.   potassium chloride SA 20 MEQ tablet Commonly known as: KLOR-CON Take 1 tablet (20 mEq total) by mouth daily.   senna 8.6 MG Tabs tablet Commonly known as: SENOKOT Take 1 tablet by mouth 2 (two) times daily.   Spacer/Aero-Holding Dorise Bullion Use with symbicort inhaler BID            Discharge Care Instructions  (From admission, onward)         Start     Ordered   08/24/19 0000  Change dressing on IV access line weekly and PRN  (Home infusion instructions - [facility] )     08/24/19 1245         Follow-up Information    Paralee Cancel, MD. Schedule an  appointment as soon as possible for a visit in 2 weeks.   Specialty: Orthopedic Surgery Contact information: [address] [place] [address] [phone]           Consultations:  Gastroenterology  Orthopedic surgery  Infectious disease  Procedures/Studies:  EGD 4/26 Impression:               - Non-obstructing and mild Schatzki ring.                           - 3 cm hiatal hernia.                           - Normal  upper third of esophagus and middle third                            of esophagus.                           - Non-bleeding gastric ulcers with no stigmata of                            bleeding. Biopsied.                           - Gastritis. Biopsied.                           - Oozing duodenal ulcers with pigmented material.                            Clips were placed. Biopsied. Recommendation:           - Return patient to hospital ward for ongoing care.                           - Clear liquid diet.                           - Continue present medications.                           - Await pathology results.                           - Use Protonix (pantoprazole) 40 mg PO BID for 8                            weeks, then reduce to 40 mg/day for ongoing                            gastric/duodenal prophylaxis.                           - If the biopsies are negative for H pylori, then                            plan for fasting serum gastrin level.                           - If H pylori positive, will plan on appropriate                            antibiotic course followed by stool Ag testing for                            eradication. Will discuss the role and utility of  repeat EGD as outpatient based on subsequent Hg/Hct                            response, clinical evidence of rebleeding, and                            discussion with patient/family at follow-up.                            Otherwise,  given age and co-morbidites, following                            clinically with periodic labs would be an                            appropriate consideration.  DG Knee 1-2 Views Right  Result Date: 08/20/2019 CLINICAL DATA:  History of total knee replacement EXAM: RIGHT KNEE - 1-2 VIEW COMPARISON:  06/28/2019 FINDINGS: Changes of right knee replacement and plate and screw fixation in the distal right femur. Stable appearance since prior study. Distal femoral periprostatic fracture again partially imaged with evidence of some healing and callus. No acute fracture, subluxation or dislocation. IMPRESSION: Stable hardware appearance. No acute bony abnormality. Healing distal femoral fracture. Electronically Signed   By: Rolm Baptise M.D.   On: 08/20/2019 12:11   CT Head Wo Contrast  Result Date: 08/18/2019 CLINICAL DATA:  Fatigue, hypotension, cerebral hemorrhage suspected EXAM: CT HEAD WITHOUT CONTRAST TECHNIQUE: Contiguous axial images were obtained from the base of the skull through the vertex without intravenous contrast. COMPARISON:  07/22/2019 FINDINGS: Brain: No evidence of acute infarction, hemorrhage, hydrocephalus, extra-axial collection or mass lesion/mass effect. Periventricular white matter hypodensity and global volume loss. Mega cisterna magna variant of the posterior fossa. Vascular: No hyperdense vessel or unexpected calcification. Skull: Normal. Negative for fracture or focal lesion. Sinuses/Orbits: No acute finding. Other: None. IMPRESSION: No acute intracranial pathology.  Small-vessel white matter disease. Electronically Signed   By: Eddie Candle M.D.   On: 08/18/2019 17:58   US RENAL  Result Date: 08/19/2019 CLINICAL DATA:  Acute renal injury. EXAM: RENAL / URINARY TRACT ULTRASOUND COMPLETE COMPARISON:  None. FINDINGS: Right Kidney: Renal measurements: 10.5 x 4.1 x 5.0 cm. There is a small amount of right perinephric fluid. Left Kidney: Renal measurements: 9.7 x 4.0 x 4.3 cm.  Echogenicity within normal limits. No mass or hydronephrosis visualized. Bladder: Appears normal for degree of bladder distention. Other: None. IMPRESSION: 1. There is a small amount of right perinephric fluid which is nonspecific. No other abnormalities identified. Electronically Signed   By: Dorise Bullion III M.D   On: 08/19/2019 12:25   DG Chest Port 1 View  Result Date: 08/18/2019 CLINICAL DATA:  86 year old female with weakness, hypotension. Evaluate for pneumonia. EXAM: PORTABLE CHEST 1 VIEW COMPARISON:  Prior chest x-ray 07/22/2019 FINDINGS: Stable cardiac and mediastinal contours. Atherosclerotic calcifications present throughout the aorta. No focal airspace opacity to suggest pneumonia. Chronic bronchitic changes remain stable. Minimal right apical pleuroparenchymal scarring. Evidence of prior bilateral rotator cuff repair. No acute osseous abnormality. IMPRESSION: No active disease. Electronically Signed   By: Jacqulynn Cadet M.D.   On: 08/18/2019 15:29   CT Renal Stone Study  Result Date: 08/18/2019 CLINICAL DATA:  Pro flank pain, hypotension EXAM:  CT ABDOMEN AND PELVIS WITHOUT CONTRAST TECHNIQUE: Multidetector CT imaging of the abdomen and pelvis was performed following the standard protocol without IV contrast. COMPARISON:  None. FINDINGS: Lower chest: No acute abnormality.  Coronary artery calcifications. Hepatobiliary: No solid liver abnormality is seen. No gallstones, gallbladder wall thickening, or biliary dilatation. Pancreas: Unremarkable. No pancreatic ductal dilatation or surrounding inflammatory changes. Spleen: Normal in size without significant abnormality. Adrenals/Urinary Tract: Adrenal glands are unremarkable. Kidneys are normal, without renal calculi, solid lesion, or hydronephrosis. Bladder is unremarkable. Stomach/Bowel: Stomach is within normal limits. Appendix appears normal. There is inflammatory stranding about the transverse and ascending portions of the duodenum  (series 3, image 30). Sigmoid diverticulosis. Vascular/Lymphatic: Aortic atherosclerosis. No enlarged abdominal or pelvic lymph nodes. Reproductive: No mass or other significant abnormality. Other: No abdominal wall hernia or abnormality. No abdominopelvic ascites. Musculoskeletal: No acute or significant osseous findings. Status post right hip total arthroplasty. IMPRESSION: 1. Inflammatory stranding about the transverse and ascending portions of the duodenum, consistent with infectious or inflammatory enteritis. 2.  No evidence of urinary tract calculus or hydronephrosis. 3.  Coronary artery disease.  Aortic Atherosclerosis (ICD10-I70.0). Electronically Signed   By: Eddie Candle M.D.   On: 08/18/2019 18:03   DG FEMUR 1V RIGHT  Result Date: 08/20/2019 CLINICAL DATA:  Right hip replacement. Total right knee replacement. EXAM: RIGHT FEMUR 1 VIEW COMPARISON:  Knee series 06/28/2019, hip series 06/21/2019 FINDINGS: changes of right hip replacement and right knee replacement. Plate and screw fixation device noted in the distal right femur. Hardware is stable when compared to prior studies. No hardware complicating feature. No acute bony abnormality. No acute fracture, subluxation or dislocation. Probable old right inferior pubic ramus fracture, stable since prior study. IMPRESSION: Prior right hip replacement and right knee replacement as well as plate and screw fixation in the distal right femur. No change since prior studies. No acute bony abnormality. Electronically Signed   By: Rolm Baptise M.D.   On: 08/20/2019 12:09   [place] EKG SITE RITE  Result Date: 08/21/2019 If Site Rite image not attached, placement could not be confirmed due to current cardiac rhythm.     Subjective: - no chest pain, shortness of breath, no abdominal pain, nausea or vomiting.   Discharge Exam: BP (!) 117/55 (BP Location: Right Arm)   Pulse 75   Temp 97.8 F (36.6 C) (Oral)   Resp 15   Ht 5' 6" (1.676 m)   Wt 58.4 kg    SpO2 94%   BMI 20.78 kg/m   General: Pt is alert, awake, not in acute distress Cardiovascular: RRR, S1/S2 +, no rubs, no gallops Respiratory: CTA bilaterally, no wheezing, no rhonchi Abdominal: Soft, NT, ND, bowel sounds + Extremities: no edema, no cyanosis    The results of significant diagnostics from this hospitalization (including imaging, microbiology, ancillary and laboratory) are listed below for reference.     Microbiology: Recent Results (from the past 240 hour(s))  Urine culture     Status: Abnormal   Collection Time: 08/18/19  6:57 PM   Specimen: Urine, Random  Result Value Ref Range Status   Specimen Description URINE, RANDOM  Final   Special Requests NONE  Final   Culture (A)  Final    40,000 COLONIES/mL DIPHTHEROIDS(CORYNEBACTERIUM SPECIES) Standardized susceptibility testing for this organism is not available. Performed at [facility], [place] [address] [place], [place] [address]    Report Status 08/19/2019 FINAL  Final  Blood culture (routine x 2)  Status: None   Collection Time: 08/18/19  7:04 PM   Specimen: BLOOD RIGHT ARM  Result Value Ref Range Status   Specimen Description BLOOD RIGHT ARM  Final   Special Requests   Final    BOTTLES DRAWN AEROBIC AND ANAEROBIC Blood Culture results may not be optimal due to an inadequate volume of blood received in culture bottles   Culture   Final    NO GROWTH 5 DAYS Performed at [facility], [place] [address], [place], [place] [address]    Report Status 08/23/2019 FINAL  Final  Respiratory Panel by RT PCR (Flu A&B, Covid) - Nasopharyngeal Swab     Status: None   Collection Time: 08/18/19 10:57 PM   Specimen: Nasopharyngeal Swab  Result Value Ref Range Status   SARS Coronavirus 2 by RT PCR NEGATIVE NEGATIVE Final    Comment: (NOTE) SARS-CoV-2 target nucleic acids are NOT DETECTED. The SARS-CoV-2 RNA is generally detectable in upper respiratoy specimens during the acute phase of infection. The  lowest concentration of SARS-CoV-2 viral copies this assay can detect is 131 copies/mL. A negative result does not preclude SARS-Cov-2 infection and should not be used as the sole basis for treatment or other patient management decisions. A negative result may occur with  improper specimen collection/handling, submission of specimen other than nasopharyngeal swab, presence of viral mutation(s) within the areas targeted by this assay, and inadequate number of viral copies (<131 copies/mL). A negative result must be combined with clinical observations, patient history, and epidemiological information. The expected result is Negative. Fact Sheet for Patients:  PinkCheek.be Fact Sheet for Healthcare Providers:  GravelBags.it This test is not yet ap proved or cleared by the [place] FDA and  has been authorized for detection and/or diagnosis of SARS-CoV-2 by FDA under an Emergency Use Authorization (EUA). This EUA will remain  in effect (meaning this test can be used) for the duration of the COVID-19 declaration under Section 564(b)(1) of the Act, 21 U.S.C. section 360bbb-3(b)(1), unless the authorization is terminated or revoked sooner.    Influenza A by PCR NEGATIVE NEGATIVE Final   Influenza B by PCR NEGATIVE NEGATIVE Final    Comment: (NOTE) The Xpert Xpress SARS-CoV-2/FLU/RSV assay is intended as an aid in  the diagnosis of influenza from Nasopharyngeal swab specimens and  should not be used as a sole basis for treatment. Nasal washings and  aspirates are unacceptable for Xpert Xpress SARS-CoV-2/FLU/RSV  testing. Fact Sheet for Patients: PinkCheek.be Fact Sheet for Healthcare Providers: GravelBags.it This test is not yet approved or cleared by the [place] FDA and  has been authorized for detection and/or diagnosis of SARS-CoV-2 by  FDA under an Emergency  Use Authorization (EUA). This EUA will remain  in effect (meaning this test can be used) for the duration of the  Covid-19 declaration under Section 564(b)(1) of the Act, 21  U.S.C. section 360bbb-3(b)(1), unless the authorization is  terminated or revoked. Performed at [facility], [place] [address], [place], [place] [address]   Blood culture (routine x 2)     Status: None   Collection Time: 08/18/19 11:08 PM   Specimen: BLOOD  Result Value Ref Range Status   Specimen Description BLOOD LEFT ANTECUBITAL  Final   Special Requests   Final    BOTTLES DRAWN AEROBIC AND ANAEROBIC Blood Culture results may not be optimal due to an excessive volume of blood received in culture bottles   Culture   Final  NO GROWTH 5 DAYS Performed at [facility], [place] [address] [place], [place] [address]    Report Status 08/23/2019 FINAL  Final  MRSA PCR Screening     Status: None   Collection Time: 08/19/19  8:18 PM   Specimen: Nasal Mucosa; Nasopharyngeal  Result Value Ref Range Status   MRSA by PCR NEGATIVE NEGATIVE Final    Comment:        The GeneXpert MRSA Assay (FDA approved for NASAL specimens only), is one component of a comprehensive MRSA colonization surveillance program. It is not intended to diagnose MRSA infection nor to guide or monitor treatment for MRSA infections. Performed at [facility], [place] [address] [place], [place] [address]   Gastrointestinal Panel by PCR , Stool     Status: None   Collection Time: 08/20/19  4:09 AM  Result Value Ref Range Status   Campylobacter species NOT DETECTED NOT DETECTED Final   Plesimonas shigelloides NOT DETECTED NOT DETECTED Final   Salmonella species NOT DETECTED NOT DETECTED Final   Yersinia enterocolitica NOT DETECTED NOT DETECTED Final   Vibrio species NOT DETECTED NOT DETECTED Final   Vibrio cholerae NOT DETECTED NOT DETECTED Final   Enteroaggregative E coli (EAEC) NOT DETECTED NOT DETECTED Final   Enteropathogenic  E coli (EPEC) NOT DETECTED NOT DETECTED Final   Enterotoxigenic E coli (ETEC) NOT DETECTED NOT DETECTED Final   Shiga like toxin producing E coli (STEC) NOT DETECTED NOT DETECTED Final   Shigella/Enteroinvasive E coli (EIEC) NOT DETECTED NOT DETECTED Final   Cryptosporidium NOT DETECTED NOT DETECTED Final   Cyclospora cayetanensis NOT DETECTED NOT DETECTED Final   Entamoeba histolytica NOT DETECTED NOT DETECTED Final   Giardia lamblia NOT DETECTED NOT DETECTED Final   Adenovirus F40/41 NOT DETECTED NOT DETECTED Final   Astrovirus NOT DETECTED NOT DETECTED Final   Norovirus GI/GII NOT DETECTED NOT DETECTED Final   Rotavirus A NOT DETECTED NOT DETECTED Final   Sapovirus (I, II, IV, and V) NOT DETECTED NOT DETECTED Final    Comment: Performed at [facility], [place] [place], [place] [address]  Aerobic/Anaerobic Culture (surgical/deep wound)     Status: None (Preliminary result)   Collection Time: 08/20/19 11:00 AM   Specimen: Wound  Result Value Ref Range Status   Specimen Description WOUND  Final   Special Requests NONE  Final   Gram Stain   Final    NO WBC SEEN NO ORGANISMS SEEN Performed at [facility], [address], [place], [place] [address]    Culture   Final    RARE NORMAL SKIN FLORA NO ANAEROBES ISOLATED; CULTURE IN PROGRESS FOR 5 DAYS    Report Status PENDING  Incomplete  Surgical PCR screen     Status: None   Collection Time: 08/20/19 11:32 AM   Specimen: Nasal Mucosa; Nasal Swab  Result Value Ref Range Status   MRSA, PCR NEGATIVE NEGATIVE Final   Staphylococcus aureus NEGATIVE NEGATIVE Final    Comment: (NOTE) The Xpert SA Assay (FDA approved for NASAL specimens in patients 25 years of age and older), is one component of a comprehensive surveillance program. It is not intended to diagnose infection nor to guide or monitor treatment. Performed at [facility], [place] [address] [place], [place] [address]   SARS CORONAVIRUS 2 (TAT 6-24  HRS) Nasopharyngeal Nasopharyngeal Swab     Status: None   Collection Time: 08/23/19 10:23 AM   Specimen: Nasopharyngeal Swab  Result Value Ref Range Status   SARS Coronavirus 2 NEGATIVE NEGATIVE Final    Comment: (NOTE) SARS-CoV-2 target nucleic acids are NOT DETECTED. The SARS-CoV-2 RNA is generally detectable in upper and lower respiratory specimens during the acute phase of infection. Negative results do not preclude SARS-CoV-2 infection, do not rule out co-infections with other pathogens, and should not be used as the sole basis for treatment or other patient management decisions. Negative results must be combined with clinical observations, patient history, and epidemiological information. The expected result is Negative. Fact Sheet for Patients: SugarRoll.be Fact Sheet for Healthcare Providers: https://www.woods-mathews.com/ This test is not yet approved or cleared by the [place] FDA and  has been authorized for detection and/or diagnosis of SARS-CoV-2 by FDA under an Emergency Use Authorization (EUA). This EUA will remain  in effect (meaning this test can be used) for the duration of the COVID-19 declaration under Section 56 4(b)(1) of the Act, 21 U.S.C. section 360bbb-3(b)(1), unless the authorization is terminated or revoked sooner. Performed at [facility], [place] [address], [place], [place] [address]      Labs: Basic Metabolic Panel: Recent Labs  Lab 08/20/19 0332 08/20/19 1854 08/21/19 1851 08/22/19 0330 08/23/19 1000  NA 143 141 137 139 137  K 3.7 3.1* 3.0* 3.1* 3.8  CL 118* 114* 107 110 109  CO2 17* 17* 22 20* 22  GLUCOSE 90 103* 119* 98 143*  BUN 51* 36* _0 CREATININE 1.65* 1.53* 1.36* 1.19* 1.35*  CALCIUM 8.3* 8.0* 7.9* 8.0* 8.3*  MG 1.4*  --   --   --   --   PHOS 3.1  --   --   --   --    Liver Function Tests: Recent Labs  Lab 08/18/19 1645  AST 31  ALT 25  ALKPHOS 88  BILITOT 0.7    PROT 4.5*  ALBUMIN 2.3*   CBC: Recent Labs  Lab 08/18/19 1645 08/19/19 0154 08/20/19 0332 08/20/19 0823 08/21/19 0811 08/22/19 0330 08/23/19 0500  WBC 13.0*   < > 13.0* 10.2 8.1 9.3 7.3  NEUTROABS 10.8*  --   --   --   --   --   --   HGB 11.1*   < > 10.6* 10.2* 11.2* 11.3* 10.5*  HCT 34.2*   < > 32.5* 31.8* 34.5* 33.4* 31.5*  MCV 88.4   < > 86.4 88.1 88.0 86.1 87.5  PLT 227   < > 252 193 150 147* 133*   < > = values in this interval not displayed.   CBG: Recent Labs  Lab 08/21/19 0811  GLUCAP 107*   Hgb A1c No results for input(s): HGBA1C in the last 72 hours. Lipid Profile No results for input(s): CHOL, HDL, LDLCALC, TRIG, CHOLHDL, LDLDIRECT in the last 72 hours. Thyroid function studies No results for input(s): TSH, T4TOTAL, T3FREE, THYROIDAB in the last 72 hours.  Invalid input(s): FREET3 Urinalysis    Component Value Date/Time   COLORURINE YELLOW 08/18/2019 1905   APPEARANCEUR CLEAR 08/18/2019 1905   LABSPEC 1.015 08/18/2019 1905   PHURINE 5.0 08/18/2019 1905   GLUCOSEU NEGATIVE 08/18/2019 [place] NEGATIVE 08/18/2019 [place] 08/18/2019 1905   BILIRUBINUR NEG 01/09/2014 [place] 08/18/2019 1905   PROTEINUR NEGATIVE 08/18/2019 1905   UROBILINOGEN negative 01/09/2014 1041   UROBILINOGEN 0.2 01/23/2007 0836   NITRITE NEGATIVE 08/18/2019 1905   LEUKOCYTESUR SMALL (A) 08/18/2019 1905    FURTHER DISCHARGE  INSTRUCTIONS:   Get Medicines reviewed and adjusted: Please take all your medications with you for your next visit with your Primary MD   Laboratory/radiological data: Please request your Primary MD to go over all hospital tests and procedure/radiological results at the follow up, please ask your Primary MD to get all Hospital records sent to his/her office.   In some cases, they will be blood work, cultures and biopsy results pending at the time of your discharge. Please request that your primary care M.D. goes  through all the records of your hospital data and follows up on these results.   Also Note the following: If you experience worsening of your admission symptoms, develop shortness of breath, life threatening emergency, suicidal or homicidal thoughts you must seek medical attention immediately by calling 911 or calling your MD immediately  if symptoms less severe.   You must read complete instructions/literature along with all the possible adverse reactions/side effects for all the Medicines you take and that have been prescribed to you. Take any new Medicines after you have completely understood and accpet all the possible adverse reactions/side effects.    Do not drive when taking Pain medications or sleeping medications (Benzodaizepines)   Do not take more than prescribed Pain, Sleep and Anxiety Medications. It is not advisable to combine anxiety,sleep and pain medications without talking with your primary care practitioner   Special Instructions: If you have smoked or chewed Tobacco  in the last 2 yrs please stop smoking, stop any regular Alcohol  and or any Recreational drug use.   Wear Seat belts while driving.   Please note: You were cared for by a hospitalist during your hospital stay. Once you are discharged, your primary care physician will handle any further medical issues. Please note that NO REFILLS for any discharge medications will be authorized once you are discharged, as it is imperative that you return to your primary care physician (or establish a relationship with a primary care physician if you do not have one) for your post hospital discharge needs so that they can reassess your need for medications and monitor your lab values.  Time coordinating discharge: 40 minutes  SIGNED:  Marzetta Board, MD, PhD 08/24/2019, 12:47 PM

## 2019-08-24 NOTE — Progress Notes (Signed)
      INFECTIOUS DISEASE ATTENDING ADDENDUM:   Date: 08/24/2019  Patient name: [name]  Medical record number: [id number]  Date of birth: 25-Mar-1925   Just went over case with [facility]  I also updated her on appt date for her Mom  Will sign off.  Acey Lav 08/24/2019, 8:36 AM

## 2019-08-24 NOTE — Progress Notes (Signed)
Discharge facility notified and given report. [facility] to transport pt. With all belonging. Niece was notified about pt discharge.   Everlean Cherry, RN

## 2019-08-24 NOTE — TOC Transition Note (Signed)
Transition of Care [facility]) - CM/SW Discharge Note   Patient Details  Name: [name] MRN: [id number] Date of Birth: [birth year]  Transition of Care [facility]) CM/SW Contact:  Eduard Roux, LCSWA Phone Number: 08/24/2019, 1:59 PM   Clinical Narrative:     Patient will DC to:  [facility]  DC Date: 08/24/2019 Family Notified: Bonita Quin & Sue Lush  Transport By: Sharin Mons  RN, patient, and facility notified of DC. Discharge Summary sent to facility. RN given number for [phone]. Ambulance transport requested for patient.   Clinical Social Worker signing off.  Antony Blackbird, MSW, LCSWA Clinical Social Worker     Final next level of care: Skilled Nursing Facility Barriers to Discharge: Barriers Resolved   Patient Goals and CMS Choice        Discharge Placement              Patient chooses bed at: [facility] Patient to be transferred to facility by: [facility] Name of family member notified: Bonita Quin and Sue Lush Patient and family notified of of transfer: 08/24/19  Discharge Plan and Services In-house Referral: Clinical Social Work                                   Social Determinants of Health (SDOH) Interventions     Readmission Risk Interventions No flowsheet data found.

## 2019-08-25 LAB — AEROBIC/ANAEROBIC CULTURE W GRAM STAIN (SURGICAL/DEEP WOUND)
Culture: NORMAL
Gram Stain: NONE SEEN

## 2019-08-27 ENCOUNTER — Telehealth: Payer: Self-pay | Admitting: *Deleted

## 2019-08-27 NOTE — Telephone Encounter (Signed)
Contacted [facility] to schedule hospital follow up, was advised that they do not send patients out while still in rehab, will see pcp once rehab is complete.

## 2019-09-03 ENCOUNTER — Inpatient Hospital Stay: Payer: Medicare Other | Admitting: Nurse Practitioner

## 2019-09-05 ENCOUNTER — Inpatient Hospital Stay: Payer: Medicare Other | Admitting: Family Medicine

## 2019-09-06 ENCOUNTER — Encounter: Payer: Self-pay | Admitting: Infectious Diseases

## 2019-09-06 ENCOUNTER — Ambulatory Visit (INDEPENDENT_AMBULATORY_CARE_PROVIDER_SITE_OTHER): Payer: Medicare Other | Admitting: Infectious Diseases

## 2019-09-06 ENCOUNTER — Other Ambulatory Visit: Payer: Self-pay

## 2019-09-06 DIAGNOSIS — T847XXS Infection and inflammatory reaction due to other internal orthopedic prosthetic devices, implants and grafts, sequela: Secondary | ICD-10-CM

## 2019-09-06 DIAGNOSIS — R609 Edema, unspecified: Secondary | ICD-10-CM | POA: Diagnosis not present

## 2019-09-06 DIAGNOSIS — Z792 Long term (current) use of antibiotics: Secondary | ICD-10-CM | POA: Diagnosis not present

## 2019-09-06 NOTE — Assessment & Plan Note (Signed)
No cultures at the time of washout due to previous antibiotic exposure; therefore she is on empiric Doxycycline (avoid nephrotoxic agents) and Ceftriaxone to treat dehisced wound overlying and tunneling to ORIF hardware. Previously in March had high fevers with concern over infection of the lateral incision site; recent surgery with Dr. Charlann Boxer revealed concern with involvement over R knee hardware as well.   She seems to be doing well a few weeks into her IV course of therapy; although she has a similar appearing hematoma in the same spot from what her great niece told me today. We discussed what typical course of treatment with PJIs looks like when we have optimal surgical control; given [name]'s situation with all hardware remaining, would suspect she would benefit from conservative approach to continue chronic suppression indefinitely given original hardware remains. Explained that the goal would be to lower risk for OR return and limb salvage. They agree this would be best.   Will have her back prior to completion of IV therapy with Dr. Daiva Eves in early June.

## 2019-09-06 NOTE — Assessment & Plan Note (Signed)
Hemoglobin has drifted down a bit but < 2 gm from discharge. I informed her family that she will get another follow up check next week again and [facility] is following results.  ESR 8 (although her's were never really elevated) Tolerating regimen without concern for side effects.

## 2019-09-06 NOTE — Progress Notes (Signed)
Patient: [name]  DOB: 01-Jul-1924 MRN: [id number] PCP: Janora Norlander, DO      Patient Active Problem List   Diagnosis Date Noted   Long term (current) use of antibiotics 49/17/9150   Hardware complicating wound infection ([place]) 08/22/2019   Non-healing surgical wound 08/21/2019   Duodenitis    Gastric ulcer without hemorrhage or perforation    Duodenal ulcer    Hypotension 08/19/2019   GI bleed 08/18/2019   Enteritis 08/18/2019   AKI (acute kidney injury) ([place]) 08/18/2019   Chronic kidney disease (CKD), stage III (moderate) 12/18/2018   Aortic atherosclerosis ([place]) 12/19/2017   Bronchiectasis without complication ([place]) 56/97/9480   Hypothyroidism 03/31/2015   CHF (congestive heart failure) ([place]) 11/15/2014   Absolute anemia 10/18/2014   Bruising, spontaneous 09/16/2014   Long term current use of anticoagulant 09/16/2014   Atrial fibrillation ([place]) 04/15/2014   Peripheral edema 04/15/2014   Nodule of right lung 05/27/2013   Memory impairment 03/12/2013   Prophylactic immunotherapy 10/24/2012   Dyspnea 07/06/2012   Extrinsic asthma 08/31/2010   Atrophic vaginitis    Malaise and fatigue    Thoracic scoliosis    Insomnia, idiopathic    Gastritis    Gastroesophageal reflux disease with hiatal hernia    Colon polyps    Diverticulosis    Osteoarthrosis and allied disorders    Hyperlipidemia 02/11/2009   Essential hypertension 02/10/2009     Subjective:  [name] is a 86 y.o. female here for hospital follow up regarding R femoral hardware and R Knee replacement infection. She initially presented to [facility] with severe UGI bleed requiring ICU stay. It was noted that her right lateral wound experienced dehiscence - she was taken for washout of the lateral incision and R knee arthroplasty; no intraoperative cultures were taken and absorbable antibiotic beads were placed. She was started empirically on  Doxycycline PO and Ceftriaxone IV scheduled through June 8th.   She has since followed up with her primary orthopedic team in [facility] - weightbearing status was increased and physical therapy was initiated. She was also allowed to remove the knee immobilizer for ROM work. At the visit in April she had another hematoma present to the lateral incision with erythema and ecchymosis. No tenderness or induration noted. She saw Dr. Alvan Dame earlier this week and also noted to have hematoma in place but incision remained closed.  Her great niece, Seth Bake joins [place] today and helps fill in most of the history since our last visit in the hospital. They have been worried mostly that she has appeared more pale to them and are concerned her GI ulcer is ongoing. She states that the orthopedic teams are not 165% certain whether there was infection involving the knee or not.  [name] states she has been feeling very tired lately and mostly stays in the bed/chair. She has had swelling in her lower extremities R>L with pitting edema. Her legs feel heavy. [name] reports she eats 3 times a day and feels like it is most of what is in front of her. Seth Bake states she feels that she has to push her to eat 50% of the plate.    Review of Systems  Constitutional: Positive for malaise/fatigue. Negative for chills and fever.  HENT: Negative for tinnitus.   Eyes: Negative for blurred vision and photophobia.  Respiratory: Negative for cough and sputum production.   Cardiovascular: Positive for leg swelling. Negative for chest pain.  Gastrointestinal: Negative for abdominal pain, diarrhea,  nausea and vomiting.  Genitourinary: Negative for dysuria.  Musculoskeletal: Negative for falls.  Skin: Negative for itching and rash.  Neurological: Negative for headaches.     Past Medical History:  Diagnosis Date   Abdominal pain    Atrophic vaginitis    Cellulitis 06/2019   CHF (congestive heart failure) (HCC)    Colon polyps    Dr Watt Climes    Diverticulosis    Dr Watt Climes   Essential hypertension, benign    Gastritis    Hiatal hernia    Insomnia, idiopathic    Malaise and fatigue    Osteoarthrosis and allied disorders    Other and unspecified hyperlipidemia    Thoracic scoliosis     Outpatient Medications Prior to Visit  Medication Sig Dispense Refill   acetaminophen (TYLENOL) 500 MG tablet Take 500 mg by mouth every 4 (four) hours as needed for mild pain.     albuterol (PROAIR HFA) 108 (90 Base) MCG/ACT inhaler Inhale 2 puffs into the lungs every 6 (six) hours as needed. (Patient taking differently: Inhale 2 puffs into the lungs every 6 (six) hours as needed for wheezing or shortness of breath. ) 1 Inhaler 11   amiodarone (PACERONE) 100 MG tablet Take 100 mg by mouth daily.     ARIPiprazole (ABILIFY) 5 MG tablet Take 5 mg by mouth daily.     budesonide-formoterol (SYMBICORT) 160-4.5 MCG/ACT inhaler Inhale 2 puffs into the lungs 2 (two) times daily.     busPIRone (BUSPAR) 5 MG tablet Take 1 tablet (5 mg total) by mouth at bedtime. 90 tablet 3   cefTRIAXone (ROCEPHIN) IVPB Inject 2 g into the vein daily. Via PICC Line. Indication:  Prosthetic knee infection First Dose: yes Last Day of Therapy:  10/02/2019 Labs - Once weekly:  CBC/D and BMP, Labs - Every other week:  ESR and CRP Method of administration: IV Push Method of administration may be changed at the discretion of home infusion pharmacist based upon assessment of the patient and/or caregiver's ability to self-administer the medication ordered. 40 Units 0   donepezil (ARICEPT) 10 MG tablet Take 1 tablet (10 mg total) by mouth daily. (Patient taking differently: Take 10 mg by mouth at bedtime. ) 90 tablet 3   doxycycline (VIBRAMYCIN) 100 MG capsule Take 1 capsule (100 mg total) by mouth 2 (two) times daily. Continue this medication through 10/02/2019. 80 capsule 0   furosemide (LASIX) 20 MG tablet TAKE 2 TABLET A DAY AS DIRECTED. (Patient taking  differently: Take 20 mg by mouth in the morning. ) 180 tablet 3   levothyroxine (SYNTHROID) 50 MCG tablet Take 50 mcg by mouth daily before breakfast.     memantine (NAMENDA) 10 MG tablet TAKE (1) TABLET TWICE A DAY. (Patient taking differently: Take 10 mg by mouth 2 (two) times daily. ) 180 tablet 3   metoprolol succinate (TOPROL-XL) 25 MG 24 hr tablet Take 1 tablet (25 mg total) by mouth daily. (Patient taking differently: Take 25 mg by mouth in the morning. ) 90 tablet 3   oxyCODONE (OXY IR/ROXICODONE) 5 MG immediate release tablet Take 1 tablet (5 mg total) by mouth every 6 (six) hours as needed (for pain management). 5 tablet 0   pantoprazole (PROTONIX) 40 MG tablet Take 1 tablet (40 mg total) by mouth 2 (two) times daily.     potassium chloride SA (K-DUR,KLOR-CON) 20 MEQ tablet Take 1 tablet (20 mEq total) by mouth daily. 90 tablet 3   senna (SENOKOT) 8.6 MG TABS tablet  Take 1 tablet by mouth 2 (two) times daily.      Spacer/Aero-Holding Dorise Bullion Use with symbicort inhaler BID 1 each 1   No facility-administered medications prior to visit.     Allergies  Allergen Reactions   Simvastatin Other (See Comments)    Leg pain- "Allergic," per MAR   Ace Inhibitors Cough    "Allergic," per [facility]    Social History   Tobacco Use   Smoking status: Former Smoker    Packs/day: 0.10    Years: 5.00    Pack years: 0.50    Types: Cigarettes   Smokeless tobacco: Never Used   Tobacco comment: pt states only smoke socially in teens  Substance Use Topics   Alcohol use: No   Drug use: No    Family History  Problem Relation Age of Onset   Heart disease Father 86   Cancer Sister     Objective:   Vitals:   09/06/19 1005  BP: 98/60  Pulse: 67  Temp: 97.9 F (36.6 C)  SpO2: 96%   There is no height or weight on file to calculate BMI.  Physical Exam Constitutional:      Appearance: She is well-developed.     Comments: Seated comfortably in chair.   HENT:      Mouth/Throat:     Mouth: No oral lesions.     Dentition: Normal dentition. No dental abscesses.     Pharynx: No oropharyngeal exudate.  Cardiovascular:     Rate and Rhythm: Normal rate and regular rhythm.     Heart sounds: Normal heart sounds.  Pulmonary:     Effort: Pulmonary effort is normal.     Breath sounds: Normal breath sounds.  Abdominal:     General: There is no distension.     Palpations: Abdomen is soft.     Tenderness: There is no abdominal tenderness.  Musculoskeletal:     Right lower leg: Edema (Pitting edema 3-4+ with pain up through thigh. No weeping or blisters observed. ) present.     Left lower leg: Edema (pitting edema 2-3+ through shins) present.  Lymphadenopathy:     Cervical: No cervical adenopathy.  Skin:    General: Skin is warm and dry.     Capillary Refill: Capillary refill takes less than 2 seconds.     Coloration: Skin is pale.     Findings: No rash.  Neurological:     Mental Status: She is alert and oriented to person, place, and time.  Psychiatric:        Judgment: Judgment normal.   LUE PICC is intact without any tenderness or erythema at the site. No swelling of distal extremity. Some dried blood on dressing. Changed 5/11.   Lab Results:  5/04 CBC     5/11 BMP       Assessment & Plan:   Problem List Items Addressed This Visit      Unprioritized   Peripheral edema    She is managed on lasix, although I am not sure her kidney function can tolerate what she may need diuretic wise - we discussed options today for nutritional optimization with adding nutritional supplements and prioritize at least a moderate amount of protein in diet to help correct third spacing. I also ordered knee high compression stockings for her; given she is post surgery, anemic and immobile she would benefit adding compression for her edema and VTE prevention. She has adequate distal pulses on exam.  Hardware complicating wound infection ([place])    No  cultures at the time of washout due to previous antibiotic exposure; therefore she is on empiric Doxycycline (avoid nephrotoxic agents) and Ceftriaxone to treat dehisced wound overlying and tunneling to ORIF hardware. Previously in March had high fevers with concern over infection of the lateral incision site; recent surgery with Dr. Alvan Dame revealed concern with involvement over R knee hardware as well.   She seems to be doing well a few weeks into her IV course of therapy; although she has a similar appearing hematoma in the same spot from what her great niece told me today. We discussed what typical course of treatment with PJIs looks like when we have optimal surgical control; given Ms. Dietrick's situation with all hardware remaining, would suspect she would benefit from conservative approach to continue chronic suppression indefinitely given original hardware remains. Explained that the goal would be to lower risk for OR return and limb salvage. They agree this would be best.   Will have her back prior to completion of IV therapy with Dr. Tommy Medal in early June.        Long term (current) use of antibiotics    Hemoglobin has drifted down a bit but < 2 gm from discharge. I informed her family that she will get another follow up check next week again and [facility] is following results.  ESR 8 (although her's were never really elevated) Tolerating regimen without concern for side effects.          Janene Madeira, MSN, NP-C [facility] for Infectious Disease [email or web address] Pager: [phone] Office: [place]: [phone]   09/06/19  12:51 PM

## 2019-09-06 NOTE — Assessment & Plan Note (Signed)
She is managed on lasix, although I am not sure her kidney function can tolerate what she may need diuretic wise - we discussed options today for nutritional optimization with adding nutritional supplements and prioritize at least a moderate amount of protein in diet to help correct third spacing. I also ordered knee high compression stockings for her; given she is post surgery, anemic and immobile she would benefit adding compression for her edema and VTE prevention. She has adequate distal pulses on exam.

## 2019-09-06 NOTE — Patient Instructions (Addendum)
Nice to see you are doing a bit better than when I saw you last. \\  Please continue your Doxycycline pills and IV Ceftriaxone through your next appointment   Please try to increase protein to help heal from surgery and with fluid retention - Cottage cheese, chicken, eggs, Ensure supplements between meals 1-2 times a day.   Please use knee - high compression stockings to help with edema. Elevate legs as often as you can between physical therapy sessions.   Please return in 4 weeks to see Dr. Daiva Eves before we stop your antibiotics on June 8th.

## 2019-09-08 ENCOUNTER — Encounter (HOSPITAL_COMMUNITY): Payer: Self-pay | Admitting: Emergency Medicine

## 2019-09-08 ENCOUNTER — Emergency Department (HOSPITAL_COMMUNITY)
Admission: EM | Admit: 2019-09-08 | Discharge: 2019-09-09 | Disposition: A | Discharge status: Home / Self Care | Payer: Medicare Other | Attending: Emergency Medicine | Admitting: Emergency Medicine

## 2019-09-08 ENCOUNTER — Emergency Department (HOSPITAL_COMMUNITY): Payer: Medicare Other

## 2019-09-08 DIAGNOSIS — Y9210 Unspecified residential institution as the place of occurrence of the external cause: Secondary | ICD-10-CM | POA: Diagnosis not present

## 2019-09-08 DIAGNOSIS — S2242XA Multiple fractures of ribs, left side, initial encounter for closed fracture: Secondary | ICD-10-CM | POA: Diagnosis not present

## 2019-09-08 DIAGNOSIS — X58XXXA Exposure to other specified factors, initial encounter: Secondary | ICD-10-CM | POA: Diagnosis not present

## 2019-09-08 DIAGNOSIS — Y712 Prosthetic and other implants, materials and accessory cardiovascular devices associated with adverse incidents: Secondary | ICD-10-CM | POA: Diagnosis not present

## 2019-09-08 DIAGNOSIS — Y999 Unspecified external cause status: Secondary | ICD-10-CM | POA: Insufficient documentation

## 2019-09-08 DIAGNOSIS — T829XXA Unspecified complication of cardiac and vascular prosthetic device, implant and graft, initial encounter: Secondary | ICD-10-CM | POA: Insufficient documentation

## 2019-09-08 DIAGNOSIS — I130 Hypertensive heart and chronic kidney disease with heart failure and stage 1 through stage 4 chronic kidney disease, or unspecified chronic kidney disease: Secondary | ICD-10-CM | POA: Insufficient documentation

## 2019-09-08 DIAGNOSIS — I509 Heart failure, unspecified: Secondary | ICD-10-CM | POA: Insufficient documentation

## 2019-09-08 DIAGNOSIS — Z87891 Personal history of nicotine dependence: Secondary | ICD-10-CM | POA: Insufficient documentation

## 2019-09-08 DIAGNOSIS — Z79899 Other long term (current) drug therapy: Secondary | ICD-10-CM | POA: Diagnosis not present

## 2019-09-08 DIAGNOSIS — Z96641 Presence of right artificial hip joint: Secondary | ICD-10-CM | POA: Insufficient documentation

## 2019-09-08 DIAGNOSIS — Y939 Activity, unspecified: Secondary | ICD-10-CM | POA: Diagnosis not present

## 2019-09-08 DIAGNOSIS — N183 Chronic kidney disease, stage 3 unspecified: Secondary | ICD-10-CM | POA: Insufficient documentation

## 2019-09-08 DIAGNOSIS — S299XXA Unspecified injury of thorax, initial encounter: Secondary | ICD-10-CM | POA: Diagnosis present

## 2019-09-08 MED ORDER — ACETAMINOPHEN 325 MG PO TABS
650.0000 mg | ORAL_TABLET | Freq: Once | ORAL | Status: DC
  Filled 2019-09-08: qty 2

## 2019-09-08 NOTE — ED Notes (Signed)
[facility] called to transport pt 

## 2019-09-08 NOTE — ED Triage Notes (Signed)
Pt arrives via [facility] from [facility] for c/o PICC line bleeding in the L arm. Bleeding controlled at this time. Staff reports bleeding started a few hours ago. EMS VS 122/64, HR66, 14 RR, 97%O2. Hx of dementia, resp e/u, nad.

## 2019-09-08 NOTE — Discharge Instructions (Signed)
You have been evaluated for your left chest wall pain.  It appears you have two broken ribs causing pain.  Take tylenol as needed for pain.  Use incentive spirometer (5-10 puffs hourly) for the next week to prevent development of pneumonia.  Your PICC line has been cleansed.  You may continue to use it for antibiotic.  Follow up with your doctor for further care.

## 2019-09-08 NOTE — ED Provider Notes (Signed)
[facility] EMERGENCY DEPARTMENT Provider Note   CSN: [id number] Arrival date & time: 09/08/19  1333     History Chief Complaint  Patient presents with  . Vascular Access Problem    [name] is a 86 y.o. female.  The history is provided by the patient, a relative and medical records. No language interpreter was used.     86 year old female currently being treated for right femoral hardware and right knee hardware replacement infection with IV ceftriaxone as well as p.o. doxycycline presenting complaining of wrist pain.  History is limited due to patient's age, additional history obtained through niece who at bedside.  Per niece, patient has been doing fine, last time that family member was around her was approximately 2 days ago for a doctor's appointment.  This morning, nursing staff at facility called and mentioned noticing blood at the PICC line on her left upper arm.  When niece arrived, patient also endorsed pain to her left chest wall.  When asked if she has any injury patient states "I may have".  Pain is moderate in severity, nonradiating without any associated lightheadedness dizziness nausea or shortness of breath diaphoresis.  She is not on any blood thinner medication.  No complaints of headache, neck pain, back pain or pain anywhere else.  Past Medical History:  Diagnosis Date  . Abdominal pain   . Atrophic vaginitis   . Cellulitis 06/2019  . CHF (congestive heart failure) ([place])   . Colon polyps    Dr Watt Climes  . Diverticulosis    Dr Watt Climes  . Essential hypertension, benign   . Gastritis   . Hiatal hernia   . Insomnia, idiopathic   . Malaise and fatigue   . Osteoarthrosis and allied disorders   . Other and unspecified hyperlipidemia   . Thoracic scoliosis     Patient Active Problem List   Diagnosis Date Noted  . Long term (current) use of antibiotics 09/06/2019  . Hardware complicating wound infection ([place]) 08/22/2019  . Non-healing  surgical wound 08/21/2019  . Duodenitis   . Gastric ulcer without hemorrhage or perforation   . Duodenal ulcer   . Hypotension 08/19/2019  . GI bleed 08/18/2019  . Enteritis 08/18/2019  . AKI (acute kidney injury) ([place]) 08/18/2019  . Chronic kidney disease (CKD), stage III (moderate) 12/18/2018  . Aortic atherosclerosis ([place]) 12/19/2017  . Bronchiectasis without complication ([place]) 67/61/9509  . Hypothyroidism 03/31/2015  . CHF (congestive heart failure) ([place]) 11/15/2014  . Absolute anemia 10/18/2014  . Bruising, spontaneous 09/16/2014  . Long term current use of anticoagulant 09/16/2014  . Atrial fibrillation ([place]) 04/15/2014  . Peripheral edema 04/15/2014  . Nodule of right lung 05/27/2013  . Memory impairment 03/12/2013  . Prophylactic immunotherapy 10/24/2012  . Dyspnea 07/06/2012  . Extrinsic asthma 08/31/2010  . Atrophic vaginitis   . Malaise and fatigue   . Thoracic scoliosis   . Insomnia, idiopathic   . Gastritis   . Gastroesophageal reflux disease with hiatal hernia   . Colon polyps   . Diverticulosis   . Osteoarthrosis and allied disorders   . Hyperlipidemia 02/11/2009  . Essential hypertension 02/10/2009    Past Surgical History:  Procedure Laterality Date  . BIOPSY  08/20/2019   Procedure: BIOPSY;  Surgeon: Lavena Bullion, DO;  Location: [facility];  Service: Gastroenterology;;  . ESOPHAGOGASTRODUODENOSCOPY (EGD) WITH PROPOFOL N/A 08/20/2019   Procedure: ESOPHAGOGASTRODUODENOSCOPY (EGD) WITH PROPOFOL;  Surgeon: Lavena Bullion, DO;  Location: [facility];  Service: Gastroenterology;  Laterality: N/A;  . HEMOSTASIS CLIP PLACEMENT  08/20/2019   Procedure: HEMOSTASIS CLIP PLACEMENT;  Surgeon: Lavena Bullion, DO;  Location: [facility];  Service: Gastroenterology;;  . I & D EXTREMITY Right 08/21/2019   Procedure: IRRIGATION AND DEBRIDEMENT EXTREMITY;  Surgeon: Paralee Cancel, MD;  Location: [facility];  Service: Orthopedics;  Laterality: Right;  . JOINT  REPLACEMENT    . left shoulder surgery     . rt knee   10 1 08   Dr Theda Sers  . Rt rotator  cuff repair  4 1 00  . TOTAL HIP ARTHROPLASTY Right 2021  ?     OB History   No obstetric history on file.     Family History  Problem Relation Age of Onset  . Heart disease Father 5  . Cancer Sister     Social History   Tobacco Use  . Smoking status: Former Smoker    Packs/day: 0.10    Years: 5.00    Pack years: 0.50    Types: Cigarettes  . Smokeless tobacco: Never Used  . Tobacco comment: pt states only smoke socially in teens  Substance Use Topics  . Alcohol use: No  . Drug use: No    Home Medications Prior to Admission medications   Medication Sig Start Date End Date Taking? Authorizing Provider  acetaminophen (TYLENOL) 500 MG tablet Take 500 mg by mouth every 4 (four) hours as needed for mild pain.    [provider]  albuterol (PROAIR HFA) 108 (90 Base) MCG/ACT inhaler Inhale 2 puffs into the lungs every 6 (six) hours as needed. Patient taking differently: Inhale 2 puffs into the lungs every 6 (six) hours as needed for wheezing or shortness of breath.  06/19/18   Chipper Herb, MD  amiodarone (PACERONE) 100 MG tablet Take 100 mg by mouth daily.    [provider]  ARIPiprazole (ABILIFY) 5 MG tablet Take 5 mg by mouth daily.    [provider]  budesonide-formoterol (SYMBICORT) 160-4.5 MCG/ACT inhaler Inhale 2 puffs into the lungs 2 (two) times daily.    [provider]  busPIRone (BUSPAR) 5 MG tablet Take 1 tablet (5 mg total) by mouth at bedtime. 06/19/18   Chipper Herb, MD  cefTRIAXone (ROCEPHIN) IVPB Inject 2 g into the vein daily. Via PICC Line. Indication:  Prosthetic knee infection First Dose: yes Last Day of Therapy:  10/02/2019 Labs - Once weekly:  CBC/D and BMP, Labs - Every other week:  ESR and CRP Method of administration: IV Push Method of administration may be changed at the discretion of home infusion pharmacist based  upon assessment of the patient and/or caregiver's ability to self-administer the medication ordered. 08/24/19 10/03/19  Caren Griffins, MD  donepezil (ARICEPT) 10 MG tablet Take 1 tablet (10 mg total) by mouth daily. Patient taking differently: Take 10 mg by mouth at bedtime.  06/19/18   Chipper Herb, MD  doxycycline (VIBRAMYCIN) 100 MG capsule Take 1 capsule (100 mg total) by mouth 2 (two) times daily. Continue this medication through 10/02/2019. 08/24/19 10/03/19  Caren Griffins, MD  furosemide (LASIX) 20 MG tablet TAKE 2 TABLET A DAY AS DIRECTED. Patient taking differently: Take 20 mg by mouth in the morning.  06/19/18   Chipper Herb, MD  levothyroxine (SYNTHROID) 50 MCG tablet Take 50 mcg by mouth daily before breakfast.    [provider]  memantine (NAMENDA) 10 MG tablet TAKE (1) TABLET TWICE A DAY. Patient taking  differently: Take 10 mg by mouth 2 (two) times daily.  06/19/18   Chipper Herb, MD  metoprolol succinate (TOPROL-XL) 25 MG 24 hr tablet Take 1 tablet (25 mg total) by mouth daily. Patient taking differently: Take 25 mg by mouth in the morning.  06/19/18   Chipper Herb, MD  oxyCODONE (OXY IR/ROXICODONE) 5 MG immediate release tablet Take 1 tablet (5 mg total) by mouth every 6 (six) hours as needed (for pain management). 08/24/19   Caren Griffins, MD  pantoprazole (PROTONIX) 40 MG tablet Take 1 tablet (40 mg total) by mouth 2 (two) times daily. 08/24/19   Caren Griffins, MD  potassium chloride SA (K-DUR,KLOR-CON) 20 MEQ tablet Take 1 tablet (20 mEq total) by mouth daily. 06/19/18   Chipper Herb, MD  senna (SENOKOT) 8.6 MG TABS tablet Take 1 tablet by mouth 2 (two) times daily.     [provider]  Spacer/Aero-Holding Dorise Bullion Use with symbicort inhaler BID 06/19/18   Chipper Herb, MD    Allergies    Simvastatin and Ace inhibitors  Review of Systems   Review of Systems  Constitutional: Negative for fever.  Cardiovascular: Positive for chest  pain.  All other systems reviewed and are negative.   Physical Exam Updated Vital Signs BP (!) 91/52 (BP Location: Right Arm)   Pulse 77   Temp 98 F (36.7 C) (Oral)   Resp 14   SpO2 92%   Physical Exam Vitals and nursing note reviewed.  Constitutional:      General: She is not in acute distress.    Appearance: She is well-developed.     Comments: Frail and elderly female laying in bed in no acute discomfort.  HENT:     Head: Normocephalic and atraumatic.  Eyes:     Conjunctiva/sclera: Conjunctivae normal.  Cardiovascular:     Rate and Rhythm: Normal rate and regular rhythm.     Pulses: Normal pulses.     Heart sounds: Normal heart sounds.  Pulmonary:     Effort: Pulmonary effort is normal.     Breath sounds: Normal breath sounds.  Chest:     Chest wall: Tenderness (Tenderness to left anterolateral inferior chest wall below left breast on palpation without any crepitus emphysema.  No overlying skin changes.) present.  Abdominal:     Palpations: Abdomen is soft.     Tenderness: There is no abdominal tenderness.  Musculoskeletal:     Cervical back: Normal range of motion and neck supple. No tenderness.  Skin:    Findings: No rash.     Comments: PICC line noted in left upper arm, with some small amount of dried blood but appears to be in place and no signs of infection.  Neurological:     Mental Status: She is alert. Mental status is at baseline.  Psychiatric:        Mood and Affect: Mood normal.     ED Results / Procedures / Treatments   Labs (all labs ordered are listed, but only abnormal results are displayed) Labs Reviewed - No data to display  EKG None  Radiology DG Ribs Unilateral W/Chest Left  Result Date: 09/08/2019 CLINICAL DATA:  Rib pain . EXAM: LEFT RIBS AND CHEST - 3+ VIEW COMPARISON:  Chest radiograph 08/18/2019 FINDINGS: Left upper extremity PICC line noted. Aortic atherosclerosis. Normal heart size. No pleural effusion or edema. No airspace  densities. Scoliosis is noted involving the thoracolumbar spine which is convex towards the left. There  is multilevel degenerative disc disease. Bilateral glenohumeral joint osteoarthritis noted. Age-indeterminate fracture is noted involving the anterior aspect of the left fourth and fifth ribs. IMPRESSION: 1. Age-indeterminate fracture involving the anterior aspect of the left fourth and fifth ribs. 2. No acute cardiopulmonary abnormalities. 3.  Aortic Atherosclerosis (ICD10-I70.0). Electronically Signed   By: Kerby Moors M.D.   On: 09/08/2019 17:08    Procedures Procedures (including critical care time)  Medications Ordered in ED Medications  acetaminophen (TYLENOL) tablet 650 mg (650 mg Oral Refused 09/08/19 1704)    ED Course  I have reviewed the triage vital signs and the nursing notes.  Pertinent labs & imaging results that were available during my care of the patient were reviewed by me and considered in my medical decision making (see chart for details).    MDM Rules/Calculators/A&P                      BP (!) 91/52 (BP Location: Right Arm)   Pulse 77   Temp 98 F (36.7 C) (Oral)   Resp 14   SpO2 92%   Final Clinical Impression(s) / ED Diagnoses Final diagnoses:  Closed fracture of two ribs of left side, initial encounter  Complication associated with peripherally inserted central catheter (PICC), initial encounter    Rx / DC Orders ED Discharge Orders    None     4:30 PM Patient has a PICC line to her left upper arm when staff noticed some dried blood at her PICC line site.  Knees also noted that patient is having some left-sided ribs/chest wall pain.  Possible injury.  Will check for PICC line placement including flushing and changing dressing.  Will obtain x-ray of the left ribs and chest to assess PICC line placement and to rule out ribs injury.  Low suspicion for ACS.  No other signs of injury noted.  CAre discussed with Dr. Roslynn Amble  6:37 PM X-ray demonstrate  age-indeterminate fracture involving the anterior aspects of the left fourth and fifth ribs.  PICC line appears to be in place.  The finding of the x-ray corresponds with patient's active pain.  I discussed the use of incentive spirometer as well as Tylenol for definitive rib fractures care.  PICC line can be used as it has been flushed, and dressing change.  Patient may follow-up with PCP for further care.  Return precaution discussed.   Domenic Moras, PA-C 09/08/19 Clarita Leber, MD 09/10/19 [phone]

## 2019-09-08 NOTE — ED Notes (Signed)
Pt. Toileted, privacy maintained.

## 2019-10-01 ENCOUNTER — Other Ambulatory Visit: Payer: Self-pay

## 2019-10-01 ENCOUNTER — Ambulatory Visit (INDEPENDENT_AMBULATORY_CARE_PROVIDER_SITE_OTHER): Payer: Medicare Other | Admitting: Infectious Disease

## 2019-10-01 ENCOUNTER — Encounter: Payer: Self-pay | Admitting: Infectious Disease

## 2019-10-01 VITALS — BP 94/51 | HR 85 | Temp 98.2°F

## 2019-10-01 DIAGNOSIS — T8453XD Infection and inflammatory reaction due to internal right knee prosthesis, subsequent encounter: Secondary | ICD-10-CM

## 2019-10-01 DIAGNOSIS — T847XXD Infection and inflammatory reaction due to other internal orthopedic prosthetic devices, implants and grafts, subsequent encounter: Secondary | ICD-10-CM

## 2019-10-01 DIAGNOSIS — R413 Other amnesia: Secondary | ICD-10-CM

## 2019-10-01 DIAGNOSIS — R5381 Other malaise: Secondary | ICD-10-CM | POA: Diagnosis not present

## 2019-10-01 DIAGNOSIS — T8453XA Infection and inflammatory reaction due to internal right knee prosthesis, initial encounter: Secondary | ICD-10-CM | POA: Insufficient documentation

## 2019-10-01 HISTORY — DX: Infection and inflammatory reaction due to internal right knee prosthesis, initial encounter: T84.53XA

## 2019-10-01 MED ORDER — CEFDINIR 300 MG PO CAPS: 300.0000 mg | ORAL_CAPSULE | Freq: Every day | ORAL | 11 refills | Status: AC

## 2019-10-01 MED ORDER — DOXYCYCLINE HYCLATE 100 MG PO CAPS: 100.0000 mg | ORAL_CAPSULE | Freq: Two times a day (BID) | ORAL | 11 refills | Status: AC

## 2019-10-01 NOTE — Patient Instructions (Signed)
Can be e visit or in person

## 2019-10-01 NOTE — Progress Notes (Signed)
Subjective:  Chief complaint: "I hurt all over"  Patient ID: [name], female    DOB: Aug 10, 1924, 86 y.o.   MRN: [id number]  HPI   86 y.o. female with history of dementia atrial fibrillation and bronchiectasis, chronic kidney disease, prosthetic joint and recent fracture with ORIF on June 26, 2019 who was admitted to the hospital with abdominal pain melena and was found to be suffering from a GI bleed with shock.  He had been had endoscopic by GI which is reviewed duodenal ulcers.  She had a prosthetic knee placed at [facility].  Recently March she suffered a periprosthetic femur fracture and had ORIF on June 26, 2019.  Apparently developed dehiscence of this wound wound foot from the ORIF which had begun to tunnel.  Her niece who was with her at the bedside pointed this out and orthopedics were consulted found that she had dehiscence of the wound deep to the hardware and with involvement of her prosthetic knee joint.  Of note the patient had already been in the hospital in late March with high fevers and concern for infection at the site of her periprosthetic fracture.  She is complaining of pain there as well and had a high temperature.  She was evaluated in the ER and ultimately admitted to the hospitalist service.  Her knee was apparently read at the time it is not appear that she was evaluated by orthopedics.  She was treated with ceftriaxone in the hospital for what was thought to be urinary tract infection plus or minus "superficial surgical site infection.  He was instructed to follow-up with orthopedic surgeon Dr. Case as soon as possible discharge.  Ended the did see the patient on April 7 and noted that her leg immobilizer had been soiled.  He removed his staples and placed her on doxycycline.  While here at [facility] she was given a diagnosis of possible gastroenteritis based on CT scan she was given ciprofloxacin then changed over to ceftriaxone and metronidazole.  Dr. Alvan Dame  took her to the operating room April 27th and performed irrigation and debridement of the knee.  Her prosthetic fracture still required that the hardware remain in place.    We continued her on empiric antibiotics with ceftriaxone but doxycycline for gram-positive give my anxiety about causing renal damage with something like vancomycin.  She is due to complete her ceftriaxone tomorrow at skilled nursing facility where she is residing.  She was seen by Janene Madeira in follow-up reviewed her lab work and found that her inflammatory markers have remained normal.  Patient has had trouble with anemia that has persisted.  She also had a fall again a few weeks ago and suffered 2 rib fractures.  Her knee itself appears to be stable in terms of the pain although she has multiple areas that hurt ~including her back and bilateral hips and chest wall.  Her niece came to the appointment later and was able to give more more detail.  There was some anxiety that they have about her developing antibiotic resistance GU tract to the antibiotic she is given  Past Medical History:  Diagnosis Date  . Abdominal pain   . Atrophic vaginitis   . Cellulitis 06/2019  . CHF (congestive heart failure) ([place])   . Colon polyps    Dr Watt Climes  . Diverticulosis    Dr Watt Climes  . Essential hypertension, benign   . Gastritis   . Hiatal hernia   . Infection of prosthetic right  knee joint ([place]) 10/01/2019  . Insomnia, idiopathic   . Malaise and fatigue   . Osteoarthrosis and allied disorders   . Other and unspecified hyperlipidemia   . Thoracic scoliosis     Past Surgical History:  Procedure Laterality Date  . BIOPSY  08/20/2019   Procedure: BIOPSY;  Surgeon: Lavena Bullion, DO;  Location: [facility];  Service: Gastroenterology;;  . ESOPHAGOGASTRODUODENOSCOPY (EGD) WITH PROPOFOL N/A 08/20/2019   Procedure: ESOPHAGOGASTRODUODENOSCOPY (EGD) WITH PROPOFOL;  Surgeon: Lavena Bullion, DO;  Location: [facility];   Service: Gastroenterology;  Laterality: N/A;  . HEMOSTASIS CLIP PLACEMENT  08/20/2019   Procedure: HEMOSTASIS CLIP PLACEMENT;  Surgeon: Lavena Bullion, DO;  Location: [facility];  Service: Gastroenterology;;  . I & D EXTREMITY Right 08/21/2019   Procedure: IRRIGATION AND DEBRIDEMENT EXTREMITY;  Surgeon: Paralee Cancel, MD;  Location: [facility];  Service: Orthopedics;  Laterality: Right;  . JOINT REPLACEMENT    . left shoulder surgery     . rt knee   10 1 08   Dr Theda Sers  . Rt rotator  cuff repair  4 1 00  . TOTAL HIP ARTHROPLASTY Right 2021  ?    Family History  Problem Relation Age of Onset  . Heart disease Father 66  . Cancer Sister       Social History   Socioeconomic History  . Marital status: Divorced    Spouse name: Not on file  . Number of children: Not on file  . Years of education: Not on file  . Highest education level: Not on file  Occupational History  . Occupation: retired  Tobacco Use  . Smoking status: Former Smoker    Packs/day: 0.10    Years: 5.00    Pack years: 0.50    Types: Cigarettes  . Smokeless tobacco: Never Used  . Tobacco comment: pt states only smoke socially in teens  Substance and Sexual Activity  . Alcohol use: No  . Drug use: No  . Sexual activity: Not on file  Other Topics Concern  . Not on file  Social History Narrative  . Not on file   Social Determinants of Health   Financial Resource Strain:   . Difficulty of Paying Living Expenses:   Food Insecurity:   . Worried About Charity fundraiser in the Last Year:   . Arboriculturist in the Last Year:   Transportation Needs:   . Film/video editor (Medical):   Marland Kitchen Lack of Transportation (Non-Medical):   Physical Activity:   . Days of Exercise per Week:   . Minutes of Exercise per Session:   Stress:   . Feeling of Stress :   Social Connections:   . Frequency of Communication with Friends and Family:   . Frequency of Social Gatherings with Friends and Family:   . Attends  Religious Services:   . Active Member of Clubs or Organizations:   . Attends Archivist Meetings:   Marland Kitchen Marital Status:     Allergies  Allergen Reactions  . Simvastatin Other (See Comments)    Leg pain- "Allergic," per MAR  . Ace Inhibitors Cough    "Allergic," per [facility]     Current Outpatient Medications:  .  acetaminophen (TYLENOL) 500 MG tablet, Take 500 mg by mouth every 4 (four) hours as needed for mild pain., Disp: , Rfl:  .  albuterol (PROAIR HFA) 108 (90 Base) MCG/ACT inhaler, Inhale 2 puffs into the lungs every 6 (six) hours  as needed. (Patient taking differently: Inhale 2 puffs into the lungs every 6 (six) hours as needed for wheezing or shortness of breath. ), Disp: 1 Inhaler, Rfl: 11 .  amiodarone (PACERONE) 100 MG tablet, Take 100 mg by mouth daily., Disp: , Rfl:  .  ARIPiprazole (ABILIFY) 5 MG tablet, Take 5 mg by mouth daily., Disp: , Rfl:  .  budesonide-formoterol (SYMBICORT) 160-4.5 MCG/ACT inhaler, Inhale 2 puffs into the lungs 2 (two) times daily., Disp: , Rfl:  .  busPIRone (BUSPAR) 5 MG tablet, Take 1 tablet (5 mg total) by mouth at bedtime., Disp: 90 tablet, Rfl: 3 .  cefdinir (OMNICEF) 300 MG capsule, Take 1 capsule (300 mg total) by mouth daily., Disp: 30 capsule, Rfl: 11 .  cefTRIAXone (ROCEPHIN) IVPB, Inject 2 g into the vein daily. Via PICC Line. Indication:  Prosthetic knee infection First Dose: yes Last Day of Therapy:  10/02/2019 Labs - Once weekly:  CBC/D and BMP, Labs - Every other week:  ESR and CRP Method of administration: IV Push Method of administration may be changed at the discretion of home infusion pharmacist based upon assessment of the patient and/or caregiver's ability to self-administer the medication ordered., Disp: 40 Units, Rfl: 0 .  donepezil (ARICEPT) 10 MG tablet, Take 1 tablet (10 mg total) by mouth daily. (Patient taking differently: Take 10 mg by mouth at bedtime. ), Disp: 90 tablet, Rfl: 3 .  doxycycline (VIBRAMYCIN) 100 MG capsule,  Take 1 capsule (100 mg total) by mouth 2 (two) times daily. Continue this medication through 10/02/2019., Disp: 120 capsule, Rfl: 11 .  furosemide (LASIX) 20 MG tablet, TAKE 2 TABLET A DAY AS DIRECTED. (Patient taking differently: Take 20 mg by mouth in the morning. ), Disp: 180 tablet, Rfl: 3 .  levothyroxine (SYNTHROID) 50 MCG tablet, Take 50 mcg by mouth daily before breakfast., Disp: , Rfl:  .  memantine (NAMENDA) 10 MG tablet, TAKE (1) TABLET TWICE A DAY. (Patient taking differently: Take 10 mg by mouth 2 (two) times daily. ), Disp: 180 tablet, Rfl: 3 .  metoprolol succinate (TOPROL-XL) 25 MG 24 hr tablet, Take 1 tablet (25 mg total) by mouth daily. (Patient taking differently: Take 25 mg by mouth in the morning. ), Disp: 90 tablet, Rfl: 3 .  oxyCODONE (OXY IR/ROXICODONE) 5 MG immediate release tablet, Take 1 tablet (5 mg total) by mouth every 6 (six) hours as needed (for pain management)., Disp: 5 tablet, Rfl: 0 .  pantoprazole (PROTONIX) 40 MG tablet, Take 1 tablet (40 mg total) by mouth 2 (two) times daily., Disp:  , Rfl:  .  potassium chloride SA (K-DUR,KLOR-CON) 20 MEQ tablet, Take 1 tablet (20 mEq total) by mouth daily., Disp: 90 tablet, Rfl: 3 .  senna (SENOKOT) 8.6 MG TABS tablet, Take 1 tablet by mouth 2 (two) times daily. , Disp: , Rfl:  .  Spacer/Aero-Holding Chambers DEVI, Use with symbicort inhaler BID, Disp: 1 each, Rfl: 1   Review of Systems  Unable to perform ROS: Dementia       Objective:   Physical Exam Constitutional:      General: She is not in acute distress. HENT:     Head: Normocephalic.     Nose: Congestion present.  Cardiovascular:     Rate and Rhythm: Normal rate.  Pulmonary:     Effort: Pulmonary effort is normal. No respiratory distress.     Breath sounds: No wheezing.  Abdominal:     General: There is no distension.  Neurological:  General: No focal deficit present.     Mental Status: She is alert. She is disoriented.  Psychiatric:        Speech:  Speech is delayed.        Cognition and Memory: Cognition is impaired. Memory is impaired. She exhibits impaired recent memory and impaired remote memory.      PICC is CDI     Assessment & Plan:  Prosthetic knee infection: She will finish her IV ceftriaxone tomorrow.  I will then switch in cefdinir 300 mg daily to be continued with doxycycline 100 mg twice daily.  We will try to push for at least 6 months of therapy.  There are certainly risks to these antibiotics including the concerns that her family has about her developing resistance in her GU tract though I do not think the doxycycline make an impact there.  My greater fears for her succumbing to something such as C. difficile colitis.  GI bleed history: to be taking iron  Deconditioning:I am concerned about her overall decline in health and multiple falls. I think that we are unfortunately likely to run in to more problems but we will try to push on with antibiotics for the PJI in the right

## 2019-10-05 ENCOUNTER — Other Ambulatory Visit: Payer: Self-pay

## 2019-10-05 ENCOUNTER — Encounter (HOSPITAL_COMMUNITY): Payer: Self-pay | Admitting: Emergency Medicine

## 2019-10-05 ENCOUNTER — Emergency Department (HOSPITAL_COMMUNITY)
Admission: EM | Admit: 2019-10-05 | Discharge: 2019-10-06 | Disposition: A | Discharge status: Home / Self Care | Payer: Medicare Other | Attending: Emergency Medicine | Admitting: Emergency Medicine

## 2019-10-05 DIAGNOSIS — D500 Iron deficiency anemia secondary to blood loss (chronic): Secondary | ICD-10-CM | POA: Insufficient documentation

## 2019-10-05 DIAGNOSIS — I509 Heart failure, unspecified: Secondary | ICD-10-CM | POA: Diagnosis not present

## 2019-10-05 DIAGNOSIS — Z87891 Personal history of nicotine dependence: Secondary | ICD-10-CM | POA: Insufficient documentation

## 2019-10-05 DIAGNOSIS — Z96651 Presence of right artificial knee joint: Secondary | ICD-10-CM | POA: Insufficient documentation

## 2019-10-05 DIAGNOSIS — E039 Hypothyroidism, unspecified: Secondary | ICD-10-CM | POA: Diagnosis not present

## 2019-10-05 DIAGNOSIS — I130 Hypertensive heart and chronic kidney disease with heart failure and stage 1 through stage 4 chronic kidney disease, or unspecified chronic kidney disease: Secondary | ICD-10-CM | POA: Diagnosis not present

## 2019-10-05 DIAGNOSIS — I4891 Unspecified atrial fibrillation: Secondary | ICD-10-CM | POA: Insufficient documentation

## 2019-10-05 DIAGNOSIS — F0390 Unspecified dementia without behavioral disturbance: Secondary | ICD-10-CM | POA: Insufficient documentation

## 2019-10-05 DIAGNOSIS — Z7901 Long term (current) use of anticoagulants: Secondary | ICD-10-CM | POA: Diagnosis not present

## 2019-10-05 DIAGNOSIS — N183 Chronic kidney disease, stage 3 unspecified: Secondary | ICD-10-CM | POA: Insufficient documentation

## 2019-10-05 DIAGNOSIS — Z79899 Other long term (current) drug therapy: Secondary | ICD-10-CM | POA: Diagnosis not present

## 2019-10-05 DIAGNOSIS — R718 Other abnormality of red blood cells: Secondary | ICD-10-CM | POA: Diagnosis present

## 2019-10-05 DIAGNOSIS — Z96612 Presence of left artificial shoulder joint: Secondary | ICD-10-CM | POA: Diagnosis not present

## 2019-10-05 DIAGNOSIS — Z96641 Presence of right artificial hip joint: Secondary | ICD-10-CM | POA: Diagnosis not present

## 2019-10-05 LAB — COMPREHENSIVE METABOLIC PANEL
ALT: 53 U/L — ABNORMAL HIGH (ref 0–44)
AST: 61 U/L — ABNORMAL HIGH (ref 15–41)
Albumin: 2 g/dL — ABNORMAL LOW (ref 3.5–5.0)
Alkaline Phosphatase: 127 U/L — ABNORMAL HIGH (ref 38–126)
Anion gap: 13 (ref 5–15)
BUN: 41 mg/dL — ABNORMAL HIGH (ref 8–23)
CO2: 24 mmol/L (ref 22–32)
Calcium: 7.8 mg/dL — ABNORMAL LOW (ref 8.9–10.3)
Chloride: 103 mmol/L (ref 98–111)
Creatinine, Ser: 2.34 mg/dL — ABNORMAL HIGH (ref 0.44–1.00)
GFR calc Af Amer: 20 mL/min — ABNORMAL LOW (ref >60)
GFR calc non Af Amer: 17 mL/min — ABNORMAL LOW (ref >60)
Glucose, Bld: 104 mg/dL — ABNORMAL HIGH (ref 70–99)
Potassium: 3.9 mmol/L (ref 3.5–5.1)
Sodium: 140 mmol/L (ref 135–145)
Total Bilirubin: 0.8 mg/dL (ref 0.3–1.2)
Total Protein: 4.3 g/dL — ABNORMAL LOW (ref 6.5–8.1)

## 2019-10-05 LAB — CBC
HCT: 20.1 % — ABNORMAL LOW (ref 36.0–46.0)
Hemoglobin: 6.5 g/dL — CL (ref 12.0–15.0)
MCH: 27.8 pg (ref 26.0–34.0)
MCHC: 32.3 g/dL (ref 30.0–36.0)
MCV: 85.9 fL (ref 80.0–100.0)
Platelets: 182 10*3/uL (ref 150–400)
RBC: 2.34 MIL/uL — ABNORMAL LOW (ref 3.87–5.11)
RDW: 18.9 % — ABNORMAL HIGH (ref 11.5–15.5)
WBC: 6.4 10*3/uL (ref 4.0–10.5)
nRBC: 3.8 % — ABNORMAL HIGH (ref 0.0–0.2)

## 2019-10-05 LAB — PREPARE RBC (CROSSMATCH)

## 2019-10-05 LAB — POC OCCULT BLOOD, ED: Fecal Occult Bld: NEGATIVE

## 2019-10-05 MED ORDER — SODIUM CHLORIDE 0.9 % IV BOLUS
1000.0000 mL | Freq: Once | INTRAVENOUS | Status: AC
  Administered 2019-10-05: 1000 mL via INTRAVENOUS

## 2019-10-05 MED ORDER — SODIUM CHLORIDE 0.9 % IV SOLN: 10.0000 mL/h | Freq: Once | INTRAVENOUS | Status: DC

## 2019-10-05 NOTE — ED Notes (Signed)
Transfuse 2 units pRBC

## 2019-10-05 NOTE — ED Provider Notes (Signed)
[facility]-EMERGENCY DEPT Provider Note   CSN: [id number] Arrival date & time: 10/05/19  1531     History Chief Complaint  Patient presents with  . Abnormal Lab    hgb 6.7    [name] is a 86 y.o. female with a history of dementia, recurrent bleeding gastric ulcers, anemia, hypertension, presented to the ED with low hemoglobin.  Patient presents by EMS from [facility] with a hemoglobin of 6.7 noted on blood work today.  Per the paperwork she arrives with, she has had a progressive decline in her hemoglobin over the past month, a loss of nearly 2 g.  She is also been having dark and tarry stools.  Highly suspicious for GI bleed.  She has been seen by GI doctor 2 weeks ago for this issue, at which time an endoscopy was offered, but the patient and her family refused at that time.  They would strongly prefer to avoid any procedure that would involve anesthesia given the patient's age and her frailty.  She has received blood transfusions in the past.  Additional history was provided by the patient's niece who is her POA Carlynn Spry, who agrees with blood transfusions if necessary in the ER and hospital.  She tells me the patient remains FULL CODE for now, and the family has not yet decided on hospice or comfort care measures. They would prefer to minimize her hospitalizations if possible.  HPI     Past Medical History:  Diagnosis Date  . Abdominal pain   . Atrophic vaginitis   . Cellulitis 06/2019  . CHF (congestive heart failure) (HCC)   . Colon polyps    Dr Ewing Schlein  . Diverticulosis    Dr Ewing Schlein  . Essential hypertension, benign   . Gastritis   . Hiatal hernia   . Infection of prosthetic right knee joint (HCC) 10/01/2019  . Insomnia, idiopathic   . Malaise and fatigue   . Osteoarthrosis and allied disorders   . Other and unspecified hyperlipidemia   . Thoracic scoliosis     Patient Active Problem List   Diagnosis Date Noted  . Infection of  prosthetic right knee joint (HCC) 10/01/2019  . Long term (current) use of antibiotics 09/06/2019  . Hardware complicating wound infection (HCC) 08/22/2019  . Non-healing surgical wound 08/21/2019  . Duodenitis   . Gastric ulcer without hemorrhage or perforation   . Duodenal ulcer   . Hypotension 08/19/2019  . GI bleed 08/18/2019  . Enteritis 08/18/2019  . AKI (acute kidney injury) (HCC) 08/18/2019  . Chronic kidney disease (CKD), stage III (moderate) 12/18/2018  . Aortic atherosclerosis (HCC) 12/19/2017  . Bronchiectasis without complication (HCC) 12/19/2017  . Hypothyroidism 03/31/2015  . CHF (congestive heart failure) (HCC) 11/15/2014  . Absolute anemia 10/18/2014  . Bruising, spontaneous 09/16/2014  . Long term current use of anticoagulant 09/16/2014  . Atrial fibrillation (HCC) 04/15/2014  . Peripheral edema 04/15/2014  . Nodule of right lung 05/27/2013  . Memory impairment 03/12/2013  . Prophylactic immunotherapy 10/24/2012  . Dyspnea 07/06/2012  . Extrinsic asthma 08/31/2010  . Atrophic vaginitis   . Malaise and fatigue   . Thoracic scoliosis   . Insomnia, idiopathic   . Gastritis   . Gastroesophageal reflux disease with hiatal hernia   . Colon polyps   . Diverticulosis   . Osteoarthrosis and allied disorders   . Hyperlipidemia 02/11/2009  . Essential hypertension 02/10/2009    Past Surgical History:  Procedure Laterality Date  .  BIOPSY  08/20/2019   Procedure: BIOPSY;  Surgeon: Lavena Bullion, DO;  Location: [facility];  Service: Gastroenterology;;  . ESOPHAGOGASTRODUODENOSCOPY (EGD) WITH PROPOFOL N/A 08/20/2019   Procedure: ESOPHAGOGASTRODUODENOSCOPY (EGD) WITH PROPOFOL;  Surgeon: Lavena Bullion, DO;  Location: [facility];  Service: Gastroenterology;  Laterality: N/A;  . HEMOSTASIS CLIP PLACEMENT  08/20/2019   Procedure: HEMOSTASIS CLIP PLACEMENT;  Surgeon: Lavena Bullion, DO;  Location: [facility];  Service: Gastroenterology;;  . I & D EXTREMITY  Right 08/21/2019   Procedure: IRRIGATION AND DEBRIDEMENT EXTREMITY;  Surgeon: Paralee Cancel, MD;  Location: [facility];  Service: Orthopedics;  Laterality: Right;  . JOINT REPLACEMENT    . left shoulder surgery     . rt knee   10 1 08   Dr Theda Sers  . Rt rotator  cuff repair  4 1 00  . TOTAL HIP ARTHROPLASTY Right 2021  ?     OB History   No obstetric history on file.     Family History  Problem Relation Age of Onset  . Heart disease Father 79  . Cancer Sister     Social History   Tobacco Use  . Smoking status: Former Smoker    Packs/day: 0.10    Years: 5.00    Pack years: 0.50    Types: Cigarettes  . Smokeless tobacco: Never Used  . Tobacco comment: pt states only smoke socially in teens  Vaping Use  . Vaping Use: Never used  Substance Use Topics  . Alcohol use: No  . Drug use: No    Home Medications Prior to Admission medications   Medication Sig Start Date End Date Taking? Authorizing Provider  acetaminophen (TYLENOL) 325 MG tablet Take 650 mg by mouth in the morning, at noon, and at bedtime.   Yes [provider]  albuterol (PROAIR HFA) 108 (90 Base) MCG/ACT inhaler Inhale 2 puffs into the lungs every 6 (six) hours as needed. Patient taking differently: Inhale 2 puffs into the lungs every 6 (six) hours as needed for wheezing or shortness of breath.  06/19/18  Yes Chipper Herb, MD  amiodarone (PACERONE) 100 MG tablet Take 100 mg by mouth daily.   Yes [provider]  ARIPiprazole (ABILIFY) 5 MG tablet Take 5 mg by mouth daily.   Yes [provider]  budesonide-formoterol (SYMBICORT) 160-4.5 MCG/ACT inhaler Inhale 2 puffs into the lungs 2 (two) times daily.   Yes [provider]  busPIRone (BUSPAR) 5 MG tablet Take 1 tablet (5 mg total) by mouth at bedtime. 06/19/18  Yes Chipper Herb, MD  cefdinir (OMNICEF) 300 MG capsule Take 1 capsule (300 mg total) by mouth daily. 10/01/19  Yes Tommy Medal, Lavell Islam, MD  docusate sodium (COLACE) 100  MG capsule Take 100 mg by mouth daily.   Yes [provider]  donepezil (ARICEPT) 10 MG tablet Take 1 tablet (10 mg total) by mouth daily. Patient taking differently: Take 10 mg by mouth at bedtime.  06/19/18  Yes Chipper Herb, MD  doxycycline (VIBRAMYCIN) 100 MG capsule Take 1 capsule (100 mg total) by mouth 2 (two) times daily. Continue this medication through 10/02/2019. 10/01/19 11/30/19 Yes Tommy Medal, Lavell Islam, MD  ferrous sulfate 325 (65 FE) MG EC tablet Take 325 mg by mouth every evening.   Yes [provider]  furosemide (LASIX) 40 MG tablet Take 40 mg by mouth 2 (two) times daily.   Yes [provider]  levothyroxine (SYNTHROID) 50 MCG tablet Take  50 mcg by mouth daily before breakfast.   Yes [provider]  memantine (NAMENDA) 10 MG tablet TAKE (1) TABLET TWICE A DAY. Patient taking differently: Take 10 mg by mouth 2 (two) times daily.  06/19/18  Yes Ernestina Penna, MD  metoprolol succinate (TOPROL-XL) 25 MG 24 hr tablet Take 1 tablet (25 mg total) by mouth daily. Patient taking differently: Take 25 mg by mouth in the morning.  06/19/18  Yes Ernestina Penna, MD  oxyCODONE (OXY IR/ROXICODONE) 5 MG immediate release tablet Take 1 tablet (5 mg total) by mouth every 6 (six) hours as needed (for pain management). 08/24/19  Yes Gherghe, Daylene Katayama, MD  pantoprazole (PROTONIX) 40 MG tablet Take 1 tablet (40 mg total) by mouth 2 (two) times daily. 08/24/19  Yes Gherghe, Daylene Katayama, MD  potassium chloride SA (K-DUR,KLOR-CON) 20 MEQ tablet Take 1 tablet (20 mEq total) by mouth daily. 06/19/18  Yes Ernestina Penna, MD  senna (SENOKOT) 8.6 MG TABS tablet Take 1 tablet by mouth 2 (two) times daily.    Yes [provider]  acetaminophen (TYLENOL) 500 MG tablet Take 500 mg by mouth every 4 (four) hours as needed for mild pain. Patient not taking: Reported on 10/05/2019    [provider]  furosemide (LASIX) 20 MG tablet TAKE 2 TABLET A DAY AS DIRECTED. Patient  not taking: Reported on 10/05/2019 06/19/18   Ernestina Penna, MD  Spacer/Aero-Holding Deretha Emory DEVI Use with symbicort inhaler BID 06/19/18   Ernestina Penna, MD    Allergies    Simvastatin and Ace inhibitors  Review of Systems   Review of Systems  Unable to perform ROS: Dementia (level 5 caveat)    Physical Exam Updated Vital Signs BP 110/65   Pulse 60   Temp 97.9 F (36.6 C) (Oral)   Resp 12   SpO2 98%   Physical Exam Vitals and nursing note reviewed.  Constitutional:      General: She is not in acute distress.    Appearance: She is well-developed.  HENT:     Head: Normocephalic and atraumatic.  Eyes:     Conjunctiva/sclera: Conjunctivae normal.  Cardiovascular:     Rate and Rhythm: Normal rate and regular rhythm.     Pulses: Normal pulses.  Pulmonary:     Effort: Pulmonary effort is normal.  Abdominal:     Palpations: Abdomen is soft.     Tenderness: There is no abdominal tenderness.  Genitourinary:    Comments: Rectal exam with dark black stool, no active bleed Nurse Guadlupe Spanish in room as chaperone Musculoskeletal:     Cervical back: Neck supple.  Skin:    General: Skin is warm and dry.  Neurological:     Mental Status: She is alert.  Psychiatric:        Mood and Affect: Mood normal.        Behavior: Behavior normal.     ED Results / Procedures / Treatments   Labs (all labs ordered are listed, but only abnormal results are displayed) Labs Reviewed  CBC - Abnormal; Notable for the following components:      Result Value   RBC 2.34 (*)    Hemoglobin 6.5 (*)    HCT 20.1 (*)    RDW 18.9 (*)    nRBC 3.8 (*)    All other components within normal limits  COMPREHENSIVE METABOLIC PANEL - Abnormal; Notable for the following components:   Glucose, Bld 104 (*)    BUN 41 (*)  Creatinine, Ser 2.34 (*)    Calcium 7.8 (*)    Total Protein 4.3 (*)    Albumin 2.0 (*)    AST 61 (*)    ALT 53 (*)    Alkaline Phosphatase 127 (*)    GFR calc non Af Amer 17 (*)     GFR calc Af Amer 20 (*)    All other components within normal limits  POC OCCULT BLOOD, ED  TYPE AND SCREEN  PREPARE RBC (CROSSMATCH)    EKG EKG Interpretation  Date/Time:  Friday October 05 2019 15:52:00 EDT Ventricular Rate:  72 PR Interval:    QRS Duration: 125 QT Interval:  417 QTC Calculation: 447 R Axis:   57 Text Interpretation: Sinus or ectopic atrial rhythm Atrial premature complexes in couplets Short PR interval Left bundle branch block Artifact in lead(s) I II III aVR aVL aVF V1 V2 No STEMI Confirmed by Alvester Chou [phone]) on 10/05/2019 4:54:18 PM   Radiology No results found.  Procedures .Critical Care Performed by: Terald Sleeper, MD Authorized by: Terald Sleeper, MD   Critical care provider statement:    Critical care time (minutes):  45   Critical care was necessary to treat or prevent imminent or life-threatening deterioration of the following conditions:  Circulatory failure   Critical care was time spent personally by me on the following activities:  Discussions with consultants, evaluation of patient's response to treatment, examination of patient, ordering and performing treatments and interventions, ordering and review of laboratory studies, ordering and review of radiographic studies, pulse oximetry, re-evaluation of patient's condition, obtaining history from patient or surrogate and review of old charts Comments:     Symptomatic anemia requiring blood transfusion   (including critical care time)  Medications Ordered in ED Medications  0.9 %  sodium chloride infusion (has no administration in time range)  sodium chloride 0.9 % bolus 1,000 mL (0 mLs Intravenous Stopped 10/05/19 2031)    ED Course  I have reviewed the triage vital signs and the nursing notes.  Pertinent labs & imaging results that were available during my care of the patient were reviewed by me and considered in my medical decision making (see chart for details).]  86 yo  female presenting with anemia, known hx of bleeding gastric ulcers.  I confirmed with her family members their recent decision not to pursue endoscopy or further invasive workup with GI teams for this same problem in the past.  They would prefer symptomatic mgmt and blood transfusions as needed.  I ordered 2 units pRBC to give in the ED today after obtaining consent to do so from patient and family.  After the 2nd unit she was feeling much better, up and to the toilet.  Plan for discharge back with instructions for her facility and doctor to arrange for outpatient transfusions.  Family is aware that her bleeding could accelerate beyond the point of outpatient management, and I have encouraged them to discuss whether they would be prepared for hospice care in that situation.  If they do not want aggressive medical care or procedural interventions, there is little use in repeat hospitalizations.  They will have this conversation going forward.    Final Clinical Impression(s) / ED Diagnoses Final diagnoses:  Iron deficiency anemia due to chronic blood loss    Rx / DC Orders ED Discharge Orders    None       Shann Merrick, Kermit Balo, MD 10/06/19 [phone]

## 2019-10-05 NOTE — ED Notes (Signed)
Blood bank called and said they have 2 units of blood ready for this patient. Notified Rebekah,RN.

## 2019-10-05 NOTE — Discharge Instructions (Addendum)
[name] was diagnosed with anemia in the ER today.  Her bloodwork is included.   She was given 2 units of red blood cells (after her niece and PoA was consented).    I suspect she has internal (GI) bleeding, as she has known bleeding ulcers.  In early June she saw her GI doctor who offered an endoscopy, but the patient and family declined.  After discussion with Carlynn Spry, her niece and PoA, the family decided they would not wish to pursue endoscopy and GI intervention at this time.  They would prefer to transfuse [name] and have her stay out of the hospital.  I explained that she would likely need repeat transfusions every 1-2 weeks if she continues to have bleeding.  Please contact her primary care doctor or GI doctor to discuss setting up repeat blood checks and transfusions as an outpatient- to avoid repeat trips to the ER.    I did recommend they consider hospice care.  They have not made this decision yet.  If her bleeding remains slow and stable, she could be maintained with regular outpatient transfusions.  However, if her bleeding becomes heavier, she may die from this.  I would recommend checking her hemoglobin every 2 days if possible, and continue her iron pills.

## 2019-10-05 NOTE — ED Triage Notes (Signed)
Per [facility] pt from [facility] for a Hgb 6.7. pt been having dark, tarry stools.  Vitals: 95/40, 66HR, 16R, 98% on RA.  Pt has dementia and at her baseline per SNF staff to EMS

## 2019-10-06 ENCOUNTER — Emergency Department (HOSPITAL_COMMUNITY)
Admission: EM | Admit: 2019-10-06 | Discharge: 2019-10-06 | Disposition: A | Discharge status: Home / Self Care | Payer: Medicare Other | Source: Home / Self Care | Attending: Emergency Medicine | Admitting: Emergency Medicine

## 2019-10-06 ENCOUNTER — Emergency Department (HOSPITAL_COMMUNITY): Payer: Medicare Other

## 2019-10-06 ENCOUNTER — Encounter (HOSPITAL_COMMUNITY): Payer: Self-pay | Admitting: Emergency Medicine

## 2019-10-06 DIAGNOSIS — W19XXXA Unspecified fall, initial encounter: Secondary | ICD-10-CM

## 2019-10-06 DIAGNOSIS — S7011XA Contusion of right thigh, initial encounter: Secondary | ICD-10-CM

## 2019-10-06 DIAGNOSIS — S40011A Contusion of right shoulder, initial encounter: Secondary | ICD-10-CM

## 2019-10-06 DIAGNOSIS — S0990XA Unspecified injury of head, initial encounter: Secondary | ICD-10-CM

## 2019-10-06 LAB — BPAM RBC
Blood Product Expiration Date: 202107142359
Blood Product Expiration Date: 202107142359
ISSUE DATE / TIME: 202106111901
ISSUE DATE / TIME: 202106112113
Unit Type and Rh: 5100
Unit Type and Rh: 5100

## 2019-10-06 LAB — TYPE AND SCREEN
ABO/RH(D): O POS
Antibody Screen: NEGATIVE
Unit division: 0
Unit division: 0

## 2019-10-06 LAB — CBC
HCT: 33.8 % — ABNORMAL LOW (ref 36.0–46.0)
Hemoglobin: 11.6 g/dL — ABNORMAL LOW (ref 12.0–15.0)
MCH: 29.2 pg (ref 26.0–34.0)
MCHC: 34.3 g/dL (ref 30.0–36.0)
MCV: 85.1 fL (ref 80.0–100.0)
Platelets: 164 10*3/uL (ref 150–400)
RBC: 3.97 MIL/uL (ref 3.87–5.11)
RDW: 16.7 % — ABNORMAL HIGH (ref 11.5–15.5)
WBC: 9.6 10*3/uL (ref 4.0–10.5)
nRBC: 1.2 % — ABNORMAL HIGH (ref 0.0–0.2)

## 2019-10-06 LAB — COMPREHENSIVE METABOLIC PANEL
ALT: 57 U/L — ABNORMAL HIGH (ref 0–44)
AST: 66 U/L — ABNORMAL HIGH (ref 15–41)
Albumin: 2.4 g/dL — ABNORMAL LOW (ref 3.5–5.0)
Alkaline Phosphatase: 130 U/L — ABNORMAL HIGH (ref 38–126)
Anion gap: 13 (ref 5–15)
BUN: 37 mg/dL — ABNORMAL HIGH (ref 8–23)
CO2: 29 mmol/L (ref 22–32)
Calcium: 8.1 mg/dL — ABNORMAL LOW (ref 8.9–10.3)
Chloride: 98 mmol/L (ref 98–111)
Creatinine, Ser: 1.91 mg/dL — ABNORMAL HIGH (ref 0.44–1.00)
GFR calc Af Amer: 26 mL/min — ABNORMAL LOW (ref >60)
GFR calc non Af Amer: 22 mL/min — ABNORMAL LOW (ref >60)
Glucose, Bld: 80 mg/dL (ref 70–99)
Potassium: 3.5 mmol/L (ref 3.5–5.1)
Sodium: 140 mmol/L (ref 135–145)
Total Bilirubin: 1.2 mg/dL (ref 0.3–1.2)
Total Protein: 4.8 g/dL — ABNORMAL LOW (ref 6.5–8.1)

## 2019-10-06 NOTE — ED Notes (Signed)
[facility] contacted to transport patient to [facility] [place]

## 2019-10-06 NOTE — ED Triage Notes (Signed)
Patient bib gems , patient from [facility]. Patient a/o x2, trying to get from bed to bedside commode and fell. Patient hit head. Denies LOC.  hematoma to right forehead area, patient had oxycodone at facility pta. Patient was evaluated here yesterday for possible GI bleed low hgb. Patient was recommended for hospice care but family declined. Patient has history of AFIB. Denies use of anticoags.

## 2019-10-06 NOTE — ED Notes (Signed)
Unsuccessful IV attempt x 2 by Clinical research associate.

## 2019-10-06 NOTE — ED Notes (Signed)
Patient provided with ice chips (approval from MD obtained)

## 2019-10-06 NOTE — ED Notes (Signed)
Patient still awaiting [facility].  

## 2019-10-06 NOTE — Discharge Instructions (Addendum)
Please use Tylenol as needed for pain Keep head of bed elevated.  Bruising will spread from forehead and may appear to be around the eye.  This is not representing new bleeding but resolution of the prior hematoma and bruising. Return to the emergency department patient appears worse at any time especially return of rectal bleeding.

## 2019-10-06 NOTE — ED Provider Notes (Signed)
[facility] DEPT Provider Note   CSN: [id number] Arrival date & time: 10/06/19  1050     History Chief Complaint  Patient presents with  . Fall    [name] is a 86 y.o. female.  HPI   level 5 caveat secondary to dementia 86 yo female seen yesterday with rectal bleeding and anemia, d/c'd back to facility after conversation with family re goals of care.  Patient fell today and struck head.  No reported loc or syncope.  Patient without complaints at this time. .    Past Medical History:  Diagnosis Date  . Abdominal pain   . Atrophic vaginitis   . Cellulitis 06/2019  . CHF (congestive heart failure) ([place])   . Colon polyps    Dr Watt Climes  . Diverticulosis    Dr Watt Climes  . Essential hypertension, benign   . Gastritis   . Hiatal hernia   . Infection of prosthetic right knee joint ([place]) 10/01/2019  . Insomnia, idiopathic   . Malaise and fatigue   . Osteoarthrosis and allied disorders   . Other and unspecified hyperlipidemia   . Thoracic scoliosis     Patient Active Problem List   Diagnosis Date Noted  . Infection of prosthetic right knee joint ([place]) 10/01/2019  . Long term (current) use of antibiotics 09/06/2019  . Hardware complicating wound infection ([place]) 08/22/2019  . Non-healing surgical wound 08/21/2019  . Duodenitis   . Gastric ulcer without hemorrhage or perforation   . Duodenal ulcer   . Hypotension 08/19/2019  . GI bleed 08/18/2019  . Enteritis 08/18/2019  . AKI (acute kidney injury) ([place]) 08/18/2019  . Chronic kidney disease (CKD), stage III (moderate) 12/18/2018  . Aortic atherosclerosis ([place]) 12/19/2017  . Bronchiectasis without complication ([place]) 29/92/4268  . Hypothyroidism 03/31/2015  . CHF (congestive heart failure) ([place]) 11/15/2014  . Absolute anemia 10/18/2014  . Bruising, spontaneous 09/16/2014  . Long term current use of anticoagulant 09/16/2014  . Atrial fibrillation ([place]) 04/15/2014  . Peripheral edema  04/15/2014  . Nodule of right lung 05/27/2013  . Memory impairment 03/12/2013  . Prophylactic immunotherapy 10/24/2012  . Dyspnea 07/06/2012  . Extrinsic asthma 08/31/2010  . Atrophic vaginitis   . Malaise and fatigue   . Thoracic scoliosis   . Insomnia, idiopathic   . Gastritis   . Gastroesophageal reflux disease with hiatal hernia   . Colon polyps   . Diverticulosis   . Osteoarthrosis and allied disorders   . Hyperlipidemia 02/11/2009  . Essential hypertension 02/10/2009    Past Surgical History:  Procedure Laterality Date  . BIOPSY  08/20/2019   Procedure: BIOPSY;  Surgeon: Lavena Bullion, DO;  Location: [facility];  Service: Gastroenterology;;  . ESOPHAGOGASTRODUODENOSCOPY (EGD) WITH PROPOFOL N/A 08/20/2019   Procedure: ESOPHAGOGASTRODUODENOSCOPY (EGD) WITH PROPOFOL;  Surgeon: Lavena Bullion, DO;  Location: [facility];  Service: Gastroenterology;  Laterality: N/A;  . HEMOSTASIS CLIP PLACEMENT  08/20/2019   Procedure: HEMOSTASIS CLIP PLACEMENT;  Surgeon: Lavena Bullion, DO;  Location: [facility];  Service: Gastroenterology;;  . I & D EXTREMITY Right 08/21/2019   Procedure: IRRIGATION AND DEBRIDEMENT EXTREMITY;  Surgeon: Paralee Cancel, MD;  Location: [facility];  Service: Orthopedics;  Laterality: Right;  . JOINT REPLACEMENT    . left shoulder surgery     . rt knee   10 1 08   Dr Theda Sers  . Rt rotator  cuff repair  4 1 00  . TOTAL HIP ARTHROPLASTY Right 2021  ?  OB History   No obstetric history on file.     Family History  Problem Relation Age of Onset  . Heart disease Father 47  . Cancer Sister     Social History   Tobacco Use  . Smoking status: Former Smoker    Packs/day: 0.10    Years: 5.00    Pack years: 0.50    Types: Cigarettes  . Smokeless tobacco: Never Used  . Tobacco comment: pt states only smoke socially in teens  Vaping Use  . Vaping Use: Never used  Substance Use Topics  . Alcohol use: No  . Drug use: No    Home  Medications Prior to Admission medications   Medication Sig Start Date End Date Taking? Authorizing Provider  acetaminophen (TYLENOL) 325 MG tablet Take 650 mg by mouth in the morning, at noon, and at bedtime.    [provider]  acetaminophen (TYLENOL) 500 MG tablet Take 500 mg by mouth every 4 (four) hours as needed for mild pain. Patient not taking: Reported on 10/05/2019    [provider]  albuterol (PROAIR HFA) 108 (90 Base) MCG/ACT inhaler Inhale 2 puffs into the lungs every 6 (six) hours as needed. Patient taking differently: Inhale 2 puffs into the lungs every 6 (six) hours as needed for wheezing or shortness of breath.  06/19/18   Ernestina Penna, MD  amiodarone (PACERONE) 100 MG tablet Take 100 mg by mouth daily.    [provider]  ARIPiprazole (ABILIFY) 5 MG tablet Take 5 mg by mouth daily.    [provider]  budesonide-formoterol (SYMBICORT) 160-4.5 MCG/ACT inhaler Inhale 2 puffs into the lungs 2 (two) times daily.    [provider]  busPIRone (BUSPAR) 5 MG tablet Take 1 tablet (5 mg total) by mouth at bedtime. 06/19/18   Ernestina Penna, MD  cefdinir (OMNICEF) 300 MG capsule Take 1 capsule (300 mg total) by mouth daily. 10/01/19   Randall Hiss, MD  docusate sodium (COLACE) 100 MG capsule Take 100 mg by mouth daily.    [provider]  donepezil (ARICEPT) 10 MG tablet Take 1 tablet (10 mg total) by mouth daily. Patient taking differently: Take 10 mg by mouth at bedtime.  06/19/18   Ernestina Penna, MD  doxycycline (VIBRAMYCIN) 100 MG capsule Take 1 capsule (100 mg total) by mouth 2 (two) times daily. Continue this medication through 10/02/2019. 10/01/19 11/30/19  Daiva Eves, Lisette Grinder, MD  ferrous sulfate 325 (65 FE) MG EC tablet Take 325 mg by mouth every evening.    [provider]  furosemide (LASIX) 20 MG tablet TAKE 2 TABLET A DAY AS DIRECTED. Patient not taking: Reported on 10/05/2019 06/19/18   Ernestina Penna, MD   furosemide (LASIX) 40 MG tablet Take 40 mg by mouth 2 (two) times daily.    [provider]  levothyroxine (SYNTHROID) 50 MCG tablet Take 50 mcg by mouth daily before breakfast.    [provider]  memantine (NAMENDA) 10 MG tablet TAKE (1) TABLET TWICE A DAY. Patient taking differently: Take 10 mg by mouth 2 (two) times daily.  06/19/18   Ernestina Penna, MD  metoprolol succinate (TOPROL-XL) 25 MG 24 hr tablet Take 1 tablet (25 mg total) by mouth daily. Patient taking differently: Take 25 mg by mouth in the morning.  06/19/18   Ernestina Penna, MD  oxyCODONE (OXY IR/ROXICODONE) 5 MG immediate release tablet Take 1 tablet (5 mg total) by mouth every  6 (six) hours as needed (for pain management). 08/24/19   Leatha Gilding, MD  pantoprazole (PROTONIX) 40 MG tablet Take 1 tablet (40 mg total) by mouth 2 (two) times daily. 08/24/19   Leatha Gilding, MD  potassium chloride SA (K-DUR,KLOR-CON) 20 MEQ tablet Take 1 tablet (20 mEq total) by mouth daily. 06/19/18   Ernestina Penna, MD  senna (SENOKOT) 8.6 MG TABS tablet Take 1 tablet by mouth 2 (two) times daily.     [provider]  Spacer/Aero-Holding Rudean Curt Use with symbicort inhaler BID 06/19/18   Ernestina Penna, MD    Allergies    Simvastatin and Ace inhibitors  Review of Systems   Review of Systems  Physical Exam Updated Vital Signs BP (!) 113/38 (BP Location: Left Arm)   Pulse 63   Temp 98.2 F (36.8 C) (Oral)   Resp 16   Ht 1.626 m (5\' 4" )   Wt 63.5 kg   SpO2 100%   BMI 24.03 kg/m   Physical Exam Vitals and nursing note reviewed.  Constitutional:      General: She is not in acute distress.    Appearance: Normal appearance. She is normal weight. She is not toxic-appearing.  HENT:     Head: Normocephalic.     Comments: Hematoma right forehead    Right Ear: External ear normal.     Left Ear: External ear normal.     Nose: Nose normal.     Mouth/Throat:     Mouth: Mucous membranes are moist.   Eyes:     Extraocular Movements: Extraocular movements intact.     Pupils: Pupils are equal, round, and reactive to light.  Cardiovascular:     Rate and Rhythm: Normal rate and regular rhythm.     Pulses: Normal pulses.     Heart sounds: Normal heart sounds.  Pulmonary:     Effort: Pulmonary effort is normal.     Breath sounds: Normal breath sounds.  Abdominal:     General: Abdomen is flat.     Palpations: Abdomen is soft.  Musculoskeletal:     Cervical back: Normal range of motion.     Comments: Contusion right shoulder for that point tenderness/range of motion Mild diffuse tenderness lateral aspect right lower leg and right hip.  Patient has active range of motion.  She has a well-healed scar over the lateral aspect of the right leg without any evidence of induration or cellulitis  Skin:    General: Skin is warm and dry.     Capillary Refill: Capillary refill takes less than 2 seconds.  Neurological:     General: No focal deficit present.     Mental Status: She is alert. Mental status is at baseline.     Cranial Nerves: No cranial nerve deficit.     Motor: No weakness.  Psychiatric:        Mood and Affect: Mood normal.     ED Results / Procedures / Treatments   Labs (all labs ordered are listed, but only abnormal results are displayed) Labs Reviewed  CBC  COMPREHENSIVE METABOLIC PANEL    EKG EKG Interpretation  Date/Time:  Saturday October 06 2019 11:23:22 EDT Ventricular Rate:  69 PR Interval:    QRS Duration: 290 QT Interval:  581 QTC Calculation: 546 R Axis:   10 Text Interpretation: Atrial fibrillation Ventricular premature complex Probable left ventricular hypertrophy No significant change since last tracing yesterday Confirmed by [id number] [phone]) on 10/06/2019 2:56:02 PM  Radiology DG Shoulder Right  Result Date: 10/06/2019 CLINICAL DATA:  Recent fall, injury, pain EXAM: RIGHT SHOULDER - 2+ VIEW COMPARISON:  09/08/2019 FINDINGS: Limited two-view exam.  Chronic high riding right humeral head in relation to the glenoid compatible with chronic rotator cuff tear. Advanced degenerative arthropathy. No definite acute osseous finding, fracture, or change in alignment. Metallic rotator cuff anchors noted. Bones are osteopenic. IMPRESSION: Limited two-view exam. Chronic right shoulder degenerative arthropathy and osteopenia as above. No acute osseous finding by plain radiography Electronically Signed   By: Judie Petit.  Shick M.D.   On: 10/06/2019 12:20   CT Head Wo Contrast  Result Date: 10/06/2019 CLINICAL DATA:  Recent fall, head trauma, right forehead hematoma EXAM: CT HEAD WITHOUT CONTRAST CT CERVICAL SPINE WITHOUT CONTRAST TECHNIQUE: Multidetector CT imaging of the head and cervical spine was performed following the standard protocol without intravenous contrast. Multiplanar CT image reconstructions of the cervical spine were also generated. COMPARISON:  07/22/2019 FINDINGS: CT HEAD FINDINGS Brain: Stable age related atrophy pattern and white matter microvascular ischemic changes about both lateral ventricles. No acute intracranial hemorrhage, new mass lesion, definite acute infarction, midline shift, herniation, hydrocephalus, or extra-axial fluid collection. No focal mass effect or edema. Cisterns are patent. Cerebellar atrophy as well. Vascular: Intracranial atherosclerosis.  No hyperdense vessel. Skull: Normal. Negative for fracture or focal lesion. Sinuses/Orbits: No acute finding. Other: Small right anterior frontal scalp hematoma noted. CT CERVICAL SPINE FINDINGS Alignment: Stable alignment with mild cervical kyphotic curvature appearing chronic. No subluxation or dislocation. Facets are aligned. Skull base and vertebrae: No acute fracture. No primary bone lesion or focal pathologic process. Soft tissues and spinal canal: No prevertebral fluid or swelling. No visible canal hematoma. Disc levels: Previous C3-4 ACDF. Stable hardware and alignment. Advanced multilevel  cervical degenerative disc disease at all other levels with disc space narrowing, sclerosis and osteophytes. Degenerative changes of the C1-2 articulation as well. No significant interval change. Upper chest: Chronic right apical scarring. Other: Carotid subclavian calcific atherosclerosis noted. Thyroid unremarkable. No soft tissue abnormality. IMPRESSION: Stable atrophy and chronic white matter microvascular changes. Small right anterior frontal scalp hematoma without underlying acute osseous finding or fracture. No acute intracranial abnormality. Stable degenerative changes and previous C3-4 anterior fusion. No interval change or acute osseous finding by CT. Electronically Signed   By: Judie Petit.  Shick M.D.   On: 10/06/2019 12:15   CT Cervical Spine Wo Contrast  Result Date: 10/06/2019 CLINICAL DATA:  Recent fall, head trauma, right forehead hematoma EXAM: CT HEAD WITHOUT CONTRAST CT CERVICAL SPINE WITHOUT CONTRAST TECHNIQUE: Multidetector CT imaging of the head and cervical spine was performed following the standard protocol without intravenous contrast. Multiplanar CT image reconstructions of the cervical spine were also generated. COMPARISON:  07/22/2019 FINDINGS: CT HEAD FINDINGS Brain: Stable age related atrophy pattern and white matter microvascular ischemic changes about both lateral ventricles. No acute intracranial hemorrhage, new mass lesion, definite acute infarction, midline shift, herniation, hydrocephalus, or extra-axial fluid collection. No focal mass effect or edema. Cisterns are patent. Cerebellar atrophy as well. Vascular: Intracranial atherosclerosis.  No hyperdense vessel. Skull: Normal. Negative for fracture or focal lesion. Sinuses/Orbits: No acute finding. Other: Small right anterior frontal scalp hematoma noted. CT CERVICAL SPINE FINDINGS Alignment: Stable alignment with mild cervical kyphotic curvature appearing chronic. No subluxation or dislocation. Facets are aligned. Skull base and  vertebrae: No acute fracture. No primary bone lesion or focal pathologic process. Soft tissues and spinal canal: No prevertebral fluid or swelling. No visible canal hematoma. Disc  levels: Previous C3-4 ACDF. Stable hardware and alignment. Advanced multilevel cervical degenerative disc disease at all other levels with disc space narrowing, sclerosis and osteophytes. Degenerative changes of the C1-2 articulation as well. No significant interval change. Upper chest: Chronic right apical scarring. Other: Carotid subclavian calcific atherosclerosis noted. Thyroid unremarkable. No soft tissue abnormality. IMPRESSION: Stable atrophy and chronic white matter microvascular changes. Small right anterior frontal scalp hematoma without underlying acute osseous finding or fracture. No acute intracranial abnormality. Stable degenerative changes and previous C3-4 anterior fusion. No interval change or acute osseous finding by CT. Electronically Signed   By: Judie PetitM.  Shick M.D.   On: 10/06/2019 12:15   DG Knee Complete 4 Views Right  Result Date: 10/06/2019 CLINICAL DATA:  Fall, pain EXAM: RIGHT KNEE - COMPLETE 4+ VIEW COMPARISON:  08/20/2019 FINDINGS: Chronic right knee arthroplasty changes. Stable hardware and alignment. Bones are osteopenic. No acute osseous finding or displaced fracture. No effusion or soft tissue abnormality. Peripheral atherosclerosis noted. IMPRESSION: Remote right knee arthroplasty changes. Osteopenia No acute finding by plain radiography Electronically Signed   By: Judie PetitM.  Shick M.D.   On: 10/06/2019 12:25   DG Hip Unilat W or Wo Pelvis 2-3 Views Right  Result Date: 10/06/2019 CLINICAL DATA:  Fall, pain EXAM: DG HIP (WITH OR WITHOUT PELVIS) 2-3V RIGHT COMPARISON:  12/18/2018 FINDINGS: Remote right hip arthroplasty changes. No gross malalignment or hardware abnormality. Bones are osteopenic. No acute displaced fracture. Remote right inferior ramus fracture. Degenerative changes of the lower lumbar spine, SI  joints and left hip as before. Nonobstructive bowel gas pattern. IMPRESSION: Chronic postoperative and degenerative changes.  Osteopenia. No definite interval change or acute osseous finding by plain radiography Electronically Signed   By: Judie PetitM.  Shick M.D.   On: 10/06/2019 12:22   DG Femur Min 2 Views Right  Result Date: 10/06/2019 CLINICAL DATA:  Fall, pain EXAM: RIGHT FEMUR 2 VIEWS COMPARISON:  08/20/2019 FINDINGS: Remote right hip arthroplasty and right knee replacement. Chronic appearing postoperative changes. No acute osseous finding or hardware abnormality appreciated by plain radiography. No focal soft tissue swelling. Negative for acute displaced fracture. Peripheral atherosclerosis noted. IMPRESSION: Chronic postoperative findings and osteopenia. No acute finding by plain radiography Peripheral atherosclerosis Electronically Signed   By: Judie PetitM.  Shick M.D.   On: 10/06/2019 12:23    Procedures Procedures (including critical care time)  Medications Ordered in ED Medications - No data to display  ED Course  I have reviewed the triage vital signs and the nursing notes.  Pertinent labs & imaging results that were available during my care of the patient were reviewed by me and considered in my medical decision making (see chart for details).  Clinical Course as of Oct 06 1250  Sat Oct 06, 2019  1240 Reviewed all x-rays and ct scans- no acute    [DR]    Clinical Course User Index [DR] Margarita Grizzleay, Yeilin Zweber, MD   MDM Rules/Calculators/A&P                          Discussed results of work-up with patient's niece who is at bedside.  Patient was here yesterday with rectal bleeding.  She received 2 units of blood.  After discussion with her family, the decision was made to send her home insulinoma to have aggressive GI work-up.  Today, at her SNF, she fell and struck her head.  Evaluation today reveals findings of trauma from the fall, but no evidence of return rectal bleeding.  Rectal exam was  performed there was no stool noted.  Labs were obtained and hemoglobin today is 11.6.  This is consistent with no further rectal bleeding. Her creatinine is slightly elevated at 1.9 but this is decreased from yesterday when it was 2.34. Possible new a fib from prior but appears rate controlled  Final Clinical Impression(s) / ED Diagnoses Final diagnoses:  Fall, initial encounter  Injury of head, initial encounter  Contusion of right shoulder, initial encounter  Contusion of right thigh, initial encounter    Rx / DC Orders ED Discharge Orders    None       Margarita Grizzle, MD 10/06/19 1513

## 2019-11-07 ENCOUNTER — Ambulatory Visit: Payer: Medicare Other | Admitting: Infectious Disease

## 2019-12-12 ENCOUNTER — Ambulatory Visit: Payer: Medicare Other | Admitting: Infectious Disease

## 2021-05-30 IMAGING — DX DG CHEST 1V PORT
1 series · 1 of 1 positions shown · non-contrast
Comparison: Prior chest x-ray 07/22/2019

CLINICAL DATA: [AGE] female with weakness, hypotension.
Evaluate for pneumonia.

EXAM:
PORTABLE CHEST 1 VIEW

[chest]
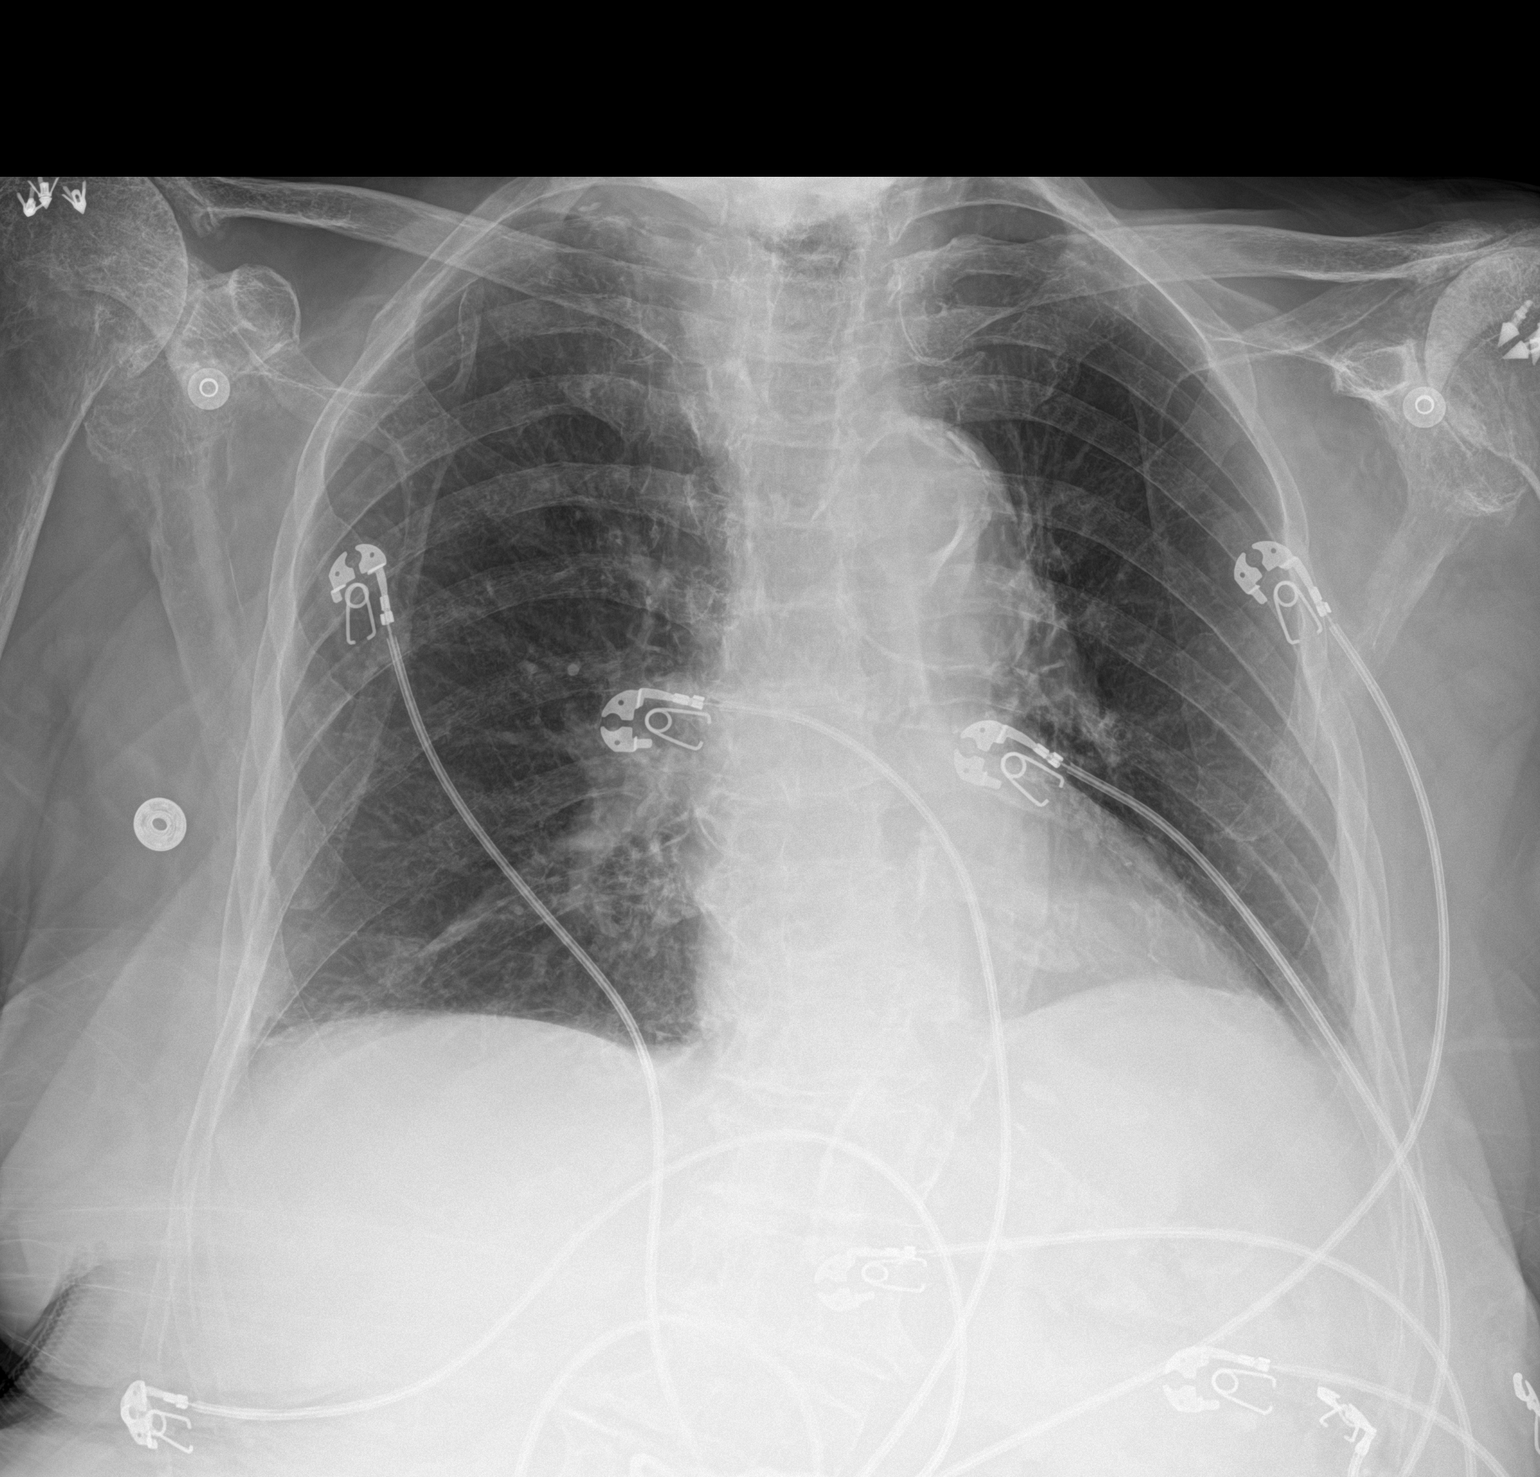

[1 of 1 positions shown; findings below may reference images not displayed]

FINDINGS: Stable cardiac and mediastinal contours. Atherosclerotic
calcifications present throughout the aorta. No focal airspace
opacity to suggest pneumonia. Chronic bronchitic changes remain
stable. Minimal right apical pleuroparenchymal scarring. Evidence of
prior bilateral rotator cuff repair. No acute osseous abnormality.
IMPRESSION: No active disease.

## 2021-07-18 IMAGING — CR DG HIP (WITH OR WITHOUT PELVIS) 2-3V*R*
3 series · 3 of 3 positions shown · non-contrast
Comparison: 12/18/2018

CLINICAL DATA: Fall, pain

EXAM:
DG HIP (WITH OR WITHOUT PELVIS) 2-3V RIGHT

[t pelvis ap]
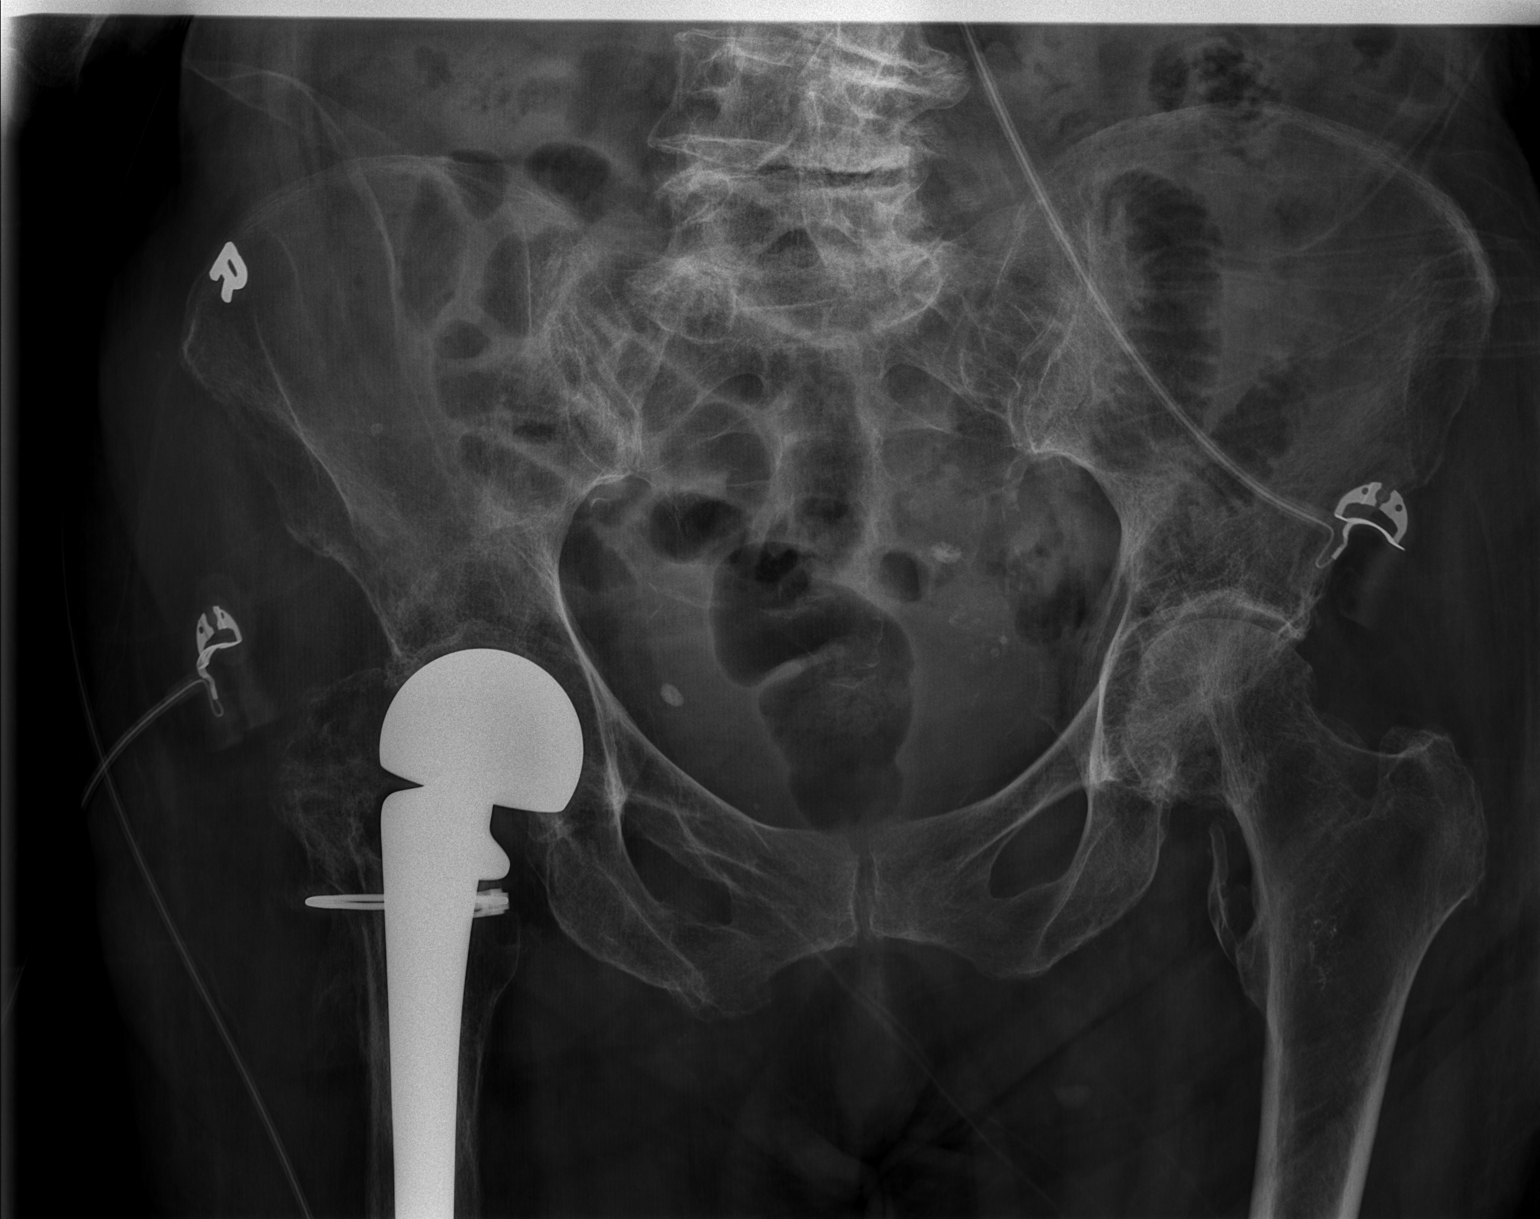

[t hip ap right]
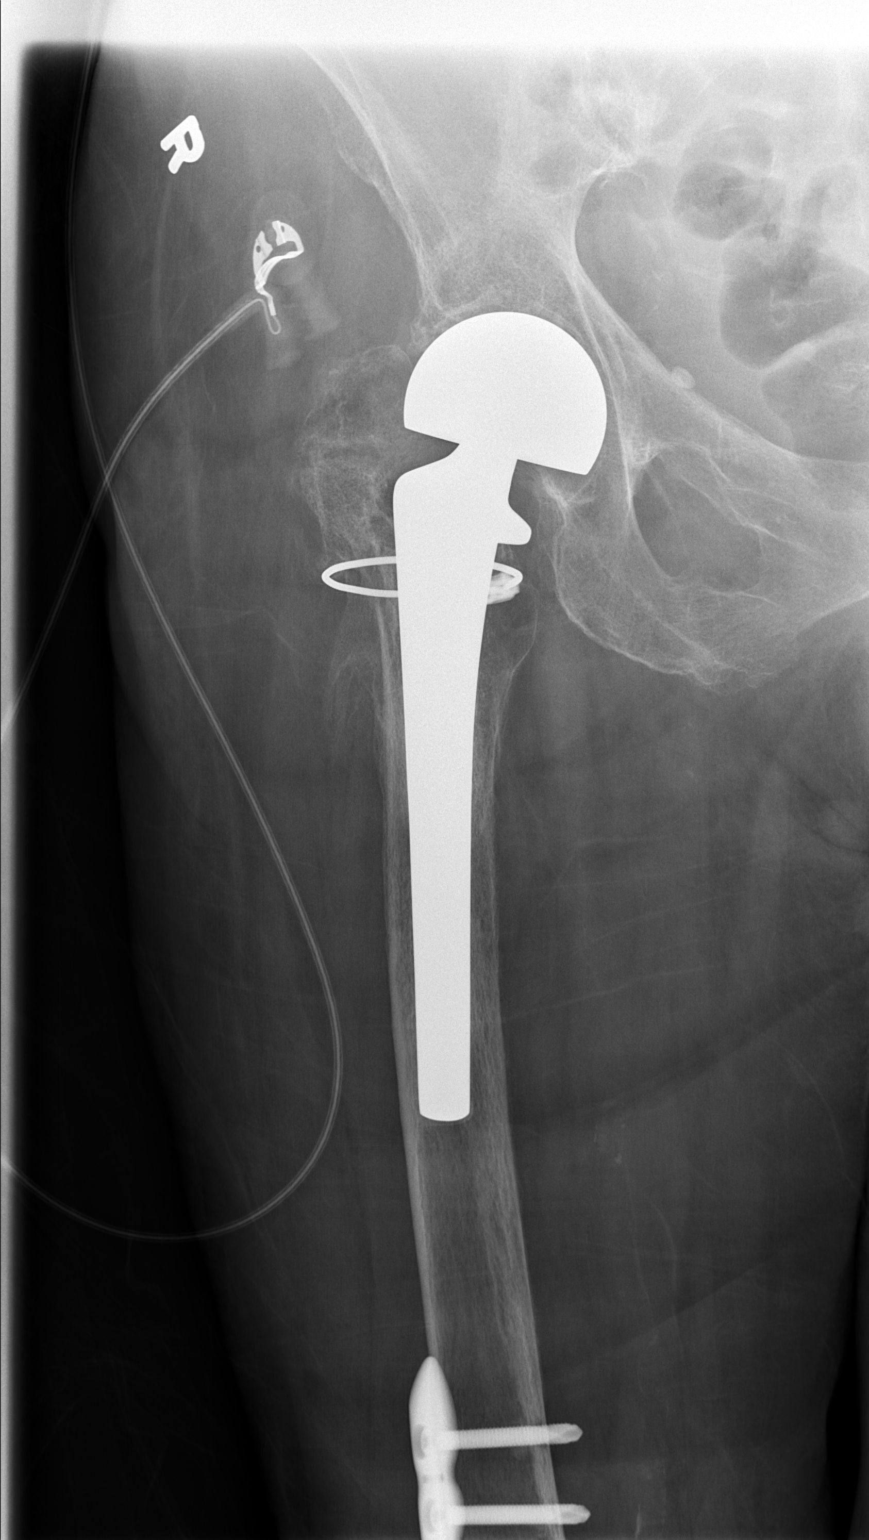

[t hip frog leg right]
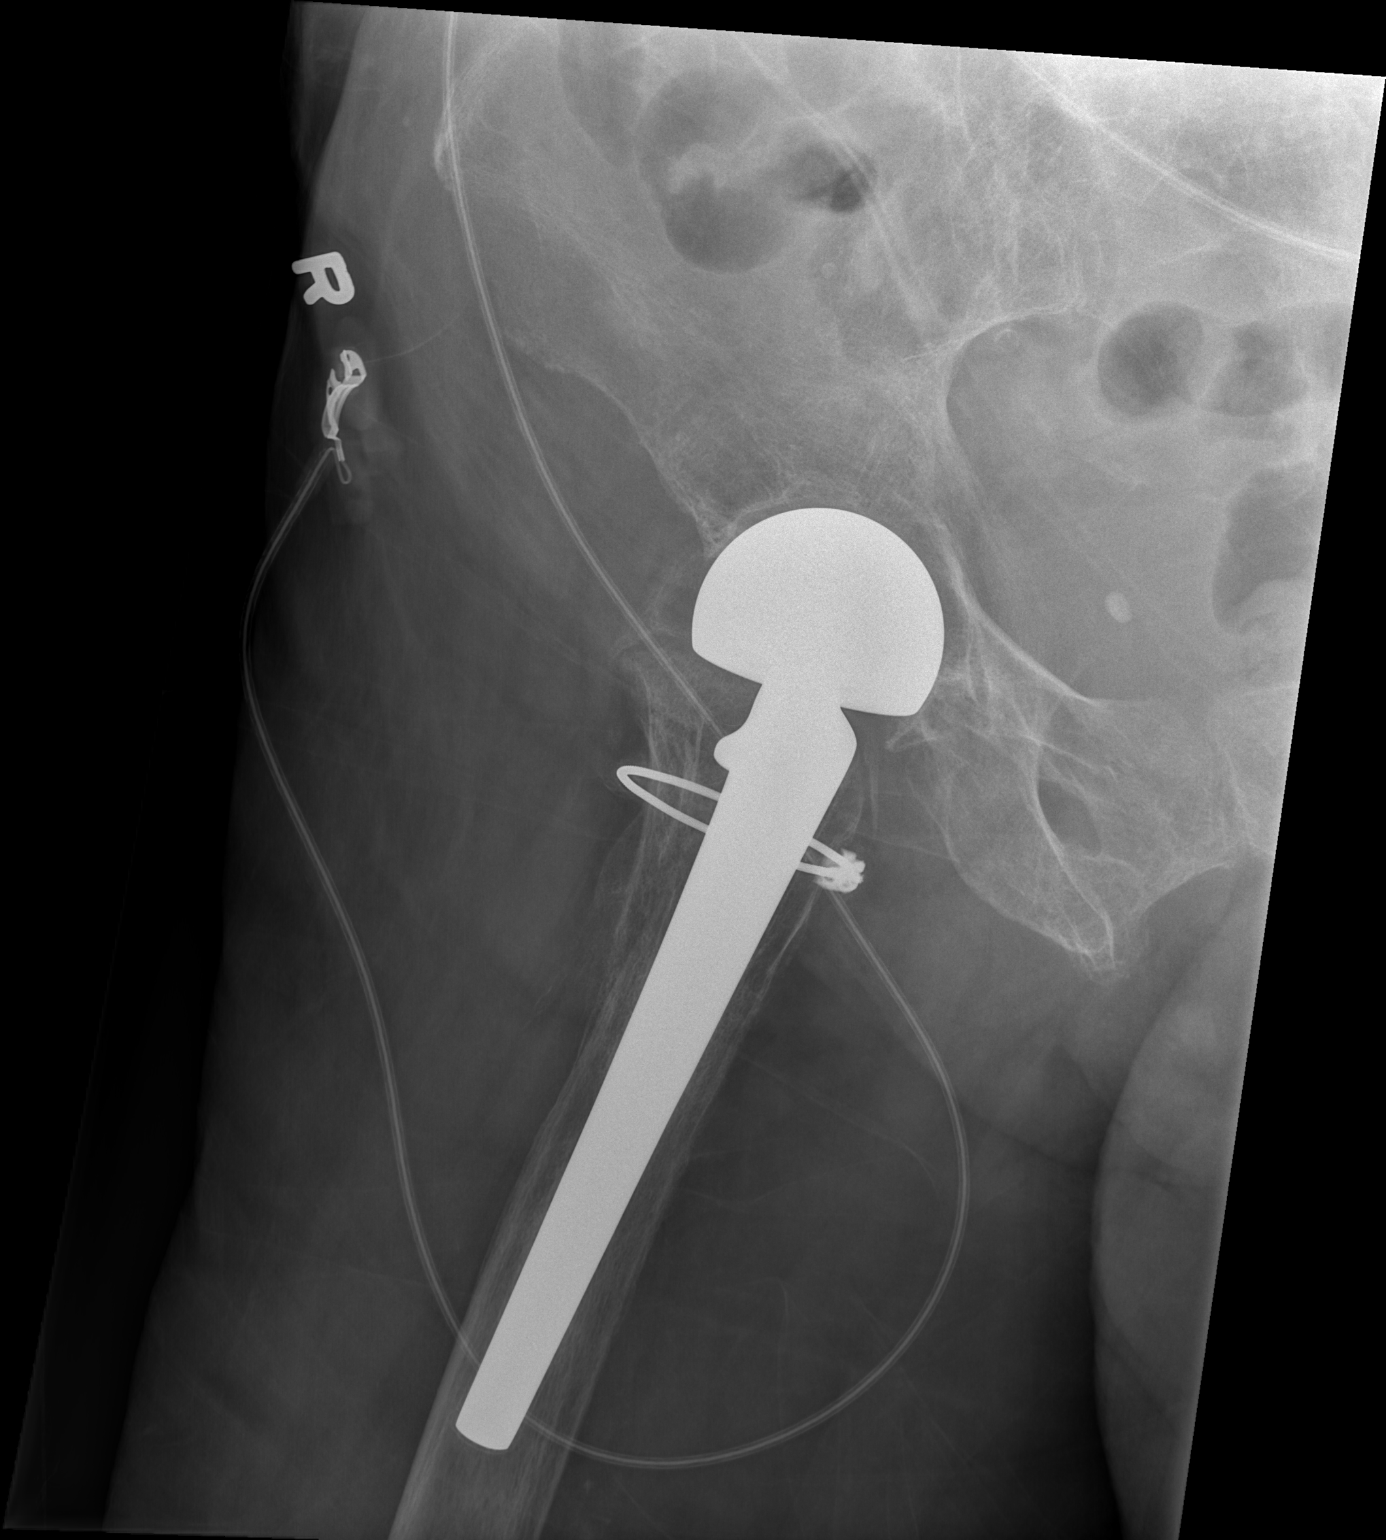

[3 of 3 positions shown; findings below may reference images not displayed]

FINDINGS: Remote right hip arthroplasty changes. No gross malalignment or
hardware abnormality. Bones are osteopenic. No acute displaced
fracture. Remote right inferior ramus fracture. Degenerative changes
of the lower lumbar spine, SI joints and left hip as before.
Nonobstructive bowel gas pattern.
IMPRESSION: Chronic postoperative and degenerative changes.  Osteopenia.

No definite interval change or acute osseous finding by plain
radiography
